# Patient Record
Sex: Female | Born: 1990
Health system: Southern US, Community
[De-identification: ages and names within clinical notes are randomized; demographics above are authoritative.]

## PROBLEM LIST (undated history)

## (undated) DIAGNOSIS — F32A Depression, unspecified: Secondary | ICD-10-CM

## (undated) DIAGNOSIS — Z3A36 36 weeks gestation of pregnancy: Secondary | ICD-10-CM

## (undated) DIAGNOSIS — F419 Anxiety disorder, unspecified: Secondary | ICD-10-CM

## (undated) DIAGNOSIS — E282 Polycystic ovarian syndrome: Secondary | ICD-10-CM

## (undated) DIAGNOSIS — M419 Scoliosis, unspecified: Secondary | ICD-10-CM

## (undated) DIAGNOSIS — N7093 Salpingitis and oophoritis, unspecified: Secondary | ICD-10-CM

## (undated) HISTORY — DX: Anxiety disorder, unspecified: F41.9

## (undated) HISTORY — DX: Depression, unspecified: F32.A

## (undated) HISTORY — DX: Salpingitis and oophoritis, unspecified: N70.93

## (undated) DEATH — deceased

---
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
## 2008-03-03 ENCOUNTER — Ambulatory Visit: Payer: Self-pay | Admitting: Gynecology

## 2008-03-06 ENCOUNTER — Encounter: Admission: RE | Admit: 2008-03-06 | Discharge: 2008-03-06 | Payer: Self-pay | Admitting: Gynecology

## 2008-03-07 ENCOUNTER — Emergency Department (HOSPITAL_COMMUNITY): Admission: EM | Admit: 2008-03-07 | Discharge: 2008-03-07 | Payer: Self-pay | Admitting: Emergency Medicine

## 2008-03-17 ENCOUNTER — Ambulatory Visit: Payer: Self-pay | Admitting: Obstetrics & Gynecology

## 2008-05-19 ENCOUNTER — Emergency Department (HOSPITAL_COMMUNITY): Admission: EM | Admit: 2008-05-19 | Discharge: 2008-05-19 | Payer: Self-pay | Admitting: Emergency Medicine

## 2008-06-14 ENCOUNTER — Emergency Department (HOSPITAL_COMMUNITY): Admission: EM | Admit: 2008-06-14 | Discharge: 2008-06-14 | Payer: Self-pay | Admitting: Emergency Medicine

## 2008-11-15 ENCOUNTER — Encounter (INDEPENDENT_AMBULATORY_CARE_PROVIDER_SITE_OTHER): Payer: Self-pay | Admitting: Gynecology

## 2008-11-15 ENCOUNTER — Ambulatory Visit: Payer: Self-pay | Admitting: Obstetrics & Gynecology

## 2008-11-15 LAB — CONVERTED CEMR LAB
Trich, Wet Prep: NONE SEEN
Yeast Wet Prep HPF POC: NONE SEEN

## 2008-12-20 ENCOUNTER — Ambulatory Visit: Payer: Self-pay | Admitting: Obstetrics & Gynecology

## 2009-01-13 ENCOUNTER — Emergency Department (HOSPITAL_COMMUNITY): Admission: EM | Admit: 2009-01-13 | Discharge: 2009-01-13 | Payer: Self-pay | Admitting: Emergency Medicine

## 2010-12-17 LAB — URINE MICROSCOPIC-ADD ON

## 2010-12-17 LAB — URINALYSIS, ROUTINE W REFLEX MICROSCOPIC
Glucose, UA: NEGATIVE mg/dL
Nitrite: NEGATIVE
Specific Gravity, Urine: 1.031 — ABNORMAL HIGH (ref 1.005–1.030)
pH: 5.5 (ref 5.0–8.0)

## 2010-12-17 LAB — GC/CHLAMYDIA PROBE AMP, GENITAL: GC Probe Amp, Genital: NEGATIVE

## 2010-12-17 LAB — WET PREP, GENITAL: Clue Cells Wet Prep HPF POC: NONE SEEN

## 2010-12-17 LAB — POCT PREGNANCY, URINE: Preg Test, Ur: NEGATIVE

## 2010-12-19 LAB — POCT PREGNANCY, URINE: Preg Test, Ur: NEGATIVE

## 2011-01-21 NOTE — Group Therapy Note (Signed)
NAMEEULENE, PEKAR NO.:  [id number]   MEDICAL RECORD NO.:  [id number]          PATIENT TYPE:  WOC   LOCATION:  [facility]                   FACILITY:  [facility]   PHYSICIAN:  Ginger Carne, MD DATE OF BIRTH:  07/28/1991   DATE OF SERVICE:  03/03/2008                                  CLINIC NOTE   Ms. Myers is a 20 year old nulligravida female who has had her  primary gynecological care at [facility], who presents today  because of irregular menses.  Also, she complains of left breast  thickening at the 3 o'clock position.  Since the age of 89, when her  menarche began, she has had her regular menses.  Her last menses was in  December 2008.  She denies galactorrhea, headaches, or other previous  diagnoses made about her amenorrheic cycles.  Although, she denies  symptomatology of excessive hair growth, she does state that she has  frequent boils in the axilla and groin area.  The patient has not been  formally treated for irregular menses.  She was offered oral  contraceptive agents at [facility] in April of this year.  However,  she declined its use because she did not wish to take pills on a daily  basis.   OB/GYN HISTORY:  The patient is nulligravida.   ALLERGIES:  Penicillin.   CONTRACEPTIVES MEDICATIONS:  None.   CURRENT MEDICATIONS:  None.   PAST SURGICAL HISTORY:  None.   PAST MEDICAL HISTORY:  None.   SOCIAL HISTORY:  The patient smokes about 1 pack of cigarettes daily and  denies alcohol or illicit drug abuse.   FAMILY HISTORY:  No first degree relatives with breast, colon, ovarian,  or uterine carcinoma.  Her mother has type 2 diabetes mellitus.   REVIEW OF SYSTEMS:  A 14-point comprehensive review of systems is within  normal limits.   PHYSICAL EXAMINATION:  VITAL SIGNS:  Blood pressure 123/80, weight 240  pounds, height 5 feet 3-1/2-inches, pulse 82 and regular.  Temperature  98.0.  HEENT:  Grossly normal.  BREAST:   Reveals no obvious thickenings of either breast.  The patient,  at this time, states that she cannot palpate a left lateral breast  thickening.  She states that she has felt this thickening yesterday.  Otherwise, there is no evidence for masses, discharge, thickenings, or  tenderness bilaterally.  CHEST:  Clear to percussion and auscultation.  CARDIAC:  Without murmurs or enlargements.  Regular rate and rhythm.  EXTREMITIES, LYMPHATICS, SKIN, NEUROLOGICAL, AND MUSCULOSKELETAL  SYSTEMS:  Normal.  There is no evidence for gross hirsutism.  However,  there is presence of groin boils.  These are not pustular, however, they  are indurated.  PELVIC EXAM:  The patient had a Pap smear at [facility].  Wet prep  obtained, due to patient stating she has had a discharge recently.  External genitalia, vulva, and vagina are normal.  Cervix is smooth  without erosions or lesions.  Uterus is difficult to palpate, but  appears normal as to both adnexa.   IMPRESSION:  1. Questionable left breast thickening.  A pelvic sonogram was ordered  to identify any possibility of masses with thickenings at the 3      o'clock position of the left breast.  2. Irregular menstrual cycles.  The patient appears to have polycystic      ovarian syndrome, but has not been appropriately worked up today.      I have ordered the 17-hydroxyprogesterone level, total testosterone      level, TSH, prolactin, and a serum HCG.  I have held off ordering a      insulin-resistance study at this time.   PLAN:  I discussed with the patient the importance of having regular  menses and risks of uterine cancer to down the road.  At this point, she  is not ready to start oral contraceptives, but I have asked her to think  about it and return in 2 weeks after her testings is back to discuss  further options.  I am uncomfortable giving her every other month  progesterone for withdrawal bleeds due to not having a good method of   contraception.  She might be a candidate for Mirena IUD, if she does not  wish to engage in a contraceptive management.           ______________________________  Ginger Carne, MD     SHB/MEDQ  D:  03/03/2008  T:  03/04/2008  Job:  [id number]

## 2011-01-21 NOTE — Group Therapy Note (Signed)
NAMECAMRYN, Brittany Lindsey            ACCOUNT NO.:  [id number]   MEDICAL RECORD NO.:  [id number]          PATIENT TYPE:  WOC   LOCATION:  [facility]                   FACILITY:  [facility]   PHYSICIAN:  Allie Bossier, MD        DATE OF BIRTH:  1991-05-02   DATE OF SERVICE:  11/15/2008                                  CLINIC NOTE   Marthella is a 20 year old single Hispanic gravida zero who comes in  complaining of a watery vaginal discharge.  She says it is almost like  sweat, she describes it as having a bad odor.  Her second complaint is  that of decreased libido for the last year.  Workup for her  oligomenorrhea included a testosterone level that was elevated at 58  (upper limit of normal is 40).  The remainder of her hormone workup for  that issue was within normal limits.  On exam she certainly does have a  watery discharge, I do not appreciate a foul odor.  There is no  leukorrhea.  I did send a Wet prep and GC and Chlamydia cultures.  Also  when I did a review of systems it is apparent that she only had 4  periods last year.  She has not been sexually active since November of  2009 but she is in a relationship with a fellow/boyfriend.   ASSESSMENT AND PLAN:  1. Watery vaginal discharge.  I have been cultures and a Wet prep,      results will be followed up as soon as they are available.  2. Oligomenorrhea.  I have discussed with her that she needs to have      at least 6 periods per year to prevent uterine hyperplasia and      possible development of uterine cancer in the future.  I therefore      placed her on Yaz, given her a prescription for Yaz.  I have also      counseled her about not using ibuprofen while on this medicine.      The Dianah Field will serve 3 effects in my mind, it will hopefully bring      her testosterone back into the normal range, it will provide birth      control should she begin having sexual intercourse and number three      is will cause her to have withdrawal  bleeding and thereby decrease      her risk of uterine hyperplasia.  She will come back next month for      a blood pressure check.      Allie Bossier, MD     MCD/MEDQ  D:  11/15/2008  T:  11/16/2008  Job:  [id number]

## 2011-01-21 NOTE — Group Therapy Note (Signed)
NAMETANEIA, MEALOR            ACCOUNT NO.:  [id number]   MEDICAL RECORD NO.:  [id number]          PATIENT TYPE:  WOC   LOCATION:  [facility]                   [facility]AC[facility]   PHYSICIAN:  Elsie Lincoln, MD      DATE OF BIRTH:  1991/01/15   DATE OF SERVICE:                                  CLINIC NOTE   REASON FOR VISIT:  A 2-week followup for some laboratory values from  amenorrhea.   SUBJECTIVE:  Brittany Lindsey is a 20 year old female who has not had a regular  period since approximately December 2008.  She presents today for  followup of her laboratory assessment and ultrasound to discuss options  to get her periods regular.  She currently denies trying to get  pregnant.  She does endorse some vaginal dryness and poor libido at this  time.  She also addresses feeling somewhat down.  Otherwise, she has no  complaints today.  She is interested in talking about her options to get  her periods more regular and she is interested in both oral  contraceptives and Mirena IUD.   OBJECTIVE:  Temperature is 97.0, pulse 77, blood pressure 124/79, weight  is 240.5 pounds, height 5 feet 3-1/2 inches, and respiratory rate of 20.  She is a pleasant, overweight female in no apparent distress.   ASSESSMENT:  Ms. Robers is a 20 year old female with amenorrhea with  need for withdrawal bleeding.   PLAN:  We have discussed the options for the patient and she is  interested in starting birth control pill at this time.  We would like  her to start Yasmin, as just a monophasic pill.  We will have the  patient follow up in 2 to 3 months to make sure this is going well for  her.  She asks what she should do should she not have a withdrawal bleed  on this and it was recommended that she call the clinic to let them  know, so perhaps she could be seen sooner.  She was apprised that she  needs to take this pill at the same time everyday.           ______________________________  Elsie Lincoln,  MD     KL/MEDQ  D:  03/17/2008  T:  03/18/2008  Job:  [id number]

## 2011-01-21 NOTE — Group Therapy Note (Signed)
Brittany Lindsey, Brittany Lindsey            ACCOUNT NO.:  [id number]   MEDICAL RECORD NO.:  [id number]          PATIENT TYPE:  WOC   [facility]ATION[facility]                   [facility]ACILITY:  [facility]   PHYSICIAN:  Caren Griffins, CNM       DATE OF BIRTH:  24-Apr-1991   DATE OF SERVICE:  12/20/2008                                  CLINIC NOTE   REASON FOR VISIT:  Follow-up blood pressure and  follow-up  oligomenorrhea.   HISTORY:  This is a 20 year old nulliparous female who was seen by Dr.  Marice Potter about a month ago for a vaginal discharge that she had described as  watery and malodorous.  Also, she had been having oligomenorrhea worked  up by [place] starting in July 2009.  At that time, she was initially started  on Yasmin.  She states she is now taking Yaz.  She is also concerned  about decreased libido which has been going on for at least a year. She  is very busy going to school and has been in a mutually monogamous  relationship for the past few months although she has had quite a few  lifetime partners.  She denies any vaginal discharge or malodor.  She  did have a normal period on December 16, 2008.  She last had intercourse  during her menses and states that she began having pain with deep  penetration and also when she was using a tampon.  There was a local  area that was uncomfortable deep in the vagina and over that same  weekend she had dyspareunia and describes that it felt like he hit  something again deep in the vagina.  She states that it was the usual  position for her which was supine.  Her last Pap was normal December 15, 2008 at [facility].  She states all her Paps have been normal and she  has had several, but this history is uncertain and she is only 20 years  old.  She has a family history of diabetes in her mother and grandfather  and would like to be checked for diabetes herself.   OBJECTIVE:  BP 124/80, weight is 235 pounds, height 5 foot and 3-1/2  inches.  GENERAL:  Obese, NAD.  No  facial hirsutism.  ABDOMEN:  Obese, nontender.  PELVIC:  Shaved mons.  NEFG.  BUS negative.  Vagina:  No lesions or  masses.  The speculum is rotated.  She does have mild discomfort from  the speculum blade anteriorly.  Cervix is clean.  Physiologic discharge.  Uterus:  No tenderness or masses. Adnexa:  No tenderness or masses.   ASSESSMENT:  1. Obesity and oligomenorrhea responding to Yaz.  2. Normal pelvic exam with subjective dyspareunia and decrease in      libido.  3. Obesity.   PLAN:  Discussed relationship issues and ways to decrease stress.  Recommended more exercise and weight reduction diet.  As requested, we  scheduled her for a 2-hour GTT.  She is advised to either not shave her  mons and  vulva or to shave in a downward motion.  She is reassured that  her  pelvic exam is essentially normal and she is advised to try a vaginal  lubricant and continue on Yaz.  Follow up here in about 6 months to see  how she is doing.           ______________________________  Caren Griffins, CNM     DP/MEDQ  D:  12/20/2008  T:  12/20/2008  Job:  [id number]

## 2011-06-13 LAB — CBC
Hemoglobin: 12.8
MCHC: 34.8
RBC: 4.48
RDW: 13.1

## 2011-06-13 LAB — URINALYSIS, ROUTINE W REFLEX MICROSCOPIC
Glucose, UA: NEGATIVE
Hgb urine dipstick: NEGATIVE
Specific Gravity, Urine: 1.034 — ABNORMAL HIGH
pH: 7

## 2011-06-13 LAB — WET PREP, GENITAL
Clue Cells Wet Prep HPF POC: NONE SEEN
Trich, Wet Prep: NONE SEEN

## 2011-06-13 LAB — PREGNANCY, URINE: Preg Test, Ur: NEGATIVE

## 2011-06-13 LAB — DIFFERENTIAL
Eosinophils Absolute: 0.1 — ABNORMAL LOW
Lymphocytes Relative: 24
Lymphs Abs: 2.4
Monocytes Absolute: 0.9
Neutrophils Relative %: 66

## 2011-06-13 LAB — URINE MICROSCOPIC-ADD ON

## 2011-06-13 LAB — URINE CULTURE: Colony Count: NO GROWTH

## 2011-06-13 LAB — RPR: RPR Ser Ql: NONREACTIVE

## 2012-07-04 ENCOUNTER — Encounter (HOSPITAL_COMMUNITY): Payer: Self-pay

## 2012-07-04 ENCOUNTER — Emergency Department (HOSPITAL_COMMUNITY)
Admission: EM | Admit: 2012-07-04 | Discharge: 2012-07-04 | Disposition: A | Discharge status: Home / Self Care | Payer: Self-pay | Attending: Emergency Medicine | Admitting: Emergency Medicine

## 2012-07-04 DIAGNOSIS — Y939 Activity, unspecified: Secondary | ICD-10-CM | POA: Insufficient documentation

## 2012-07-04 DIAGNOSIS — L02519 Cutaneous abscess of unspecified hand: Secondary | ICD-10-CM | POA: Insufficient documentation

## 2012-07-04 DIAGNOSIS — L03019 Cellulitis of unspecified finger: Secondary | ICD-10-CM | POA: Insufficient documentation

## 2012-07-04 DIAGNOSIS — W57XXXA Bitten or stung by nonvenomous insect and other nonvenomous arthropods, initial encounter: Secondary | ICD-10-CM

## 2012-07-04 DIAGNOSIS — L0390 Cellulitis, unspecified: Secondary | ICD-10-CM

## 2012-07-04 DIAGNOSIS — Y929 Unspecified place or not applicable: Secondary | ICD-10-CM | POA: Insufficient documentation

## 2012-07-04 DIAGNOSIS — L089 Local infection of the skin and subcutaneous tissue, unspecified: Secondary | ICD-10-CM | POA: Insufficient documentation

## 2012-07-04 MED ORDER — HYDROCODONE-ACETAMINOPHEN 5-325 MG PO TABS
1.0000 | ORAL_TABLET | Freq: Once | ORAL | Status: AC
  Administered 2012-07-04: 1 via ORAL
  Filled 2012-07-04: qty 1

## 2012-07-04 MED ORDER — ONDANSETRON 4 MG PO TBDP
ORAL_TABLET | ORAL | Status: AC
  Administered 2012-07-04: 4 mg
  Filled 2012-07-04: qty 1

## 2012-07-04 MED ORDER — CLINDAMYCIN HCL 150 MG PO CAPS: 450.0000 mg | ORAL_CAPSULE | Freq: Three times a day (TID) | ORAL | Status: DC

## 2012-07-04 MED ORDER — SULFAMETHOXAZOLE-TRIMETHOPRIM 800-160 MG PO TABS: 1.0000 | ORAL_TABLET | Freq: Two times a day (BID) | ORAL | Status: DC

## 2012-07-04 MED ORDER — IBUPROFEN 800 MG PO TABS: 800.0000 mg | ORAL_TABLET | Freq: Three times a day (TID) | ORAL | Status: DC

## 2012-07-04 NOTE — ED Provider Notes (Signed)
Medical screening examination/treatment/procedure(s) were performed by non-physician practitioner and as supervising physician I was immediately available for consultation/collaboration.   Maribella Kuna, MD 07/04/12 1549 

## 2012-07-04 NOTE — ED Notes (Signed)
Patient reports that she had right hand pain 3 days ago and Saturday patient noted that her right hand itched, slight redness and swelling noted. Patient states she now has pain that radiates to her shoulder and the pain is worse with movement. Patient has a radial pulse and is able to wiggle fingers, but is unable to make a fist.

## 2012-07-04 NOTE — ED Provider Notes (Signed)
History     CSN: [id number]  Arrival date & time 07/04/12  1058   First MD Initiated Contact with Patient 07/04/12 1159      Chief Complaint  Patient presents with  . Arm Pain  . Hand Pain    (Consider location/radiation/quality/duration/timing/severity/associated sxs/prior treatment) HPI Comments: Patient presents with right hand pain s/p insect bite. Patient states that she noticed "two small holes" inferior to her right 5th digit. Associated symptoms include redness and swelling of the 5th digit, itching,and pain. Patient rates the pain as 6/10 without radiation or transmission. Denies fever or chills. Denies numbness or tingling.   The history is provided by the patient. No language interpreter was used.    History reviewed. No pertinent past medical history.  History reviewed. No pertinent past surgical history.  Family History  Problem Relation Age of Onset  . Diabetes Mother   . Seizures Mother   . Hyperlipidemia Mother     History  Substance Use Topics  . Smoking status: Never Smoker   . Smokeless tobacco: Never Used  . Alcohol Use: No    OB History    Grav Para Term Preterm Abortions TAB SAB Ect Mult Living                  Review of Systems  Constitutional: Negative for fever and chills.  Musculoskeletal: Positive for arthralgias.  Skin: Positive for color change and wound.  Neurological: Negative for numbness.    Allergies  Penicillins  Home Medications   Current Outpatient Rx  Name Route Sig Dispense Refill  . CLINDAMYCIN HCL 150 MG PO CAPS Oral Take 3 capsules (450 mg total) by mouth 3 (three) times daily. May dispense as 150mg  capsules 30 capsule 0  . IBUPROFEN 800 MG PO TABS Oral Take 1 tablet (800 mg total) by mouth 3 (three) times daily. 21 tablet 0  . SULFAMETHOXAZOLE-TRIMETHOPRIM 800-160 MG PO TABS Oral Take 1 tablet by mouth every 12 (twelve) hours. 20 tablet 0    BP 125/76  Pulse 80  Temp 98.4 F (36.9 C) (Oral)  Resp 14  SpO2  99%  LMP 07/04/2012  Physical Exam  Nursing note and vitals reviewed. Constitutional: She appears well-developed and well-nourished. No distress.  HENT:  Head: Normocephalic and atraumatic.  Mouth/Throat: Oropharynx is clear and moist.  Eyes: Pupils are equal, round, and reactive to light. No scleral icterus.  Neck: Neck supple.  Cardiovascular: Normal rate, regular rhythm and normal heart sounds.   Pulmonary/Chest: Effort normal and breath sounds normal.  Abdominal: Soft. Bowel sounds are normal. There is no tenderness.  Musculoskeletal: Normal range of motion. She exhibits edema and tenderness.       Hands:      Patient has erythema and swelling of the 5th digit. There are two small puncture wounds inferior to the 5th digit. Strong radial pulse with good cap refill.  Neurological: She is alert.  Skin: Skin is warm.    ED Course  Procedures (including critical care time)  Labs Reviewed - No data to display No results found.   1. Cellulitis   2. Insect bite       MDM  Patient presented s/p insect bite to right hand. Erythema and swelling noted at the 5th digit superior to bite. Neurovascularly intact. Pain improved with pain meds in ED. Area concerning for cellulitis. Patient discharged on bactrim and keflex with return precautions.        Pixie Casino, PA-C 07/04/12 1429

## 2012-09-27 ENCOUNTER — Emergency Department (HOSPITAL_COMMUNITY)
Admission: EM | Admit: 2012-09-27 | Discharge: 2012-09-27 | Disposition: A | Discharge status: Home / Self Care | Payer: Self-pay | Attending: Emergency Medicine | Admitting: Emergency Medicine

## 2012-09-27 ENCOUNTER — Emergency Department (HOSPITAL_COMMUNITY): Payer: Self-pay

## 2012-09-27 ENCOUNTER — Encounter (HOSPITAL_COMMUNITY): Payer: Self-pay | Admitting: *Deleted

## 2012-09-27 DIAGNOSIS — R110 Nausea: Secondary | ICD-10-CM | POA: Insufficient documentation

## 2012-09-27 DIAGNOSIS — Z79899 Other long term (current) drug therapy: Secondary | ICD-10-CM | POA: Insufficient documentation

## 2012-09-27 DIAGNOSIS — R0602 Shortness of breath: Secondary | ICD-10-CM | POA: Insufficient documentation

## 2012-09-27 DIAGNOSIS — R079 Chest pain, unspecified: Secondary | ICD-10-CM | POA: Insufficient documentation

## 2012-09-27 DIAGNOSIS — Z3202 Encounter for pregnancy test, result negative: Secondary | ICD-10-CM | POA: Insufficient documentation

## 2012-09-27 DIAGNOSIS — N926 Irregular menstruation, unspecified: Secondary | ICD-10-CM | POA: Insufficient documentation

## 2012-09-27 LAB — COMPREHENSIVE METABOLIC PANEL
ALT: 22 U/L (ref 0–35)
Alkaline Phosphatase: 50 U/L (ref 39–117)
BUN: 9 mg/dL (ref 6–23)
CO2: 27 mEq/L (ref 19–32)
Chloride: 104 mEq/L (ref 96–112)
GFR calc Af Amer: >90 mL/min (ref >90)
Glucose, Bld: 105 mg/dL — ABNORMAL HIGH (ref 70–99)
Potassium: 4.1 mEq/L (ref 3.5–5.1)
Sodium: 140 mEq/L (ref 135–145)
Total Bilirubin: 0.4 mg/dL (ref 0.3–1.2)
Total Protein: 7.6 g/dL (ref 6.0–8.3)

## 2012-09-27 LAB — URINALYSIS, ROUTINE W REFLEX MICROSCOPIC
Bilirubin Urine: NEGATIVE
Glucose, UA: NEGATIVE mg/dL
Hgb urine dipstick: NEGATIVE
Ketones, ur: NEGATIVE mg/dL
Protein, ur: NEGATIVE mg/dL
Urobilinogen, UA: 0.2 mg/dL (ref 0.0–1.0)

## 2012-09-27 LAB — CBC WITH DIFFERENTIAL/PLATELET
Basophils Absolute: 0.1 10*3/uL (ref 0.0–0.1)
Lymphs Abs: 2.3 10*3/uL (ref 0.7–4.0)
MCH: 28 pg (ref 26.0–34.0)
MCHC: 33.3 g/dL (ref 30.0–36.0)
MCV: 84 fL (ref 78.0–100.0)
Monocytes Absolute: 0.6 10*3/uL (ref 0.1–1.0)
Neutro Abs: 4.5 10*3/uL (ref 1.7–7.7)
Neutrophils Relative %: 59 % (ref 43–77)
Platelets: 287 10*3/uL (ref 150–400)
RDW: 12.3 % (ref 11.5–15.5)

## 2012-09-27 NOTE — ED Notes (Signed)
Pt reports no period x 3 months, has taken pregnancy tests with negative results. ALso reports bloating in abdomen. Abdominal cramping off and on for past few months.

## 2012-09-27 NOTE — ED Provider Notes (Signed)
History  This chart was scribed for Glynn Octave, MD by Bennett Scrape, ED Scribe. This patient was seen in room WA17/WA17 and the patient's care was started at 4:03 PM.  CSN: [id number]  Arrival date & time 09/27/12  1338   First MD Initiated Contact with Patient 09/27/12 1603      Chief Complaint  Patient presents with  . Abdominal Cramping    The history is provided by the patient. No language interpreter was used.    Brittany Lindsey is a 22 y.o. female who presents to the Emergency Department complaining of 3 months of gradual onset, gradually worsening, constant umbilical abdominal pain described as fullness that occasional radiates to the LLQ with associated occasional nausea, SOB and intermittent CP. She reports that the fullness is worse with standing. She denies having CP and SOB currently. She states that her last period was in October 3013 but reports that she has taken 4 at home pregnancy tests, the last one she took this morning, that were negative. She denies having prior episodes of similar symptoms.She states that she has been moving her bowels normally and has been eating and drinking normally. She denies nausea, emesis, diarrhea, constipation, vaginal bleeding or discharge, urinary symptoms, and abdominal pain as associated symptoms. She denies being on birth control or taking daily medications. She states that she had a hormone imbalance with high testosterone 5 years ago and states that she was on birth control but states that she stopped following up with her PCP and went off the medication. She denies any prior abdominal surgeries. She reports that she is G0P0. She does not have a h/o chronic medical conditions and denies smoking and alcohol use.   No current PCP.  History reviewed. No pertinent past medical history.  History reviewed. No pertinent past surgical history.  Family History  Problem Relation Age of Onset  . Diabetes Mother   . Seizures Mother   .  Hyperlipidemia Mother     History  Substance Use Topics  . Smoking status: Never Smoker   . Smokeless tobacco: Never Used  . Alcohol Use: No    No OB history provided.  Review of Systems  A complete 10 system review of systems was obtained and all systems are negative except as noted in the HPI and PMH.   Allergies  Mushroom extract complex and Penicillins  Home Medications  No current outpatient prescriptions on file.  Triage Vitals: BP 133/52  Pulse 85  Temp 98.4 F (36.9 C) (Oral)  Resp 16  SpO2 100%  Physical Exam  Nursing note and vitals reviewed. Constitutional: She is oriented to person, place, and time. She appears well-developed and well-nourished. No distress.  HENT:  Head: Normocephalic and atraumatic.  Mouth/Throat: Oropharynx is clear and moist.  Eyes: Conjunctivae normal and EOM are normal. Pupils are equal, round, and reactive to light.  Neck: Neck supple. No tracheal deviation present.  Cardiovascular: Normal rate, regular rhythm and intact distal pulses.   Pulmonary/Chest: Effort normal and breath sounds normal. No respiratory distress.  Abdominal: Soft. Bowel sounds are normal. There is no tenderness.       No appreciable hernias, no CVA tenderness  Musculoskeletal: Normal range of motion. She exhibits no edema.  Neurological: She is alert and oriented to person, place, and time.  Skin: Skin is warm and dry.  Psychiatric: She has a normal mood and affect. Her behavior is normal.    ED Course  Procedures (including critical care time)  DIAGNOSTIC  STUDIES: Oxygen Saturation is 100% on room air, normal by my interpretation.    COORDINATION OF CARE: 4:13 PM- Informed pt of negative pregnancy test in the ED today. Discussed treatment plan which includes CXR, CBC panel, and UA with pt at bedside and pt agreed to plan.   5:17 PM- Advised pt of radiology and lab work results. Discussed discharge plan with pt and pt agreed to plan. Also advised pt to  follow up with Gynecology and pt agreed.  Labs Reviewed  COMPREHENSIVE METABOLIC PANEL - Abnormal; Notable for the following:    Glucose, Bld 105 (*)     All other components within normal limits  URINALYSIS, ROUTINE W REFLEX MICROSCOPIC  CBC WITH DIFFERENTIAL  POCT PREGNANCY, URINE  HCG, QUANTITATIVE, PREGNANCY  LIPASE, BLOOD   Dg Abd Acute W/chest  09/27/2012  *RADIOLOGY REPORT*  Clinical Data: Nausea and abdominal pain.  ACUTE ABDOMEN SERIES (ABDOMEN 2 VIEW & CHEST 1 VIEW)  Comparison: CT of the abdomen on 12/10/2004  Findings: The lungs are clear and the heart size is normal.  There is a leftward convex scoliosis of the thoracic spine.  The abdominal films show a normal bowel gas pattern without evidence of obstruction.  No free air or abnormal calcifications are identified.  IMPRESSION: Normal abdominal series.   Original Report Authenticated By: Irish Lack, M.D.      No diagnosis found.    MDM  Intermittent upper abdominal "fullness" for the past several months with no period since October. Printed test negative at home. Denies abdominal pain, nausea, vomiting, fever chills. No vaginal bleeding or discharge. No urinary symptoms.  Patient appears well. Her abdominal exam is benign. Her pregnancy test is negative. There is no UTI. She'll need to establish care with gynecology to address her irregular periods.      I personally performed the services described in this documentation, which was scribed in my presence. The recorded information has been reviewed and is accurate.    Glynn Octave, MD 09/27/12 1949

## 2012-10-23 ENCOUNTER — Other Ambulatory Visit: Payer: Self-pay

## 2013-07-14 ENCOUNTER — Other Ambulatory Visit: Payer: Self-pay

## 2013-12-30 ENCOUNTER — Ambulatory Visit (INDEPENDENT_AMBULATORY_CARE_PROVIDER_SITE_OTHER): Payer: BC Managed Care – PPO | Admitting: Licensed Clinical Social Worker

## 2013-12-30 DIAGNOSIS — F411 Generalized anxiety disorder: Secondary | ICD-10-CM

## 2013-12-30 DIAGNOSIS — F331 Major depressive disorder, recurrent, moderate: Secondary | ICD-10-CM

## 2014-01-20 ENCOUNTER — Ambulatory Visit: Payer: BC Managed Care – PPO | Admitting: Licensed Clinical Social Worker

## 2014-02-09 ENCOUNTER — Telehealth: Payer: Self-pay

## 2014-02-09 NOTE — Telephone Encounter (Signed)
Appointment confirmed.  Pt was unable to complete pre visit call due to being at work and unable to talk on the phone.

## 2014-02-10 ENCOUNTER — Encounter: Payer: Self-pay | Admitting: Family Medicine

## 2014-02-10 ENCOUNTER — Ambulatory Visit (INDEPENDENT_AMBULATORY_CARE_PROVIDER_SITE_OTHER): Payer: BC Managed Care – PPO | Admitting: Family Medicine

## 2014-02-10 VITALS — BP 110/80 | HR 88 | Temp 98.2°F | Resp 16 | Ht 65.0 in | Wt 259.5 lb

## 2014-02-10 DIAGNOSIS — L0293 Carbuncle, unspecified: Secondary | ICD-10-CM

## 2014-02-10 DIAGNOSIS — R7309 Other abnormal glucose: Secondary | ICD-10-CM

## 2014-02-10 DIAGNOSIS — R7303 Prediabetes: Secondary | ICD-10-CM | POA: Insufficient documentation

## 2014-02-10 DIAGNOSIS — L0292 Furuncle, unspecified: Secondary | ICD-10-CM

## 2014-02-10 DIAGNOSIS — E282 Polycystic ovarian syndrome: Secondary | ICD-10-CM | POA: Insufficient documentation

## 2014-02-10 LAB — LIPID PANEL
CHOL/HDL RATIO: 3.6 ratio
Cholesterol: 130 mg/dL (ref 0–200)
HDL: 36 mg/dL — AB (ref >39)
LDL Cholesterol: 53 mg/dL (ref 0–99)
TRIGLYCERIDES: 205 mg/dL — AB (ref <150)
VLDL: 41 mg/dL — ABNORMAL HIGH (ref 0–40)

## 2014-02-10 LAB — HEPATIC FUNCTION PANEL
ALK PHOS: 55 U/L (ref 39–117)
ALT: 28 U/L (ref 0–35)
AST: 22 U/L (ref 0–37)
Albumin: 4 g/dL (ref 3.5–5.2)
BILIRUBIN DIRECT: 0.1 mg/dL (ref 0.0–0.3)
Indirect Bilirubin: 0.4 mg/dL (ref 0.2–1.2)
Total Bilirubin: 0.5 mg/dL (ref 0.2–1.2)
Total Protein: 6.8 g/dL (ref 6.0–8.3)

## 2014-02-10 LAB — CBC WITH DIFFERENTIAL/PLATELET
BASOS ABS: 0.1 10*3/uL (ref 0.0–0.1)
BASOS PCT: 1 % (ref 0–1)
EOS ABS: 0.1 10*3/uL (ref 0.0–0.7)
EOS PCT: 1 % (ref 0–5)
HCT: 37.3 % (ref 36.0–46.0)
Hemoglobin: 13.1 g/dL (ref 12.0–15.0)
Lymphocytes Relative: 29 % (ref 12–46)
Lymphs Abs: 2.6 10*3/uL (ref 0.7–4.0)
MCH: 28.4 pg (ref 26.0–34.0)
MCHC: 35.1 g/dL (ref 30.0–36.0)
MCV: 80.7 fL (ref 78.0–100.0)
MONO ABS: 0.6 10*3/uL (ref 0.1–1.0)
Monocytes Relative: 7 % (ref 3–12)
Neutro Abs: 5.5 10*3/uL (ref 1.7–7.7)
Neutrophils Relative %: 62 % (ref 43–77)
PLATELETS: 279 10*3/uL (ref 150–400)
RBC: 4.62 MIL/uL (ref 3.87–5.11)
RDW: 13.5 % (ref 11.5–15.5)
WBC: 8.8 10*3/uL (ref 4.0–10.5)

## 2014-02-10 LAB — BASIC METABOLIC PANEL
BUN: 9 mg/dL (ref 6–23)
CALCIUM: 8.8 mg/dL (ref 8.4–10.5)
CHLORIDE: 106 meq/L (ref 96–112)
CO2: 26 meq/L (ref 19–32)
Creat: 0.79 mg/dL (ref 0.50–1.10)
Glucose, Bld: 100 mg/dL — ABNORMAL HIGH (ref 70–99)
Potassium: 3.9 mEq/L (ref 3.5–5.3)
SODIUM: 139 meq/L (ref 135–145)

## 2014-02-10 LAB — TSH: TSH: 1.198 u[IU]/mL (ref 0.350–4.500)

## 2014-02-10 MED ORDER — MUPIROCIN CALCIUM 2 % EX CREA: 1.0000 "application " | TOPICAL_CREAM | Freq: Two times a day (BID) | CUTANEOUS | Status: DC

## 2014-02-10 NOTE — Patient Instructions (Signed)
Follow up in 3-4 months to recheck weight loss progress We'll call you with your GYN and nutrition appts Use the Mupirocin twice daily if you develop any boils Try and make healthy food choices and attempt to get regular exercise We'll notify you of your lab results and make any changes if needed Call with any questions or concerns Have a great weekend!!

## 2014-02-10 NOTE — Progress Notes (Signed)
   Subjective:    Patient ID: Brittany Lindsey, female    DOB: Dec 14, 1990, 23 y.o.   MRN: [id number]  HPI New to establish.  Previous MD- no PCP.  GYN- [facility], uncertain as to date of last pap  Prediabetes- pt reports A1C was 5.6-5.7.  Was started on metformin 500mg  BID.  Strong family hx of DM.  Pt has not taken meds since October.  PCOS- diagnosed during fertility work up.  Will have intermittent abd pain due to ovarian cysts.  Periods are irregular.  LMP 5/20, prior to that was Sept 2014.  Obesity- not following any particular diet.  Not exercising.  'i can't afford to eat right'.  Recurrent boils- 1st started ~age 77.  Will pop them on her own.  Doesn't seek medical treatment.  Tend to occur in armpits, groin, under breasts, and in gluteal cleft.   Review of Systems For ROS see HPI     Objective:   Physical Exam  Vitals reviewed. Constitutional: She is oriented to person, place, and time. She appears well-developed and well-nourished. No distress.  obese  HENT:  Head: Normocephalic and atraumatic.  hirsute  Eyes: Conjunctivae and EOM are normal. Pupils are equal, round, and reactive to light.  Neck: Normal range of motion. Neck supple. No thyromegaly present.  Cardiovascular: Normal rate, regular rhythm, normal heart sounds and intact distal pulses.   No murmur heard. Pulmonary/Chest: Effort normal and breath sounds normal. No respiratory distress.  Abdominal: Soft. She exhibits no distension. There is no tenderness.  Musculoskeletal: She exhibits no edema.  Lymphadenopathy:    She has no cervical adenopathy.  Neurological: She is alert and oriented to person, place, and time.  Skin: Skin is warm and dry.  Psychiatric: She has a normal mood and affect. Her behavior is normal.          Assessment & Plan:

## 2014-02-10 NOTE — Progress Notes (Signed)
Pre visit review using our clinic review tool, if applicable. No additional management support is needed unless otherwise documented below in the visit note. 

## 2014-02-11 LAB — HEMOGLOBIN A1C
HEMOGLOBIN A1C: 5.5 % (ref <5.7)
Mean Plasma Glucose: 111 mg/dL (ref <117)

## 2014-02-12 NOTE — Assessment & Plan Note (Signed)
New to provider, ongoing for pt.  Was previously on metformin- not currently.  Not following ADA diet or exercising- stressed the need for both.  Will refer to nutrition.  Check A1C.  Will follow.

## 2014-02-12 NOTE — Assessment & Plan Note (Signed)
New to provider.  Encouraged regular, aerobic activity for 30 minutes at least 4x/week and monitoring caloric intake w/ the help of MyFitnessPal app.

## 2014-02-12 NOTE — Assessment & Plan Note (Signed)
New to provider, ongoing for pt.  Not currently on meds- previously on metformin.  Refer to GYN for ongoing management.

## 2014-02-12 NOTE — Assessment & Plan Note (Signed)
New to provider.  No active lesions present.  Pt given script for mupirocin to use prn along w/ instructions on when to seek medical tx.  Pt expressed understanding and is in agreement w/ plan.

## 2014-02-13 ENCOUNTER — Telehealth: Payer: Self-pay | Admitting: Family Medicine

## 2014-02-13 MED ORDER — METFORMIN HCL 500 MG PO TABS: 500.0000 mg | ORAL_TABLET | Freq: Two times a day (BID) | ORAL | Status: DC

## 2014-02-13 NOTE — Telephone Encounter (Signed)
Med filled will notify pt.  

## 2014-02-13 NOTE — Telephone Encounter (Signed)
Ok for Metformin 500mg  BID #60, 6 refills

## 2014-02-13 NOTE — Telephone Encounter (Signed)
Pt states that when to took Metformin in the past it helped with her PCOS, prediabetes and weight management (she was able to lose a significant amount of weight--20lbs).  If Dr. Beverely Low agrees, she would like to placed on this medication again.  Please advise.

## 2014-02-13 NOTE — Telephone Encounter (Signed)
Patient would like to know if Dr. Beverely Low will write her metformin 500mg .

## 2014-03-29 ENCOUNTER — Ambulatory Visit: Payer: Self-pay | Admitting: Dietician

## 2014-05-12 ENCOUNTER — Ambulatory Visit: Payer: BC Managed Care – PPO | Admitting: Family Medicine

## 2014-09-25 ENCOUNTER — Encounter (HOSPITAL_COMMUNITY): Payer: Self-pay | Admitting: Emergency Medicine

## 2014-09-25 ENCOUNTER — Emergency Department (HOSPITAL_COMMUNITY)
Admission: EM | Admit: 2014-09-25 | Discharge: 2014-09-26 | Disposition: A | Discharge status: Home / Self Care | Payer: Self-pay | Attending: Emergency Medicine | Admitting: Emergency Medicine

## 2014-09-25 DIAGNOSIS — E119 Type 2 diabetes mellitus without complications: Secondary | ICD-10-CM | POA: Insufficient documentation

## 2014-09-25 DIAGNOSIS — H9213 Otorrhea, bilateral: Secondary | ICD-10-CM | POA: Insufficient documentation

## 2014-09-25 DIAGNOSIS — Z880 Allergy status to penicillin: Secondary | ICD-10-CM | POA: Insufficient documentation

## 2014-09-25 DIAGNOSIS — H6593 Unspecified nonsuppurative otitis media, bilateral: Secondary | ICD-10-CM

## 2014-09-25 DIAGNOSIS — Z79899 Other long term (current) drug therapy: Secondary | ICD-10-CM | POA: Insufficient documentation

## 2014-09-25 DIAGNOSIS — J019 Acute sinusitis, unspecified: Secondary | ICD-10-CM | POA: Insufficient documentation

## 2014-09-25 MED ORDER — OXYMETAZOLINE HCL 0.05 % NA SOLN
1.0000 | Freq: Once | NASAL | Status: AC
  Administered 2014-09-26: 1 via NASAL
  Filled 2014-09-25: qty 15

## 2014-09-25 NOTE — ED Provider Notes (Signed)
CSN: [id number]     Arrival date & time 09/25/14  2243 History  This chart was scribed for non-physician practitioner, Antony MaduraKelly Makailyn Mccormick, PA-C working with Ward GivensIva L Knapp, MD by Freida Busmaniana Omoyeni, ED Scribe. This patient was seen in room WTR7/WTR7 and the patient's care was started at 11:46 PM.   Chief Complaint  Patient presents with  . Otalgia    The history is provided by the patient. No language interpreter was used.    HPI Comments:  Brittany Lindsey is a 24 y.o. female who presents to the Emergency Department complaining of otalgia and decreased hearing for 1 week. She notes symptoms started in her right ear but has been an issue in her bilateral ears for 3 days. She reports sharp pain in her right ear and itching to the left. She also reports associated nasal congestion and cough. She denies drainage from her ears, fever and sore throat. She has taken Robitussin and other OTC meds without relief. She states she has a h/o sinus issues and  notes a h/o similar symptoms which she attributed to her sinus problems.     Past Medical History  Diagnosis Date  . Diabetes mellitus without complication     pre diabetic   History reviewed. No pertinent past surgical history. Family History  Problem Relation Age of Onset  . Diabetes Mother   . Seizures Mother   . Hyperlipidemia Mother    History  Substance Use Topics  . Smoking status: Never Smoker   . Smokeless tobacco: Never Used  . Alcohol Use: No   OB History    No data available      Review of Systems  Constitutional: Negative for fever and chills.  HENT: Positive for congestion and ear pain. Negative for ear discharge and sore throat.   Respiratory: Positive for cough.   All other systems reviewed and are negative.   Allergies  Mushroom extract complex and Penicillins  Home Medications   Prior to Admission medications   Medication Sig Start Date End Date Taking? Authorizing Provider  Chlorphen-Pseudoephed-APAP (TYLENOL ALLERGY  SINUS PO) Take 1 tablet by mouth 2 (two) times daily.   Yes Historical Provider, MD  doxycycline (VIBRAMYCIN) 100 MG capsule Take 1 capsule (100 mg total) by mouth 2 (two) times daily. 09/26/14   Antony MaduraKelly Velia Pamer, PA-C  metFORMIN (GLUCOPHAGE) 500 MG tablet Take 1 tablet (500 mg total) by mouth 2 (two) times daily with a meal. 02/13/14   Sheliah HatchKatherine E Tabori, MD  mupirocin cream (BACTROBAN) 2 % Apply 1 application topically 2 (two) times daily. Patient not taking: Reported on 09/25/2014 02/10/14   Sheliah HatchKatherine E Tabori, MD  sodium chloride (OCEAN) 0.65 % SOLN nasal spray Place 1 spray into both nostrils as needed for congestion. 09/26/14   Antony MaduraKelly Teresita Fanton, PA-C   BP 147/81 mmHg  Pulse 85  Temp(Src) 98.2 F (36.8 C) (Oral)  Resp 17  SpO2 100%  LMP  (LMP Unknown)   Physical Exam  Constitutional: She is oriented to person, place, and time. She appears well-developed and well-nourished. No distress.  Nontoxic/nonseptic appearing  HENT:  Head: Normocephalic and atraumatic.  Right Ear: External ear normal. No drainage, swelling or tenderness. Tympanic membrane is erythematous (moderate). Tympanic membrane is not perforated, not retracted and not bulging. A middle ear effusion is present.  Left Ear: External ear normal. No drainage, swelling or tenderness. Tympanic membrane is not perforated, not erythematous, not retracted and not bulging. A middle ear effusion is present.  Nose: No rhinorrhea,  septal deviation or nasal septal hematoma.  Mouth/Throat: Oropharynx is clear and moist. No oropharyngeal exudate.  Hearing grossly intact bilaterally. Middle ear effusion noted b/l. Cone of light obscured in R>L TM. R TM is more erythematous as compared to the left. No blood in the middle ear. Finger rubbing heard to 2 feet b/l. Uvula midline. Oropharynx clear. Patient tolerating secretions without difficulty.  Eyes: Conjunctivae and EOM are normal. No scleral icterus.  Neck: Normal range of motion.  No nuchal rigidity or  meningismus  Pulmonary/Chest: Effort normal. No respiratory distress.  Respirations even and unlabored  Musculoskeletal: Normal range of motion.  Lymphadenopathy:    She has no cervical adenopathy.  Neurological: She is alert and oriented to person, place, and time. She exhibits normal muscle tone. Coordination normal.  GCS 15. No focal neurologic deficits appreciated. Patient ambulatory with steady gait.  Skin: Skin is warm and dry. No rash noted. She is not diaphoretic. No erythema. No pallor.  Psychiatric: She has a normal mood and affect. Her behavior is normal.  Nursing note and vitals reviewed.   ED Course  Procedures   DIAGNOSTIC STUDIES:  Oxygen Saturation is 100% on RA, normal by my interpretation.    COORDINATION OF CARE:  11:51 PM Will give pt nasal spray and afrin. Discussed treatment plan with pt at bedside and pt agreed to plan.  Labs Review Labs Reviewed - No data to display  Imaging Review No results found.   EKG Interpretation None      MDM   Final diagnoses:  Acute sinusitis, recurrence not specified, unspecified location  Middle ear effusion, bilateral    Patient complaining of symptoms of sinusitis including nasal congestion, b/l otalgia of a pressure sensation, and cough. She reports a hx of sinus infections. No fever, nuchal rigidity, or meningismus to suggest meningitis. No evidence of mastoiditis. Oropharynx clear and patient tolerating secretions without difficulty. Suspect that hearing changes are direct result of patient's sinus congestion. We'll manage symptoms of sinusitis with Doxycycline course, Afrin, Ocean nasal spray, and OTC remedies. Patient given referral to ENT should symptoms persist. Return precautions provided and patient agreeable to plan with no unaddressed concerns.  I personally performed the services described in this documentation, which was scribed in my presence. The recorded information has been reviewed and is  accurate.   Filed Vitals:   09/25/14 2252  BP: 147/81  Pulse: 85  Temp: 98.2 F (36.8 C)  TempSrc: Oral  Resp: 17  SpO2: 100%      Antony Madura, PA-C 09/26/14 0435  Ward Givens, MD 09/26/14 [phone]

## 2014-09-25 NOTE — ED Notes (Signed)
Pt c/o bilateral ear pain (more so in right ear-described as a sharp pain and left ear described as "discomfort" and "itching") and says, "I can't hear out of either ear, it's like I'm walking around in a tunnel." Denies drainage, dizziness but has had some loss of balance. Has not tried any OTC medications. Also feels sinus congestion and burning in her nose. Pt states that she cannot hear and she works in a call center. No other issues/concerns.

## 2014-09-26 MED ORDER — DOXYCYCLINE HYCLATE 100 MG PO CAPS: 100.0000 mg | ORAL_CAPSULE | Freq: Two times a day (BID) | ORAL | Status: DC

## 2014-09-26 MED ORDER — SALINE SPRAY 0.65 % NA SOLN: 1.0000 | NASAL | Status: DC | PRN

## 2014-09-26 NOTE — ED Notes (Signed)
Pt reports bila otalgia x 1 week.  Denies fever.  Pt reports cough last week with phlegm.

## 2014-09-26 NOTE — Discharge Instructions (Signed)
Recommend doxycycline as prescribed. Also recommend Afrin. You may use 1 spray in each nostril daily for an additional 2 days. Do not use this medication for one or than 72 hours. Use Ocean saline nasal spray as needed for congestion. You may also try the use of seasonal allergy medications. Follow-up with an ENT doctor if symptoms persist. Return as needed if symptoms worsen.  Sinusitis Sinusitis is redness, soreness, and inflammation of the paranasal sinuses. Paranasal sinuses are air pockets within the bones of your face (beneath the eyes, the middle of the forehead, or above the eyes). In healthy paranasal sinuses, mucus is able to drain out, and air is able to circulate through them by way of your nose. However, when your paranasal sinuses are inflamed, mucus and air can become trapped. This can allow bacteria and other germs to grow and cause infection. Sinusitis can develop quickly and last only a short time (acute) or continue over a long period (chronic). Sinusitis that lasts for more than 12 weeks is considered chronic.  CAUSES  Causes of sinusitis include:  Allergies.  Structural abnormalities, such as displacement of the cartilage that separates your nostrils (deviated septum), which can decrease the air flow through your nose and sinuses and affect sinus drainage.  Functional abnormalities, such as when the small hairs (cilia) that line your sinuses and help remove mucus do not work properly or are not present. SIGNS AND SYMPTOMS  Symptoms of acute and chronic sinusitis are the same. The primary symptoms are pain and pressure around the affected sinuses. Other symptoms include:  Upper toothache.  Earache.  Headache.  Bad breath.  Decreased sense of smell and taste.  A cough, which worsens when you are lying flat.  Fatigue.  Fever.  Thick drainage from your nose, which often is green and may contain pus (purulent).  Swelling and warmth over the affected  sinuses. DIAGNOSIS  Your health care provider will perform a physical exam. During the exam, your health care provider may:  Look in your nose for signs of abnormal growths in your nostrils (nasal polyps).  Tap over the affected sinus to check for signs of infection.  View the inside of your sinuses (endoscopy) using an imaging device that has a light attached (endoscope). If your health care provider suspects that you have chronic sinusitis, one or more of the following tests may be recommended:  Allergy tests.  Nasal culture. A sample of mucus is taken from your nose, sent to a lab, and screened for bacteria.  Nasal cytology. A sample of mucus is taken from your nose and examined by your health care provider to determine if your sinusitis is related to an allergy. TREATMENT  Most cases of acute sinusitis are related to a viral infection and will resolve on their own within 10 days. Sometimes medicines are prescribed to help relieve symptoms (pain medicine, decongestants, nasal steroid sprays, or saline sprays).  However, for sinusitis related to a bacterial infection, your health care provider will prescribe antibiotic medicines. These are medicines that will help kill the bacteria causing the infection.  Rarely, sinusitis is caused by a fungal infection. In theses cases, your health care provider will prescribe antifungal medicine. For some cases of chronic sinusitis, surgery is needed. Generally, these are cases in which sinusitis recurs more than 3 times per year, despite other treatments. HOME CARE INSTRUCTIONS   Drink plenty of water. Water helps thin the mucus so your sinuses can drain more easily.  Use a humidifier.  Inhale steam 3 to 4 times a day (for example, sit in the bathroom with the shower running).  Apply a warm, moist washcloth to your face 3 to 4 times a day, or as directed by your health care provider.  Use saline nasal sprays to help moisten and clean your  sinuses.  Take medicines only as directed by your health care provider.  If you were prescribed either an antibiotic or antifungal medicine, finish it all even if you start to feel better. SEEK IMMEDIATE MEDICAL CARE IF:  You have increasing pain or severe headaches.  You have nausea, vomiting, or drowsiness.  You have swelling around your face.  You have vision problems.  You have a stiff neck.  You have difficulty breathing. MAKE SURE YOU:   Understand these instructions.  Will watch your condition.  Will get help right away if you are not doing well or get worse. Document Released: 08/25/2005 Document Revised: 01/09/2014 Document Reviewed: 09/09/2011 [facility] Patient Information 2015 [employer] [place]. This information is not intended to replace advice given to you by your health care provider. Make sure you discuss any questions you have with your health care provider.

## 2014-10-09 DIAGNOSIS — N7093 Salpingitis and oophoritis, unspecified: Secondary | ICD-10-CM

## 2014-10-09 HISTORY — DX: Salpingitis and oophoritis, unspecified: N70.93

## 2014-10-12 ENCOUNTER — Emergency Department (HOSPITAL_COMMUNITY): Payer: Self-pay

## 2014-10-12 ENCOUNTER — Encounter (HOSPITAL_COMMUNITY): Payer: Self-pay | Admitting: Emergency Medicine

## 2014-10-12 ENCOUNTER — Inpatient Hospital Stay (HOSPITAL_COMMUNITY)
Admission: EM | Admit: 2014-10-12 | Discharge: 2014-10-19 | DRG: 759 | Disposition: A | Discharge status: Home / Self Care | Payer: Self-pay | Attending: Family Medicine | Admitting: Family Medicine

## 2014-10-12 DIAGNOSIS — N7093 Salpingitis and oophoritis, unspecified: Principal | ICD-10-CM

## 2014-10-12 DIAGNOSIS — D72829 Elevated white blood cell count, unspecified: Secondary | ICD-10-CM | POA: Diagnosis present

## 2014-10-12 DIAGNOSIS — R509 Fever, unspecified: Secondary | ICD-10-CM

## 2014-10-12 DIAGNOSIS — R1031 Right lower quadrant pain: Secondary | ICD-10-CM

## 2014-10-12 DIAGNOSIS — N739 Female pelvic inflammatory disease, unspecified: Secondary | ICD-10-CM | POA: Diagnosis present

## 2014-10-12 DIAGNOSIS — E119 Type 2 diabetes mellitus without complications: Secondary | ICD-10-CM | POA: Diagnosis present

## 2014-10-12 DIAGNOSIS — E282 Polycystic ovarian syndrome: Secondary | ICD-10-CM | POA: Diagnosis present

## 2014-10-12 DIAGNOSIS — Z880 Allergy status to penicillin: Secondary | ICD-10-CM

## 2014-10-12 DIAGNOSIS — K5900 Constipation, unspecified: Secondary | ICD-10-CM | POA: Diagnosis present

## 2014-10-12 DIAGNOSIS — Z91018 Allergy to other foods: Secondary | ICD-10-CM

## 2014-10-12 DIAGNOSIS — R10813 Right lower quadrant abdominal tenderness: Secondary | ICD-10-CM

## 2014-10-12 HISTORY — DX: Polycystic ovarian syndrome: E28.2

## 2014-10-12 LAB — CBC WITH DIFFERENTIAL/PLATELET
Basophils Absolute: 0 10*3/uL (ref 0.0–0.1)
Basophils Relative: 0 % (ref 0–1)
Eosinophils Absolute: 0 10*3/uL (ref 0.0–0.7)
Eosinophils Relative: 0 % (ref 0–5)
HEMATOCRIT: 41.2 % (ref 36.0–46.0)
Hemoglobin: 13.8 g/dL (ref 12.0–15.0)
LYMPHS ABS: 2 10*3/uL (ref 0.7–4.0)
Lymphocytes Relative: 12 % (ref 12–46)
MCH: 28.3 pg (ref 26.0–34.0)
MCHC: 33.5 g/dL (ref 30.0–36.0)
MCV: 84.4 fL (ref 78.0–100.0)
MONOS PCT: 9 % (ref 3–12)
Monocytes Absolute: 1.4 10*3/uL — ABNORMAL HIGH (ref 0.1–1.0)
NEUTROS ABS: 12.6 10*3/uL — AB (ref 1.7–7.7)
NEUTROS PCT: 79 % — AB (ref 43–77)
PLATELETS: 270 10*3/uL (ref 150–400)
RBC: 4.88 MIL/uL (ref 3.87–5.11)
RDW: 12.6 % (ref 11.5–15.5)
WBC: 16.1 10*3/uL — AB (ref 4.0–10.5)

## 2014-10-12 LAB — URINALYSIS, ROUTINE W REFLEX MICROSCOPIC
BILIRUBIN URINE: NEGATIVE
Glucose, UA: NEGATIVE mg/dL
Ketones, ur: NEGATIVE mg/dL
Leukocytes, UA: NEGATIVE
Nitrite: NEGATIVE
PH: 5.5 (ref 5.0–8.0)
Protein, ur: NEGATIVE mg/dL
SPECIFIC GRAVITY, URINE: 1.024 (ref 1.005–1.030)
Urobilinogen, UA: 1 mg/dL (ref 0.0–1.0)

## 2014-10-12 LAB — COMPREHENSIVE METABOLIC PANEL
ALBUMIN: 4.2 g/dL (ref 3.5–5.2)
ALT: 22 U/L (ref 0–35)
ANION GAP: 8 (ref 5–15)
AST: 18 U/L (ref 0–37)
Alkaline Phosphatase: 70 U/L (ref 39–117)
BUN: 9 mg/dL (ref 6–23)
CHLORIDE: 103 mmol/L (ref 96–112)
CO2: 26 mmol/L (ref 19–32)
CREATININE: 0.76 mg/dL (ref 0.50–1.10)
Calcium: 9.1 mg/dL (ref 8.4–10.5)
GFR calc non Af Amer: >90 mL/min (ref >90)
GLUCOSE: 106 mg/dL — AB (ref 70–99)
POTASSIUM: 3.5 mmol/L (ref 3.5–5.1)
SODIUM: 137 mmol/L (ref 135–145)
TOTAL PROTEIN: 8.4 g/dL — AB (ref 6.0–8.3)
Total Bilirubin: 1.2 mg/dL (ref 0.3–1.2)

## 2014-10-12 LAB — URINE MICROSCOPIC-ADD ON

## 2014-10-12 LAB — WET PREP, GENITAL
Clue Cells Wet Prep HPF POC: NONE SEEN
Trich, Wet Prep: NONE SEEN
Yeast Wet Prep HPF POC: NONE SEEN

## 2014-10-12 LAB — LIPASE, BLOOD: Lipase: 21 U/L (ref 11–59)

## 2014-10-12 LAB — POC URINE PREG, ED: PREG TEST UR: NEGATIVE

## 2014-10-12 MED ORDER — DEXTROSE 5 % IV SOLN
2.0000 g | Freq: Two times a day (BID) | INTRAVENOUS | Status: DC
  Administered 2014-10-12 – 2014-10-15 (×7): 2 g via INTRAVENOUS
  Filled 2014-10-12 (×8): qty 2

## 2014-10-12 MED ORDER — CLINDAMYCIN PHOSPHATE 900 MG/50ML IV SOLN: 900.0000 mg | Freq: Once | INTRAVENOUS | Status: DC

## 2014-10-12 MED ORDER — INFLUENZA VAC SPLIT QUAD 0.5 ML IM SUSY: 0.5000 mL | PREFILLED_SYRINGE | INTRAMUSCULAR | Status: DC

## 2014-10-12 MED ORDER — DOXYCYCLINE HYCLATE 100 MG PO TABS
100.0000 mg | ORAL_TABLET | Freq: Two times a day (BID) | ORAL | Status: DC
  Administered 2014-10-13 – 2014-10-15 (×6): 100 mg via ORAL
  Filled 2014-10-12 (×7): qty 1

## 2014-10-12 MED ORDER — ONDANSETRON HCL 4 MG PO TABS
4.0000 mg | ORAL_TABLET | Freq: Four times a day (QID) | ORAL | Status: DC | PRN
  Administered 2014-10-15: 4 mg via ORAL
  Filled 2014-10-12: qty 1

## 2014-10-12 MED ORDER — PNEUMOCOCCAL VAC POLYVALENT 25 MCG/0.5ML IJ INJ
0.5000 mL | INJECTION | INTRAMUSCULAR | Status: DC
  Filled 2014-10-12: qty 0.5

## 2014-10-12 MED ORDER — GENTAMICIN SULFATE 40 MG/ML IJ SOLN
530.0000 mg | INTRAMUSCULAR | Status: DC
  Administered 2014-10-12: 530 mg via INTRAVENOUS
  Filled 2014-10-12: qty 13.25

## 2014-10-12 MED ORDER — ONDANSETRON HCL 4 MG/2ML IJ SOLN
4.0000 mg | Freq: Four times a day (QID) | INTRAMUSCULAR | Status: DC | PRN
  Administered 2014-10-13: 4 mg via INTRAVENOUS
  Filled 2014-10-12: qty 2

## 2014-10-12 MED ORDER — MORPHINE SULFATE 4 MG/ML IJ SOLN
4.0000 mg | Freq: Once | INTRAMUSCULAR | Status: AC
  Administered 2014-10-12: 4 mg via INTRAVENOUS
  Filled 2014-10-12: qty 1

## 2014-10-12 MED ORDER — DEXTROSE 5 % IV SOLN
2.0000 g | Freq: Two times a day (BID) | INTRAVENOUS | Status: DC
  Filled 2014-10-12: qty 2

## 2014-10-12 MED ORDER — SODIUM CHLORIDE 0.9 % IJ SOLN
3.0000 mL | Freq: Two times a day (BID) | INTRAMUSCULAR | Status: DC
  Administered 2014-10-13 – 2014-10-19 (×10): 3 mL via INTRAVENOUS

## 2014-10-12 MED ORDER — IOHEXOL 300 MG/ML  SOLN
50.0000 mL | Freq: Once | INTRAMUSCULAR | Status: AC | PRN
  Administered 2014-10-12: 50 mL via ORAL

## 2014-10-12 MED ORDER — METFORMIN HCL 500 MG PO TABS
500.0000 mg | ORAL_TABLET | Freq: Two times a day (BID) | ORAL | Status: DC
Start: 2014-10-13 — End: 2014-10-19
  Administered 2014-10-13 – 2014-10-19 (×12): 500 mg via ORAL
  Filled 2014-10-12 (×15): qty 1

## 2014-10-12 MED ORDER — SODIUM CHLORIDE 0.9 % IJ SOLN
3.0000 mL | INTRAMUSCULAR | Status: DC | PRN
  Administered 2014-10-17: 3 mL via INTRAVENOUS
  Filled 2014-10-12: qty 3

## 2014-10-12 MED ORDER — SODIUM CHLORIDE 0.9 % IV SOLN: 250.0000 mL | INTRAVENOUS | Status: DC | PRN

## 2014-10-12 MED ORDER — ACETAMINOPHEN 325 MG PO TABS
650.0000 mg | ORAL_TABLET | Freq: Once | ORAL | Status: AC
  Administered 2014-10-12: 650 mg via ORAL
  Filled 2014-10-12: qty 2

## 2014-10-12 MED ORDER — PRENATAL MULTIVITAMIN CH
1.0000 | ORAL_TABLET | Freq: Every day | ORAL | Status: DC
  Administered 2014-10-13 – 2014-10-19 (×6): 1 via ORAL
  Filled 2014-10-12 (×6): qty 1

## 2014-10-12 MED ORDER — HYDROCODONE-ACETAMINOPHEN 5-325 MG PO TABS
1.0000 | ORAL_TABLET | ORAL | Status: DC | PRN
  Administered 2014-10-13 – 2014-10-14 (×8): 1 via ORAL
  Administered 2014-10-15: 2 via ORAL
  Filled 2014-10-12 (×2): qty 1
  Filled 2014-10-12: qty 2
  Filled 2014-10-12 (×6): qty 1

## 2014-10-12 MED ORDER — SODIUM CHLORIDE 0.9 % IV BOLUS (SEPSIS)
1000.0000 mL | Freq: Once | INTRAVENOUS | Status: AC
  Administered 2014-10-12: 1000 mL via INTRAVENOUS

## 2014-10-12 MED ORDER — IOHEXOL 300 MG/ML  SOLN
100.0000 mL | Freq: Once | INTRAMUSCULAR | Status: AC | PRN
  Administered 2014-10-12: 100 mL via INTRAVENOUS

## 2014-10-12 MED ORDER — DOXYCYCLINE HYCLATE 100 MG PO TABS
100.0000 mg | ORAL_TABLET | Freq: Once | ORAL | Status: AC
  Administered 2014-10-12: 100 mg via ORAL
  Filled 2014-10-12: qty 1

## 2014-10-12 MED ORDER — SENNOSIDES-DOCUSATE SODIUM 8.6-50 MG PO TABS: 1.0000 | ORAL_TABLET | Freq: Every evening | ORAL | Status: DC | PRN

## 2014-10-12 MED ORDER — IBUPROFEN 600 MG PO TABS
600.0000 mg | ORAL_TABLET | Freq: Four times a day (QID) | ORAL | Status: DC | PRN
  Administered 2014-10-13 – 2014-10-18 (×9): 600 mg via ORAL
  Filled 2014-10-12 (×9): qty 1

## 2014-10-12 NOTE — ED Provider Notes (Signed)
CSN: [id number]     Arrival date & time 10/12/14  1033 History   First MD Initiated Contact with Patient 10/12/14 1218     Chief Complaint  Patient presents with  . Abdominal Pain  . Nausea  . Diarrhea     (Consider location/radiation/quality/duration/timing/severity/associated sxs/prior Treatment) HPI  Brittany Lindsey is a 24 y.o. female with PMH of DM presenting with bilateral lower abdominal tenderness worse on the right that started this morning. Pain is sharp/crampy and intermittent. Patient has had associated nausea without vomiting. She has also had 3 episodes of nonbloody diarrhea since yesterday. She also describes pressure with urination and when sitting on the toilet. She denies any burning or pain and cloudy urine or foul smell. Patient has never had pain like this before. She is been taking OTC meds without much relief. Patient denies any prior abdominal surgeries. Denies any pelvic complaints. No increase in vaginal discharge. No spotting. Patient has been diagnosed with PCO S has not had regular periods.    Past Medical History  Diagnosis Date  . Diabetes mellitus without complication    No past surgical history on file. Family History  Problem Relation Age of Onset  . Diabetes Mother   . Seizures Mother   . Hyperlipidemia Mother    History  Substance Use Topics  . Smoking status: Never Smoker   . Smokeless tobacco: Never Used  . Alcohol Use: No   OB History    No data available     Review of Systems 10 Systems reviewed and are negative for acute change except as noted in the HPI.    Allergies  Mushroom extract complex and Penicillins  Home Medications   Prior to Admission medications   Medication Sig Start Date End Date Taking? Authorizing Provider  doxycycline (VIBRAMYCIN) 100 MG capsule Take 1 capsule (100 mg total) by mouth 2 (two) times daily. Patient not taking: Reported on 10/12/2014 09/26/14   Antony Madura, PA-C  metFORMIN (GLUCOPHAGE) 500 MG  tablet Take 1 tablet (500 mg total) by mouth 2 (two) times daily with a meal. Patient not taking: Reported on 10/12/2014 02/13/14   Sheliah Hatch, MD  mupirocin cream (BACTROBAN) 2 % Apply 1 application topically 2 (two) times daily. Patient not taking: Reported on 09/25/2014 02/10/14   Sheliah Hatch, MD  sodium chloride (OCEAN) 0.65 % SOLN nasal spray Place 1 spray into both nostrils as needed for congestion. Patient not taking: Reported on 10/12/2014 09/26/14   Antony Madura, PA-C   BP 120/59 mmHg  Pulse 93  Temp(Src) 100.6 F (38.1 C) (Oral)  Resp 20  SpO2 100%  LMP  (LMP Unknown) Physical Exam  Constitutional: She appears well-developed and well-nourished. No distress.  HENT:  Head: Normocephalic and atraumatic.  Mouth/Throat: Oropharynx is clear and moist.  Eyes: Conjunctivae and EOM are normal. Right eye exhibits no discharge. Left eye exhibits no discharge.  Cardiovascular: Normal rate, regular rhythm and normal heart sounds.   Pulmonary/Chest: Effort normal and breath sounds normal. No respiratory distress. She has no wheezes.  Abdominal: Soft. Bowel sounds are normal. She exhibits no distension.  Patient with lower abdominal pain worse on the right and at McBurney's point. No rigidity, guarding or rebound. No CVA tenderness.  Genitourinary:  Cervix pink without lesions. Os closed. No CMT mild right adnexal tenderness. No left adnexal tenderness. Moderate white thin opaque discharge without foul odor. Nursing tech in room for exam.  Neurological: She is alert. She exhibits normal muscle tone. Coordination  normal.  Skin: Skin is warm and dry. She is not diaphoretic.  Nursing note and vitals reviewed.   ED Course  Procedures (including critical care time) Labs Review Labs Reviewed  CBC WITH DIFFERENTIAL/PLATELET - Abnormal; Notable for the following:    WBC 16.1 (*)    Neutrophils Relative % 79 (*)    Neutro Abs 12.6 (*)    Monocytes Absolute 1.4 (*)    All other  components within normal limits  COMPREHENSIVE METABOLIC PANEL - Abnormal; Notable for the following:    Glucose, Bld 106 (*)    Total Protein 8.4 (*)    All other components within normal limits  URINALYSIS, ROUTINE W REFLEX MICROSCOPIC - Abnormal; Notable for the following:    Color, Urine AMBER (*)    APPearance CLOUDY (*)    Hgb urine dipstick TRACE (*)    All other components within normal limits  WET PREP, GENITAL  LIPASE, BLOOD  URINE MICROSCOPIC-ADD ON  RPR  HIV ANTIBODY (ROUTINE TESTING)  POC URINE PREG, ED  GC/CHLAMYDIA PROBE AMP ([place])    Imaging Review No results found.   EKG Interpretation None      MDM   Final diagnoses:  Right lower quadrant abdominal pain   Pt with right lower quadrant pain, fever and decreased appetite. Pt abdominal exam elicits significantly more pain than her pelvic. No CMT with right adnexal tenderness. Leukocytosis 16. Pt given IV fluids, Morphine. Plan for CT to r/o appendicitis. CT without evidence of appendicitis with evidence of possible Hydrosalpinx or pyosalpinx. Patient without significant tenderness on recheck exam but she does have moderate blood cells on wet prep as well as fever. Patient also with history of polycystic ovarian syndrome. Patient states she routinely is tested for STDs. She states her last test was September 2015 and negative. Unlikely to be due to routine sexual transmitted disease. Patient hemodynamically stable. Patient with significant improvement on Exam. Pt hungry. Will further evaluate the area with pelvic ultrasound.   [facility] inform a result of ultrasound due to difficulty with results crossing over. Patient stated there is a large right tubular mass to right fallopian tube that is filled with water and debris that could indicate infection. Ovaries don't appear to be involved. No discrete abscess. Due to patient's history of fever, leukocytosis as well as her moderate white cells  on pelvic these results are consistent with pyosalpinx. Patient started on IV clindamycin as well as gentamicin per pharmacy recommendation. Patient is currently hemodynamically stable. Consult to hospitalist for admission.   Pt signed out to HoneywellLeslie Sofia PA-C at shift change.  Discussed all results and patient verbalizes understanding and agrees with plan.  Case has been discussed with Dr. Deretha EmoryZackowski who agrees with the above plan and to discharge.    Louann SjogrenVictoria L Jereme Loren, PA-C 10/13/14 2110  Vanetta MuldersScott Zackowski, MD 10/16/14 [phone]

## 2014-10-12 NOTE — ED Notes (Signed)
Pt c/o bilateral low abd pain (worse on rt) with diarrhea and nausea.  Also c/o dysuria.  States that her symptoms started 2 days ago.

## 2014-10-12 NOTE — H&P (Signed)
[facility]ulty [facility] GYN History and Physical  Brittany Lindsey ZOX:[id number] DOB: 26-Feb-1991 DOA: 10/12/2014   Referring Provider: Cheron Schaumann, PA-C, ED provider Chief Complaint: Right lower abdominal pain  HPI: Brittany Lindsey is a 24 y.o. female with a history of PCO S and prediabetes presented to [facility] emergency department with right lower quadrant pain that started this morning. Pain is sharp and crampy and is approximate 7 out of 10. Pain is worse with movement and lying flat. The pain is better with rest and sitting upright. She does have associated nausea without vomiting. She admits to subjective fever. CT scan and ultrasound done at [facility] emergency department shows a right tubo-ovarian abscess.  She denies history of pelvic inflammatory disease or STDs.   Review of Systems:   Pt complains of loose stools, nausea, pelvic pressure with urination.  Pt denies any chest pain, shortness of breath, vomiting, constipation, bloody stools, vaginal discharge, headache, palpitations him a rash.  Review of systems are otherwise negative  Past Medical History: Past Medical History  Diagnosis Date  . Diabetes mellitus without complication     diet controled  . PCOS (polycystic ovarian syndrome)     Past Surgical History: Past Surgical History  Procedure Laterality Date  . No past surgeries      Obstetrical History: OB History    No data available      Gynecological History: OB History    No data available      Social History: History   Social History  . Marital Status: Single    Spouse Name: N/A    Number of Children: N/A  . Years of Education: N/A   Social History Main Topics  . Smoking status: Never Smoker   . Smokeless tobacco: Never Used  . Alcohol Use: No  . Drug Use: No  . Sexual Activity: Yes    Birth Control/ Protection: None   Other Topics Concern  . None   Social History Narrative    Family History: Family History  Problem Relation  Age of Onset  . Diabetes Mother   . Seizures Mother   . Hyperlipidemia Mother     Allergies: Allergies  Allergen Reactions  . Mushroom Extract Complex Hives  . Penicillins Hives    Prescriptions prior to admission  Medication Sig Dispense Refill Last Dose  . doxycycline (VIBRAMYCIN) 100 MG capsule Take 1 capsule (100 mg total) by mouth 2 (two) times daily. (Patient not taking: Reported on 10/12/2014) 20 capsule 0   . metFORMIN (GLUCOPHAGE) 500 MG tablet Take 1 tablet (500 mg total) by mouth 2 (two) times daily with a meal. (Patient not taking: Reported on 10/12/2014) 60 tablet 6   . mupirocin cream (BACTROBAN) 2 % Apply 1 application topically 2 (two) times daily. (Patient not taking: Reported on 09/25/2014) 30 g 3   . sodium chloride (OCEAN) 0.65 % SOLN nasal spray Place 1 spray into both nostrils as needed for congestion. (Patient not taking: Reported on 10/12/2014) 1 Bottle 0     Physical Exam: BP 123/67 mmHg  Pulse 108  Temp(Src) 99.2 F (37.3 C) (Oral)  Resp 18  Ht  (1.626 m)  Wt 240 lb (108.863 kg)  BMI 41.18 kg/m2  SpO2 100%  LMP 06/11/2014  BP 123/67 mmHg  Pulse 108  Temp(Src) 99.2 F (37.3 C) (Oral)  Resp 18  Ht  (1.626 m)  Wt 240 lb (108.863 kg)  BMI 41.18 kg/m2  SpO2 100%  LMP 06/11/2014 General appearance: alert,  cooperative and no distress Head: Normocephalic, without obvious abnormality, atraumatic Lungs: clear to auscultation bilaterally Heart: regular rate and rhythm, S1, S2 normal, no murmur, click, rub or gallop Abdomen: Soft, tender in the right lower quadrant without guarding or rebound tenderness. No hepatosplenomegaly Extremities: extremities normal, atraumatic, no cyanosis or edema, Homans sign is negative, no sign of DVT and no edema, redness or tenderness in the calves or thighs Pulses: 2+ and symmetric Skin: Skin color, texture, turgor normal. No rashes or lesions            Labs on Admission:  Basic Metabolic Panel:  Recent  Labs Lab 10/12/14 1105  NA 137  K 3.5  CL 103  CO2 26  GLUCOSE 106*  BUN 9  CREATININE 0.76  CALCIUM 9.1   Liver Function Tests:  Recent Labs Lab 10/12/14 1105  AST 18  ALT 22  ALKPHOS 70  BILITOT 1.2  PROT 8.4*  ALBUMIN 4.2    Recent Labs Lab 10/12/14 1105  LIPASE 21   No results for input(s): AMMONIA in the last 168 hours. CBC:  Recent Labs Lab 10/12/14 1105  WBC 16.1*  NEUTROABS 12.6*  HGB 13.8  HCT 41.2  MCV 84.4  PLT 270    CBG: No results for input(s): GLUCAP in the last 168 hours.  Radiological Exams on Admission: US Transvaginal Non-ob  10/12/2014   CLINICAL DATA:  Lower abdominal pain, diarrhea, nausea and dysuria for 2 days, elevated white blood cell count, abnormal finding on CT scan suggesting hydrosalpinx or pyosalpinx.  EXAM: TRANSABDOMINAL AND TRANSVAGINAL ULTRASOUND OF PELVIS  TECHNIQUE: Both transabdominal and transvaginal ultrasound examinations of the pelvis were performed. Transabdominal technique was performed for global imaging of the pelvis including uterus, ovaries, adnexal regions, and pelvic cul-de-sac. It was necessary to proceed with endovaginal exam following the transabdominal exam to visualize the endometrium and adnexal regions.  COMPARISON:  CT scan of the abdomen and pelvis of today's date  FINDINGS: Uterus  Measurements: 7.6 x 3.2 x 4.1 cm. No fibroids or other mass visualized.  Endometrium  Thickness: 5 mm.  No focal abnormality visualized.  Right ovary  Measurements: 3.7 x 2.2 x 2.3 cm. There is a complex structure in the right adnexal region which does has have tubular configuration measuring 6.7 x 4.1 x 7.9 cm.  Left ovary  Measurements: 3.7 x 3.0 x 2.7 cm. Normal appearance/no adnexal mass.  Other findings  No free fluid.  IMPRESSION: 1. There is a complex tubular appearing right adnexal process consistent with clinically suspected hydrosalpinx or pyosalpinx. The right ovary itself is normal. The left ovary is normal. 2. The  uterus is normal in echotexture in the endometrial stripe is normal. 3. There is no free pelvic fluid.   Electronically Signed   By: David  [place]   On: 10/12/2014 18:27   US Pelvis Complete  10/12/2014   CLINICAL DATA:  Lower abdominal pain, diarrhea, nausea and dysuria for 2 days, elevated white blood cell count, abnormal finding on CT scan suggesting hydrosalpinx or pyosalpinx.  EXAM: TRANSABDOMINAL AND TRANSVAGINAL ULTRASOUND OF PELVIS  TECHNIQUE: Both transabdominal and transvaginal ultrasound examinations of the pelvis were performed. Transabdominal technique was performed for global imaging of the pelvis including uterus, ovaries, adnexal regions, and pelvic cul-de-sac. It was necessary to proceed with endovaginal exam following the transabdominal exam to visualize the endometrium and adnexal regions.  COMPARISON:  CT scan of the abdomen and pelvis of today's date  FINDINGS: Uterus  Measurements: 7.6 x 3.2  x 4.1 cm. No fibroids or other mass visualized.  Endometrium  Thickness: 5 mm.  No focal abnormality visualized.  Right ovary  Measurements: 3.7 x 2.2 x 2.3 cm. There is a complex structure in the right adnexal region which does has have tubular configuration measuring 6.7 x 4.1 x 7.9 cm.  Left ovary  Measurements: 3.7 x 3.0 x 2.7 cm. Normal appearance/no adnexal mass.  Other findings  No free fluid.  IMPRESSION: 1. There is a complex tubular appearing right adnexal process consistent with clinically suspected hydrosalpinx or pyosalpinx. The right ovary itself is normal. The left ovary is normal. 2. The uterus is normal in echotexture in the endometrial stripe is normal. 3. There is no free pelvic fluid.   Electronically Signed   By: David  [place]azil[place]   On: 10/12/2014 18:27   Ct Abdomen Pelvis W Contrast  10/12/2014   CLINICAL DATA:  Bilateral lower abdominal pain, diarrhea, nausea and dysuria for 2 days. Elevated white blood cell count.  EXAM: CT ABDOMEN AND PELVIS WITH CONTRAST  TECHNIQUE: Multidetector  CT imaging of the abdomen and pelvis was performed using the standard protocol following bolus administration of intravenous contrast.  CONTRAST:  50 mL OMNIPAQUE IOHEXOL 300 MG/ML SOLN, 100 mL OMNIPAQUE IOHEXOL 300 MG/ML SOLN  COMPARISON:  None.  FINDINGS: The lung bases are clear.  No pleural or pericardial effusion.  The liver is somewhat low attenuating consistent with fatty infiltration. Foci of high attenuation in the gallbladder are compatible with stones or sludge. No evidence of cholecystitis is present. The spleen, adrenal glands, pancreas and kidneys appear normal.  A dilated tubular structure measuring up to 2.6 cm in diameter is seen in the right adnexa. It is difficult in differentiate this structure from the patient's ovary. The uterus and left ovary are unremarkable. The stomach, small and large bowel and appendix appear normal. No lymphadenopathy is identified. No focal bony abnormality is identified.  IMPRESSION: Tubular structure the right adnexa is consistent with hydrosalpinx or pyosalpinx. Recommend clinical correlation for cervical motion tenderness. Pelvic ultrasound could also be used for further evaluation.  Negative for appendicitis.  Gallstones or gallbladder sludge without CT evidence of cholecystitis.  Fatty infiltration of the liver.   Electronically Signed   By: Drusilla Kannerhomas  Dalessio M.D.   On: 10/12/2014 14:39     Assessment/Plan Present on Admission:  . TOA (tubo-ovarian abscess)  #1 tubo-ovarian abscess #2 PCO S  Admit to women's unit. Patient received gentamicin in the ER. Will change the patient to cefotetan and doxycycline. While the patient is allergic to penicillin, there is a low rate of cross-reactivity with cefotetan and penicillin. Ibuprofen and Percocet for pain Zofran for nausea  Code Status: Full  Family Communication: None   Disposition Plan: Home following treatment  Time spent: 50 minutes  Candelaria CelesteJacob Ralpheal Zappone, DO Faculty Practice Attending  [facility]ician [facility] Attending Phone #: [phone]  **Disclaimer: This note may have been dictated with voice recognition software. Similar sounding words can inadvertently be transcribed and this note may contain transcription errors which may not have been corrected upon publication of note.**

## 2014-10-12 NOTE — ED Notes (Signed)
Report given to [facility]. 

## 2014-10-12 NOTE — ED Notes (Signed)
[facility] called for transport to [facility] 

## 2014-10-12 NOTE — ED Notes (Signed)
Patient transported to Ultrasound 

## 2014-10-12 NOTE — ED Provider Notes (Signed)
I spoke with Dr. Adrian BlackwaterStinson  [facility]   He will admit for treatment of TOA.  On exam pt is feeling better, abdomen is soft with minimally right lower quadrant pain.   Pt is drinking and eating normally.  Lonia SkinnerLeslie K RobbinsSofia, PA-C 10/12/14 1857  Tilden FossaElizabeth Rees, MD 10/12/14 2005

## 2014-10-12 NOTE — Progress Notes (Signed)
ANTIBIOTIC CONSULT NOTE - INITIAL  Pharmacy Consult for Gentamyicin Indication: pyosalpinx  Allergies  Allergen Reactions  . Mushroom Extract Complex Hives  . Penicillins Hives    Patient Measurements:   Adjusted Body Weight: 76.6kg  Vital Signs: Temp: 98.7 F (37.1 C) (02/04 1536) Temp Source: Oral (02/04 1536) BP: 102/73 mmHg (02/04 1536) Pulse Rate: 95 (02/04 1536) Intake/Output from previous day:   Intake/Output from this shift: Total I/O In: 1000 [IV Piggyback:1000] Out: -   Labs:  Recent Labs  10/12/14 1105  WBC 16.1*  HGB 13.8  PLT 270  CREATININE 0.76   CrCl cannot be calculated (Unknown ideal weight.). No results for input(s): VANCOTROUGH, VANCOPEAK, VANCORANDOM, GENTTROUGH, GENTPEAK, GENTRANDOM, TOBRATROUGH, TOBRAPEAK, TOBRARND, AMIKACINPEAK, AMIKACINTROU, AMIKACIN in the last 72 hours.   Microbiology: Recent Results (from the past 720 hour(s))  Wet prep, genital     Status: Abnormal   Collection Time: 10/12/14  1:08 PM  Result Value Ref Range Status   Yeast Wet Prep HPF POC NONE SEEN NONE SEEN Final   Trich, Wet Prep NONE SEEN NONE SEEN Final   Clue Cells Wet Prep HPF POC NONE SEEN NONE SEEN Final   WBC, Wet Prep HPF POC MODERATE (A) NONE SEEN Final    Medical History: Past Medical History  Diagnosis Date  . Diabetes mellitus without complication     diet controled    Assessment: 24 y.o. female with PMH of DM presenting with bilateral lower abdominal tenderness worse on the right that started this morning. Pain is sharp/crampy and intermittent. Patient has had associated nausea without vomiting. She has also had 3 episodes of nonbloody diarrhea since yesterday. She also describes pressure with urination and when sitting on the toilet. She denies any burning or pain and cloudy urine or foul smell. Patient has never had pain like this before. She is been taking OTC meds without much relief. Patient denies any prior abdominal surgeries. Denies any  pelvic complaints. No increase in vaginal discharge. No spotting. Patient has been diagnosed with PCO S has not had regular periods.  Pharmacy consulted to dose Gentamicin.  Goal of Therapy:  Gentamicin per renal function  Plan:   Gentamicin  530mg  (7mg /kg/ABW) IV q24h   10 hour random gent level   Follow renal function/clinical course/ cultures  Arley PhenixEllen Kerilyn Cortner RPh 10/12/2014, 5:24 PM Pager [phone]

## 2014-10-13 LAB — CBC WITH DIFFERENTIAL/PLATELET
BASOS ABS: 0 10*3/uL (ref 0.0–0.1)
BASOS PCT: 0 % (ref 0–1)
Eosinophils Absolute: 0 10*3/uL (ref 0.0–0.7)
Eosinophils Relative: 0 % (ref 0–5)
HCT: 38.9 % (ref 36.0–46.0)
Hemoglobin: 13.1 g/dL (ref 12.0–15.0)
Lymphocytes Relative: 10 % — ABNORMAL LOW (ref 12–46)
Lymphs Abs: 1.8 10*3/uL (ref 0.7–4.0)
MCH: 28.4 pg (ref 26.0–34.0)
MCHC: 33.7 g/dL (ref 30.0–36.0)
MCV: 84.4 fL (ref 78.0–100.0)
MONOS PCT: 7 % (ref 3–12)
Monocytes Absolute: 1.2 10*3/uL — ABNORMAL HIGH (ref 0.1–1.0)
NEUTROS ABS: 14.3 10*3/uL — AB (ref 1.7–7.7)
Neutrophils Relative %: 83 % — ABNORMAL HIGH (ref 43–77)
Platelets: 219 10*3/uL (ref 150–400)
RBC: 4.61 MIL/uL (ref 3.87–5.11)
RDW: 12.8 % (ref 11.5–15.5)
WBC: 17.4 10*3/uL — ABNORMAL HIGH (ref 4.0–10.5)

## 2014-10-13 LAB — GC/CHLAMYDIA PROBE AMP (~~LOC~~) NOT AT ARMC
Chlamydia: NEGATIVE
Chlamydia: NEGATIVE
Neisseria Gonorrhea: NEGATIVE
Neisseria Gonorrhea: NEGATIVE

## 2014-10-13 LAB — RPR: RPR Ser Ql: NONREACTIVE

## 2014-10-13 LAB — HIV ANTIBODY (ROUTINE TESTING W REFLEX): HIV SCREEN 4TH GENERATION: NONREACTIVE

## 2014-10-13 MED ORDER — ACETAMINOPHEN 325 MG PO TABS
650.0000 mg | ORAL_TABLET | ORAL | Status: DC | PRN
  Administered 2014-10-13 – 2014-10-17 (×4): 650 mg via ORAL
  Filled 2014-10-13 (×4): qty 2

## 2014-10-13 NOTE — Progress Notes (Signed)
Subjective: Patient reports of increased RLQ pain this AM.  Was comfortable until she woke up.  Took some medication, which is helping.  Had fever last night at 1am of 101.4.  Improved with tylenol.  Tolerating PO.  Objective: I have reviewed patient's vital signs, intake and output, medications and labs.  General: alert, cooperative and no distress Resp: clear to auscultation bilaterally Cardio: regular rate and rhythm, S1, S2 normal, no murmur, click, rub or gallop GI: RLQ tenderness.  Soft. Nondistended.  Extremities: extremities normal, atraumatic, no cyanosis or edema  Lab Results  Component Value Date   WBC 17.4* 10/13/2014   HGB 13.1 10/13/2014   HCT 38.9 10/13/2014   MCV 84.4 10/13/2014   PLT 219 10/13/2014     Assessment/Plan: 1.  Right TOA  Continue cefotetan and doxycycline. Continue tylenol, pain medication    LOS: 1 day    Braxley Balandran JEHIEL 10/13/2014, 7:26 AM

## 2014-10-14 NOTE — Progress Notes (Signed)
Subjective: Patient reports nausea and vomiting.  This is improved, appetite still decreased  Objective: I have reviewed patient's vital signs, medications and labs.  General: alert, cooperative and no distress GI: mild lower abdominal tendernesw Extremities: extremities normal, atraumatic, no cyanosis or edema   CLINICAL DATA: Lower abdominal pain, diarrhea, nausea and dysuria for 2 days, elevated white blood cell count, abnormal finding on CT scan suggesting hydrosalpinx or pyosalpinx.  EXAM: TRANSABDOMINAL AND TRANSVAGINAL ULTRASOUND OF PELVIS  TECHNIQUE: Both transabdominal and transvaginal ultrasound examinations of the pelvis were performed. Transabdominal technique was performed for global imaging of the pelvis including uterus, ovaries, adnexal regions, and pelvic cul-de-sac. It was necessary to proceed with endovaginal exam following the transabdominal exam to visualize the endometrium and adnexal regions.  COMPARISON: CT scan of the abdomen and pelvis of today's date  FINDINGS: Uterus  Measurements: 7.6 x 3.2 x 4.1 cm. No fibroids or other mass visualized.  Endometrium  Thickness: 5 mm. No focal abnormality visualized.  Right ovary  Measurements: 3.7 x 2.2 x 2.3 cm. There is a complex structure in the right adnexal region which does has have tubular configuration measuring 6.7 x 4.1 x 7.9 cm.  Left ovary  Measurements: 3.7 x 3.0 x 2.7 cm. Normal appearance/no adnexal mass.  Other findings  No free fluid.  IMPRESSION: 1. There is a complex tubular appearing right adnexal process consistent with clinically suspected hydrosalpinx or pyosalpinx. The right ovary itself is normal. The left ovary is normal. 2. The uterus is normal in echotexture in the endometrial stripe is normal. 3. There is no free pelvic fluid.   Electronically Signed  By: David SwazilandJordan  On: 10/12/2014 18:27    Assessment/Plan: PID HD 2 continue antibiotic    LOS: 2 days    Brittany Lindsey 10/14/2014, 7:45 AM

## 2014-10-14 NOTE — Progress Notes (Signed)
Dr. Macon LargeAnyanwu notified of patients temp. Of 101.2.  Orders received.  Will continue to monitor.

## 2014-10-15 DIAGNOSIS — N7093 Salpingitis and oophoritis, unspecified: Principal | ICD-10-CM

## 2014-10-15 NOTE — Progress Notes (Signed)
Subjective: Patient reports some abdominal pain, improved from admission. Had fever of 101.2 around 1830 yesterday  Objective: I have reviewed patient's vital signs, medications and labs. Temp:  [99.4 F (37.4 C)-101.2 F (38.4 C)] 99.6 F (37.6 C) (02/07 0616) Pulse Rate:  [103-107] 107 (02/07 0616) Resp:  [18-20] 18 (02/07 0616) BP: (108-121)/(54-76) 121/76 mmHg (02/07 0616) SpO2:  [98 %-99 %] 99 % (02/07 0616)  General: alert, cooperative and no distress GI: mild lower abdominal tenderness Extremities: extremities normal, atraumatic, no cyanosis or edema   CLINICAL DATA: Lower abdominal pain, diarrhea, nausea and dysuria for 2 days, elevated white blood cell count, abnormal finding on CT scan suggesting hydrosalpinx or pyosalpinx.  EXAM: TRANSABDOMINAL AND TRANSVAGINAL ULTRASOUND OF PELVIS  TECHNIQUE: Both transabdominal and transvaginal ultrasound examinations of the pelvis were performed. Transabdominal technique was performed for global imaging of the pelvis including uterus, ovaries, adnexal regions, and pelvic cul-de-sac. It was necessary to proceed with endovaginal exam following the transabdominal exam to visualize the endometrium and adnexal regions.  COMPARISON: CT scan of the abdomen and pelvis of today's date  FINDINGS: Uterus  Measurements: 7.6 x 3.2 x 4.1 cm. No fibroids or other mass visualized.  Endometrium  Thickness: 5 mm. No focal abnormality visualized.  Right ovary  Measurements: 3.7 x 2.2 x 2.3 cm. There is a complex structure in the right adnexal region which does has have tubular configuration measuring 6.7 x 4.1 x 7.9 cm.  Left ovary  Measurements: 3.7 x 3.0 x 2.7 cm. Normal appearance/no adnexal mass.  Other findings  No free fluid.  IMPRESSION: 1. There is a complex tubular appearing right adnexal process consistent with clinically suspected hydrosalpinx or pyosalpinx. The right ovary itself is normal. The  left ovary is normal. 2. The uterus is normal in echotexture in the endometrial stripe is normal. 3. There is no free pelvic fluid.   Electronically Signed  By: David SwazilandJordan  On: 10/12/2014 18:27    Assessment/Plan: PID HD 3  Continue Cefotetan and Doxycycline Follow up cultures that were sent after fever on 10/14/14 Routine floor care    LOS: 3 days   Cornesha Radziewicz A, MD 10/15/2014, 7:43 AM

## 2014-10-15 NOTE — Progress Notes (Signed)
Dr. Shawnie PonsPratt notified of patients temperature of 102.3.  No new orders received.  Will continue to monitor.

## 2014-10-16 LAB — URINE CULTURE
CULTURE: NO GROWTH
Colony Count: NO GROWTH
Special Requests: NORMAL

## 2014-10-16 MED ORDER — SODIUM CHLORIDE 0.9 % IV SOLN
500.0000 mg | Freq: Three times a day (TID) | INTRAVENOUS | Status: DC
  Administered 2014-10-16 – 2014-10-19 (×10): 500 mg via INTRAVENOUS
  Filled 2014-10-16 (×12): qty 500

## 2014-10-16 NOTE — Progress Notes (Signed)
Subjective: Patient reports improving pain, but gas pain and feeling constipated since beding hospitalized.  Continue to spike fevers despite adequate Abx 102.3 at 1800 yesterday.  Objective: I have reviewed patient's vital signs, intake and output, medications, labs and radiology results.  General: alert, cooperative and appears stated age Resp: normal effort GI: soft, minimal tenderness LLQ, no rebound, no guarding. Extremities: extremities normal, atraumatic, no cyanosis or edema   Assessment/Plan: Active Problems:   TOA (tubo-ovarian abscess)  Continued fevers despite abx-->will change to Primaxin.   LOS: 4 days    Lindsey,Brittany S 10/16/2014, 7:48 AM

## 2014-10-17 ENCOUNTER — Inpatient Hospital Stay (HOSPITAL_COMMUNITY): Payer: Self-pay

## 2014-10-17 LAB — CBC
HCT: 37 % (ref 36.0–46.0)
Hemoglobin: 12.4 g/dL (ref 12.0–15.0)
MCH: 27.8 pg (ref 26.0–34.0)
MCHC: 33.5 g/dL (ref 30.0–36.0)
MCV: 83 fL (ref 78.0–100.0)
PLATELETS: 370 10*3/uL (ref 150–400)
RBC: 4.46 MIL/uL (ref 3.87–5.11)
RDW: 12.7 % (ref 11.5–15.5)
WBC: 15 10*3/uL — ABNORMAL HIGH (ref 4.0–10.5)

## 2014-10-17 NOTE — Progress Notes (Addendum)
Subjective: Patient reports sweats overnight and pain at IV site.  Pt frustrated to still be in the hosptial.  Objective: I have reviewed patient's vital signs, medications, labs and radiology results.  General: alert, cooperative and no distress GI: soft, tender in RLQ, no rebound, no guarding Extremities: Homans sign is negative, no sign of DVT Vaginal Bleeding: none   Assessment/Plan: T max 100.9 this am  Temperature curve decreasing.  Will repeat US to see if mass is growing.  May consider IR drainage.  NPO until decision. CBC Latest Ref Rng 10/13/2014 10/12/2014 02/10/2014  WBC 4.0 - 10.5 K/uL 17.4(H) 16.1(H) 8.8  Hemoglobin 12.0 - 15.0 g/dL 16.113.1 09.613.8 04.513.1  Hematocrit 36.0 - 46.0 % 38.9 41.2 37.3  Platelets 150 - 400 K/uL 219 270 279   Rpt CBC this am    LOS: 5 days    LEGGETT,KELLY H. 10/17/2014, 7:14 AM

## 2014-10-17 NOTE — Progress Notes (Signed)
Patient ID: Brittany Lindsey, female   DOB: May 17, 1991, 24 y.o.   MRN: [id number]  Discussed pt with Dr Fredia SorrowYamagata, who thought that the patient was not a good candidate for IR drainage at this time.  Will wait a couple of days to see if improves on imipenem.  If not improving, then will repeat CT with just IV contrast.  May be able to needle the abscess to obtain sample.  Relayed this to the patient.  [name], DO 10/17/2014 1:53 PM

## 2014-10-18 LAB — CBC WITH DIFFERENTIAL/PLATELET
BASOS ABS: 0.1 10*3/uL (ref 0.0–0.1)
BASOS PCT: 1 % (ref 0–1)
Eosinophils Absolute: 0.2 10*3/uL (ref 0.0–0.7)
Eosinophils Relative: 2 % (ref 0–5)
HEMATOCRIT: 36.2 % (ref 36.0–46.0)
HEMOGLOBIN: 12.1 g/dL (ref 12.0–15.0)
LYMPHS PCT: 19 % (ref 12–46)
Lymphs Abs: 1.8 10*3/uL (ref 0.7–4.0)
MCH: 27.9 pg (ref 26.0–34.0)
MCHC: 33.4 g/dL (ref 30.0–36.0)
MCV: 83.4 fL (ref 78.0–100.0)
MONO ABS: 0.8 10*3/uL (ref 0.1–1.0)
MONOS PCT: 8 % (ref 3–12)
Neutro Abs: 6.6 10*3/uL (ref 1.7–7.7)
Neutrophils Relative %: 70 % (ref 43–77)
Platelets: 332 10*3/uL (ref 150–400)
RBC: 4.34 MIL/uL (ref 3.87–5.11)
RDW: 12.7 % (ref 11.5–15.5)
WBC: 9.4 10*3/uL (ref 4.0–10.5)

## 2014-10-18 MED ORDER — INFLUENZA VAC SPLIT QUAD 0.5 ML IM SUSY: 0.5000 mL | PREFILLED_SYRINGE | INTRAMUSCULAR | Status: DC

## 2014-10-18 NOTE — Progress Notes (Signed)
Subjective: Patient reports mild central pelvic pain.  Good appetite.  No fevers last night.    Objective: I have reviewed patient's vital signs, intake and output, medications, labs and microbiology.  General: alert, cooperative and no distress Resp: clear to auscultation bilaterally Cardio: regular rate and rhythm, S1, S2 normal, no murmur, click, rub or gallop GI: soft, non-tender; bowel sounds normal; no masses,  no organomegaly Extremities: extremities normal, atraumatic, no cyanosis or edema, Homans sign is negative, no sign of DVT and no edema, redness or tenderness in the calves or thighs  CBC Latest Ref Rng 10/18/2014 10/17/2014 10/13/2014  WBC 4.0 - 10.5 K/uL 9.4 15.0(H) 17.4(H)  Hemoglobin 12.0 - 15.0 g/dL 16.112.1 09.612.4 04.513.1  Hematocrit 36.0 - 46.0 % 36.2 37.0 38.9  Platelets 150 - 400 K/uL 332 370 219    Assessment/Plan: 1.  TOA  Improvement of WBC.  Improved fevers and WBC after changing to imipenem.  Afebrile for 24 hours.  Last fever 2/9 0530.      LOS: 6 days    STINSON, JACOB JEHIEL 10/18/2014, 7:47 AM

## 2014-10-19 LAB — CBC WITH DIFFERENTIAL/PLATELET
BASOS ABS: 0.1 10*3/uL (ref 0.0–0.1)
BASOS PCT: 1 % (ref 0–1)
Eosinophils Absolute: 0.2 10*3/uL (ref 0.0–0.7)
Eosinophils Relative: 2 % (ref 0–5)
HEMATOCRIT: 36.4 % (ref 36.0–46.0)
HEMOGLOBIN: 12.3 g/dL (ref 12.0–15.0)
LYMPHS PCT: 23 % (ref 12–46)
Lymphs Abs: 2 10*3/uL (ref 0.7–4.0)
MCH: 28.1 pg (ref 26.0–34.0)
MCHC: 33.8 g/dL (ref 30.0–36.0)
MCV: 83.3 fL (ref 78.0–100.0)
MONOS PCT: 9 % (ref 3–12)
Monocytes Absolute: 0.8 10*3/uL (ref 0.1–1.0)
NEUTROS ABS: 5.8 10*3/uL (ref 1.7–7.7)
Neutrophils Relative %: 65 % (ref 43–77)
Platelets: 363 10*3/uL (ref 150–400)
RBC: 4.37 MIL/uL (ref 3.87–5.11)
RDW: 12.7 % (ref 11.5–15.5)
WBC: 8.9 10*3/uL (ref 4.0–10.5)

## 2014-10-19 MED ORDER — DOXYCYCLINE HYCLATE 100 MG PO CAPS: 100.0000 mg | ORAL_CAPSULE | Freq: Two times a day (BID) | ORAL | Status: DC

## 2014-10-19 MED ORDER — IBUPROFEN 600 MG PO TABS: 600.0000 mg | ORAL_TABLET | Freq: Four times a day (QID) | ORAL | Status: DC | PRN

## 2014-10-19 MED ORDER — METRONIDAZOLE 500 MG PO TABS: 500.0000 mg | ORAL_TABLET | Freq: Two times a day (BID) | ORAL | Status: AC

## 2014-10-19 NOTE — Progress Notes (Signed)
Patient ID: Jobe Markermanda Aikens, female   DOB: Jun 17, 1991, [age] y.o.   MRN: [id number] Subjective: Patient reports no pain at all  Objective: I have reviewed patient's vital signs, intake and output, medications, labs and microbiology.  General: alert, cooperative and no distress Resp: clear to auscultation bilaterally Cardio: regular rate and rhythm, S1, S2 normal, no murmur, click, rub or gallop GI: soft, non-tender; bowel sounds normal; no masses,  no organomegaly Extremities: extremities normal, atraumatic, no cyanosis or edema, Homans sign is negative, no sign of DVT and no edema, redness or tenderness in the calves or thighs   Filed Vitals:   10/18/14 1719 10/18/14 2108 10/19/14 0109 10/19/14 0623  BP: 119/66 122/78 115/60 112/85  Pulse: 86 86 79 73  Temp: 98.9 F (37.2 C) 98.2 F (36.8 C) 98.4 F (36.9 C) 98.6 F (37 C)  TempSrc: Oral Oral Oral Oral  Resp: 18 18 18 18   Height:      Weight:      SpO2: 100% 100% 99% 100%     CBC Latest Ref Rng 10/19/2014 10/18/2014 10/17/2014  WBC 4.0 - 10.5 K/uL 8.9 9.4 15.0(H)  Hemoglobin 12.0 - 15.0 g/dL 78.212.3 95.612.1 21.312.4  Hematocrit 36.0 - 46.0 % 36.4 36.2 37.0  Platelets 150 - 400 K/uL 363 332 370    Assessment/Plan: 1.  TOA  Pt really wants to go home, will be close to 48 hours afebrile this evening, to be evaluated for discharge  recommend 14 day course of cipro/flagyl.      LOS: 7 days    EURE,LUTHER H 10/19/2014, 8:39 AM

## 2014-10-19 NOTE — Discharge Summary (Signed)
Physician Discharge Summary  Patient ID: Brittany Lindsey MRN: [id number] DOB/AGE: [birth year] 23 y.o.  Admit date: 10/12/2014 Discharge date: 10/19/2014  Admission and Discharge Diagnoses:  Principal Problem:   TOA (tubo-ovarian abscess)  Discharged Condition: stable  Hospital Course: Patient was admitted for treatment of TOA.  Initially started on Cefotetan and Doxycycline but she continued to have fevers, so this was changed to Imipenem.  Her pain was treated with oral analgesia as needed.  While on Imipenem she initially had fevers but was finally able to remain fever free for close to 48 hours. She also had significant reduction in pain.  On 10/19/14, she was deemed stable for discharge to home; she was discharged on 14 day course of Doxycycline and Flagyl with plans to follow up in the outpatient clinic.  Consults: None  Significant Diagnostic Studies:  [place] Transvaginal Non-ob  10/17/2014   CLINICAL DATA:  24 year old female with right tubo-ovarian abscess and persistent fever  EXAM: ULTRASOUND PELVIS TRANSVAGINAL  TECHNIQUE: Transvaginal ultrasound examination of the pelvis was performed including evaluation of the uterus, ovaries, adnexal regions, and pelvic cul-de-sac.  Both transabdominal and transvaginal ultrasound examinations of the pelvis were performed. Transabdominal technique was performed for global imaging of the pelvis including uterus, ovaries, adnexal regions, and pelvic cul-de-sac. It was necessary to proceed with endovaginal exam following the transabdominal exam to visualize the right adnexa.  COMPARISON:  Recent pelvic ultrasound 10/12/2014 ; CT scan abdomen/pelvis also 10/12/2014  FINDINGS: Uterus  Measurements: 7.7 x 3.8 x 5.7 cm. No fibroids or other mass visualized.  Endometrium  Thickness: 8 mm.  No focal abnormality visualized.  Right ovary  Measurements: 5.2 x 5.3 x 4.9 cm. The ovary itself is unremarkable.  Left ovary  Measurements: Approximately 3.7 x 3.0 x 2.7 cm. The  ovary itself is unremarkable.  Other findings: Complex tubular structure containing heterogeneous internal fluid is partially imaged from both the right, and left adnexum. Correlation with the recent prior imaging confirms the epicenter of the structure is the right fallopian tube. The complex tubular collection is likely positioned more in the midline today than on previous studies. Unfortunately, the entirety of the structure is not well seen secondary to the midline position and direct comparison with previous imaging is very limited. There is no significant free fluid.  IMPRESSION: Complex tubular structure containing heterogeneous internal fluid is partially imaged from both the right, and left adnexum. Correlation with the recent prior imaging confirms the epicenter of the structure is the right fallopian tube. The hydro/pyosalpinx is likely positioned more in the midline today than on previous studies. Unfortunately, the entirety of the structure is not well seen secondary to the midline position and direct comparison with previous imaging is very limited.  There is no significant free fluid.   Electronically Signed   By: Malachy MoanHeath  McCullough M.D.   On: 10/17/2014 08:04   [place] Transvaginal Non-ob  10/12/2014   CLINICAL DATA:  Lower abdominal pain, diarrhea, nausea and dysuria for 2 days, elevated white blood cell count, abnormal finding on CT scan suggesting hydrosalpinx or pyosalpinx.  EXAM: TRANSABDOMINAL AND TRANSVAGINAL ULTRASOUND OF PELVIS  TECHNIQUE: Both transabdominal and transvaginal ultrasound examinations of the pelvis were performed. Transabdominal technique was performed for global imaging of the pelvis including uterus, ovaries, adnexal regions, and pelvic cul-de-sac. It was necessary to proceed with endovaginal exam following the transabdominal exam to visualize the endometrium and adnexal regions.  COMPARISON:  CT scan of the abdomen and pelvis of today's date  FINDINGS:  Uterus  Measurements: 7.6  x 3.2 x 4.1 cm. No fibroids or other mass visualized.  Endometrium  Thickness: 5 mm.  No focal abnormality visualized.  Right ovary  Measurements: 3.7 x 2.2 x 2.3 cm. There is a complex structure in the right adnexal region which does has have tubular configuration measuring 6.7 x 4.1 x 7.9 cm.  Left ovary  Measurements: 3.7 x 3.0 x 2.7 cm. Normal appearance/no adnexal mass.  Other findings  No free fluid.  IMPRESSION: 1. There is a complex tubular appearing right adnexal process consistent with clinically suspected hydrosalpinx or pyosalpinx. The right ovary itself is normal. The left ovary is normal. 2. The uterus is normal in echotexture in the endometrial stripe is normal. 3. There is no free pelvic fluid.   Electronically Signed   By: David  [place]   On: 10/12/2014 18:27   US Pelvis Complete  10/12/2014   CLINICAL DATA:  Lower abdominal pain, diarrhea, nausea and dysuria for 2 days, elevated white blood cell count, abnormal finding on CT scan suggesting hydrosalpinx or pyosalpinx.  EXAM: TRANSABDOMINAL AND TRANSVAGINAL ULTRASOUND OF PELVIS  TECHNIQUE: Both transabdominal and transvaginal ultrasound examinations of the pelvis were performed. Transabdominal technique was performed for global imaging of the pelvis including uterus, ovaries, adnexal regions, and pelvic cul-de-sac. It was necessary to proceed with endovaginal exam following the transabdominal exam to visualize the endometrium and adnexal regions.  COMPARISON:  CT scan of the abdomen and pelvis of today's date  FINDINGS: Uterus  Measurements: 7.6 x 3.2 x 4.1 cm. No fibroids or other mass visualized.  Endometrium  Thickness: 5 mm.  No focal abnormality visualized.  Right ovary  Measurements: 3.7 x 2.2 x 2.3 cm. There is a complex structure in the right adnexal region which does has have tubular configuration measuring 6.7 x 4.1 x 7.9 cm.  Left ovary  Measurements: 3.7 x 3.0 x 2.7 cm. Normal appearance/no adnexal mass.  Other findings  No free fluid.   IMPRESSION: 1. There is a complex tubular appearing right adnexal process consistent with clinically suspected hydrosalpinx or pyosalpinx. The right ovary itself is normal. The left ovary is normal. 2. The uterus is normal in echotexture in the endometrial stripe is normal. 3. There is no free pelvic fluid.   Electronically Signed   By: David  [place]   On: 10/12/2014 18:27   Ct Abdomen Pelvis W Contrast  10/12/2014   CLINICAL DATA:  Bilateral lower abdominal pain, diarrhea, nausea and dysuria for 2 days. Elevated white blood cell count.  EXAM: CT ABDOMEN AND PELVIS WITH CONTRAST  TECHNIQUE: Multidetector CT imaging of the abdomen and pelvis was performed using the standard protocol following bolus administration of intravenous contrast.  CONTRAST:  50 mL OMNIPAQUE IOHEXOL 300 MG/ML SOLN, 100 mL OMNIPAQUE IOHEXOL 300 MG/ML SOLN  COMPARISON:  None.  FINDINGS: The lung bases are clear.  No pleural or pericardial effusion.  The liver is somewhat low attenuating consistent with fatty infiltration. Foci of high attenuation in the gallbladder are compatible with stones or sludge. No evidence of cholecystitis is present. The spleen, adrenal glands, pancreas and kidneys appear normal.  A dilated tubular structure measuring up to 2.6 cm in diameter is seen in the right adnexa. It is difficult in differentiate this structure from the patient's ovary. The uterus and left ovary are unremarkable. The stomach, small and large bowel and appendix appear normal. No lymphadenopathy is identified. No focal bony abnormality is identified.  IMPRESSION: Tubular structure the right  adnexa is consistent with hydrosalpinx or pyosalpinx. Recommend clinical correlation for cervical motion tenderness. Pelvic ultrasound could also be used for further evaluation.  Negative for appendicitis.  Gallstones or gallbladder sludge without CT evidence of cholecystitis.  Fatty infiltration of the liver.   Electronically Signed   By: Drusilla Kanner  M.D.   On: 10/12/2014 14:39    Disposition: 01-Home or Self Care     Medication List    TAKE these medications        doxycycline 100 MG capsule  Commonly known as:  VIBRAMYCIN  Take 1 capsule (100 mg total) by mouth 2 (two) times daily.     ibuprofen 600 MG tablet  Commonly known as:  ADVIL,MOTRIN  Take 1 tablet (600 mg total) by mouth every 6 (six) hours as needed (mild pain).     metFORMIN 500 MG tablet  Commonly known as:  GLUCOPHAGE  Take 1 tablet (500 mg total) by mouth 2 (two) times daily with a meal.     metroNIDAZOLE 500 MG tablet  Commonly known as:  FLAGYL  Take 1 tablet (500 mg total) by mouth 2 (two) times daily.     mupirocin cream 2 %  Commonly known as:  BACTROBAN  Apply 1 application topically 2 (two) times daily.     sodium chloride 0.65 % Soln nasal spray  Commonly known as:  OCEAN  Place 1 spray into both nostrils as needed for congestion.           Follow-up Information    Follow up with [facility]. Schedule an appointment as soon as possible for a visit in 3 weeks.   Why:  For followup. Call clinic/come to [facility] for any concerning issues.   Contact information:   [address] [place] [address] [phone]      SignedJaynie Collins A,MD 10/19/2014, 1:29 PM

## 2014-10-19 NOTE — Progress Notes (Signed)
Discharge teaching complete. Pt understood all instructions and did not have any questions. Pt ambulated out of the hospital and discharged home to family.  

## 2014-10-22 LAB — CULTURE, BLOOD (ROUTINE X 2)
CULTURE: NO GROWTH
Culture: NO GROWTH

## 2014-11-23 ENCOUNTER — Telehealth: Payer: Self-pay | Admitting: Family Medicine

## 2014-11-23 NOTE — Telephone Encounter (Signed)
Caller name: Nick Relation to pt: self Call back number: [phone] Pharmacy:  Reason for call:   Patient states that Dr. Beverely Lowabori had referred her to an endocrinologist but patient never went because she lost her insurance. She has new insurance, eff 12/08/14 and would like to be referred again.

## 2014-11-23 NOTE — Telephone Encounter (Signed)
Spoke with pt who advised she was trying to get an appt with Tabori not with an endocrinologist. Pt advised that she had been in the hospital on 10/12/14 for a week for an infection in her fallopian tubes. Pt was made an appt on 12/08/14 (due to her insurance becoming effective that day) declined an earlier appt. States she is still having pressure at place of infection. Pt was advised to follow up here or with ER if pain worsens.

## 2014-11-23 NOTE — Telephone Encounter (Signed)
Agree w/ above- pt to be scheduled for hospital f/u.

## 2014-12-08 ENCOUNTER — Other Ambulatory Visit (HOSPITAL_COMMUNITY)
Admission: RE | Admit: 2014-12-08 | Discharge: 2014-12-08 | Disposition: A | Discharge status: Home / Self Care | Payer: Self-pay | Source: Ambulatory Visit | Attending: Family Medicine | Admitting: Family Medicine

## 2014-12-08 ENCOUNTER — Ambulatory Visit (INDEPENDENT_AMBULATORY_CARE_PROVIDER_SITE_OTHER): Payer: 59 | Admitting: Family Medicine

## 2014-12-08 ENCOUNTER — Encounter: Payer: Self-pay | Admitting: Family Medicine

## 2014-12-08 VITALS — BP 122/82 | HR 84 | Temp 98.2°F | Resp 16 | Wt 258.5 lb

## 2014-12-08 DIAGNOSIS — E282 Polycystic ovarian syndrome: Secondary | ICD-10-CM | POA: Diagnosis not present

## 2014-12-08 DIAGNOSIS — N7093 Salpingitis and oophoritis, unspecified: Secondary | ICD-10-CM | POA: Diagnosis not present

## 2014-12-08 DIAGNOSIS — N898 Other specified noninflammatory disorders of vagina: Secondary | ICD-10-CM

## 2014-12-08 DIAGNOSIS — N760 Acute vaginitis: Secondary | ICD-10-CM | POA: Insufficient documentation

## 2014-12-08 NOTE — Assessment & Plan Note (Signed)
New to provider.  Pt was recently hospitalized for 7 days due to this.  Just recently began having some intermittent RLQ discomfort again.  Asymptomatic today.  Completed course of abx but did not attend GYN f/u as recommended.  Will refer back to GYN today.  Pt expressed understanding and is in agreement w/ plan.

## 2014-12-08 NOTE — Progress Notes (Signed)
   Subjective:    Patient ID: Brittany Lindsey, female    DOB: 1991-05-27, 24 y.o.   MRN: [id number]  HPI Tubo-ovarian abscess- pt was admitted 2/4-2/11 w/ abscess after presenting w/ abd pain/fever/inability to urinate.  Was d/c'd on 14 day course of Doxy/Flagyl.  Was to have f/u w/ GYN in 3 weeks but did not go.  Pt is now having regular bowel and bladder habits.  Pt reports this week she has again started having discomfort in RLQ again- 'nothing like it was'.  No nausea.  Vomited x1 'the other day'.  Got menses 3/6.  Hx of PCOS.  Pt complaining of vaginal d/c- thin, watery, cloudy.   Review of Systems For ROS see HPI     Objective:   Physical Exam  Constitutional: She is oriented to person, place, and time. She appears well-developed and well-nourished. No distress.  obese  HENT:  Head: Normocephalic and atraumatic.  Abdominal: Soft. Bowel sounds are normal. She exhibits no distension. There is no tenderness. There is no rebound and no guarding.  Genitourinary: No vaginal discharge found.  Neurological: She is alert and oriented to person, place, and time.  Skin: Skin is warm and dry.  Psychiatric: She has a normal mood and affect. Her behavior is normal. Thought content normal.  Vitals reviewed.         Assessment & Plan:

## 2014-12-08 NOTE — Patient Instructions (Signed)
Schedule your complete physical at your convenience We'll notify you of your lab results and make any changes if needed We'll call you with your GYN appt Call with any questions or concerns Hang in there!  We'll figure this out!

## 2014-12-08 NOTE — Progress Notes (Signed)
Pre visit review using our clinic review tool, if applicable. No additional management support is needed unless otherwise documented below in the visit note. 

## 2014-12-08 NOTE — Assessment & Plan Note (Signed)
New.  Check wet prep and treat accordingly.

## 2014-12-08 NOTE — Assessment & Plan Note (Signed)
Chronic problem for pt.  Not currently on meds.  Has not followed up w/ GYN as recommended.  Will refer for complete evaluation and tx.  Pt expressed understanding and is in agreement w/ plan.

## 2014-12-11 ENCOUNTER — Encounter: Payer: Self-pay | Admitting: Family Medicine

## 2014-12-12 LAB — CERVICOVAGINAL ANCILLARY ONLY: WET PREP (BD AFFIRM): NEGATIVE

## 2014-12-14 ENCOUNTER — Emergency Department (HOSPITAL_BASED_OUTPATIENT_CLINIC_OR_DEPARTMENT_OTHER)
Admission: EM | Admit: 2014-12-14 | Discharge: 2014-12-14 | Disposition: A | Discharge status: Home / Self Care | Payer: 59 | Attending: Emergency Medicine | Admitting: Emergency Medicine

## 2014-12-14 ENCOUNTER — Encounter (HOSPITAL_BASED_OUTPATIENT_CLINIC_OR_DEPARTMENT_OTHER): Payer: Self-pay | Admitting: Emergency Medicine

## 2014-12-14 DIAGNOSIS — Y998 Other external cause status: Secondary | ICD-10-CM | POA: Diagnosis not present

## 2014-12-14 DIAGNOSIS — T148XXA Other injury of unspecified body region, initial encounter: Secondary | ICD-10-CM

## 2014-12-14 DIAGNOSIS — Y9389 Activity, other specified: Secondary | ICD-10-CM | POA: Insufficient documentation

## 2014-12-14 DIAGNOSIS — Z8742 Personal history of other diseases of the female genital tract: Secondary | ICD-10-CM | POA: Diagnosis not present

## 2014-12-14 DIAGNOSIS — S4991XA Unspecified injury of right shoulder and upper arm, initial encounter: Secondary | ICD-10-CM | POA: Diagnosis present

## 2014-12-14 DIAGNOSIS — E282 Polycystic ovarian syndrome: Secondary | ICD-10-CM | POA: Diagnosis not present

## 2014-12-14 DIAGNOSIS — M419 Scoliosis, unspecified: Secondary | ICD-10-CM | POA: Diagnosis not present

## 2014-12-14 DIAGNOSIS — Y92410 Unspecified street and highway as the place of occurrence of the external cause: Secondary | ICD-10-CM | POA: Insufficient documentation

## 2014-12-14 DIAGNOSIS — Z87891 Personal history of nicotine dependence: Secondary | ICD-10-CM | POA: Insufficient documentation

## 2014-12-14 DIAGNOSIS — S46911A Strain of unspecified muscle, fascia and tendon at shoulder and upper arm level, right arm, initial encounter: Secondary | ICD-10-CM | POA: Insufficient documentation

## 2014-12-14 DIAGNOSIS — E119 Type 2 diabetes mellitus without complications: Secondary | ICD-10-CM | POA: Diagnosis not present

## 2014-12-14 DIAGNOSIS — Z880 Allergy status to penicillin: Secondary | ICD-10-CM | POA: Insufficient documentation

## 2014-12-14 DIAGNOSIS — Z79899 Other long term (current) drug therapy: Secondary | ICD-10-CM | POA: Diagnosis not present

## 2014-12-14 HISTORY — DX: Scoliosis, unspecified: M41.9

## 2014-12-14 MED ORDER — CYCLOBENZAPRINE HCL 10 MG PO TABS: 10.0000 mg | ORAL_TABLET | Freq: Two times a day (BID) | ORAL | Status: DC | PRN

## 2014-12-14 MED ORDER — NAPROXEN 500 MG PO TABS: 500.0000 mg | ORAL_TABLET | Freq: Two times a day (BID) | ORAL | Status: DC
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
## 2014-12-14 NOTE — Discharge Instructions (Signed)

## 2014-12-18 ENCOUNTER — Telehealth: Payer: Self-pay | Admitting: Family Medicine
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
## 2014-12-21 ENCOUNTER — Ambulatory Visit: Payer: Self-pay | Admitting: Family Medicine

## 2014-12-22 ENCOUNTER — Ambulatory Visit (HOSPITAL_BASED_OUTPATIENT_CLINIC_OR_DEPARTMENT_OTHER)
Admission: RE | Admit: 2014-12-22 | Discharge: 2014-12-22 | Disposition: A | Discharge status: Home / Self Care | Payer: 59 | Source: Ambulatory Visit | Attending: Family Medicine | Admitting: Family Medicine

## 2014-12-22 ENCOUNTER — Ambulatory Visit (INDEPENDENT_AMBULATORY_CARE_PROVIDER_SITE_OTHER): Payer: 59 | Admitting: Family Medicine

## 2014-12-22 ENCOUNTER — Encounter: Payer: Self-pay | Admitting: Family Medicine

## 2014-12-22 DIAGNOSIS — R269 Unspecified abnormalities of gait and mobility: Secondary | ICD-10-CM

## 2014-12-22 DIAGNOSIS — N644 Mastodynia: Secondary | ICD-10-CM

## 2014-12-22 DIAGNOSIS — S0990XA Unspecified injury of head, initial encounter: Secondary | ICD-10-CM

## 2014-12-22 NOTE — Progress Notes (Signed)
Pre visit review using our clinic review tool, if applicable. No additional management support is needed unless otherwise documented below in the visit note. 
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
## 2014-12-22 NOTE — Assessment & Plan Note (Signed)
New per pt report.  She states family has commented on her listing towards 1 side and veering off when walking.  Not noted on PE today in office.  Based on observations, will refer to neuro for complete evaluation.

## 2014-12-22 NOTE — Assessment & Plan Note (Signed)
New to provider, ongoing for pt.  May be hormonal and related to PCOS but pt w/ L breast mass at 11 o'clock on PE today.  Based on this, will refer for diagnostic mammo.  Pt expressed understanding and is in agreement w/ plan.

## 2014-12-22 NOTE — Patient Instructions (Signed)
Follow up as needed We'll notify you of your CT results as soon as we know anything We'll call you with your Neuro appt for the changes your family has noticed We'll call you with your mammogram appt Call with any questions or concerns Hang in there!!!

## 2014-12-28 ENCOUNTER — Encounter: Payer: Self-pay | Admitting: Family Medicine

## 2014-12-28 ENCOUNTER — Other Ambulatory Visit: Payer: Self-pay | Admitting: General Practice

## 2015-01-04 ENCOUNTER — Other Ambulatory Visit: Payer: Self-pay | Admitting: Family Medicine

## 2015-01-04 DIAGNOSIS — N644 Mastodynia: Secondary | ICD-10-CM

## 2015-01-08 ENCOUNTER — Encounter: Payer: Self-pay | Admitting: Family Medicine

## 2015-01-08 MED ORDER — METFORMIN HCL 500 MG PO TABS: 500.0000 mg | ORAL_TABLET | Freq: Two times a day (BID) | ORAL | Status: DC

## 2015-01-08 NOTE — Telephone Encounter (Signed)
Med filled.  

## 2015-01-22 ENCOUNTER — Encounter: Payer: Self-pay | Admitting: Obstetrics & Gynecology

## 2015-01-31 ENCOUNTER — Ambulatory Visit: Payer: Self-pay | Admitting: Neurology

## 2015-01-31 ENCOUNTER — Telehealth: Payer: Self-pay | Admitting: Neurology

## 2015-01-31 NOTE — Telephone Encounter (Signed)
Pt no showed NP appt today with Dr. Shon MilletAdam Jaffe. No show letter mailed to pt. Referring office notified via EPIC referral / Sherri S>

## 2015-03-30 ENCOUNTER — Ambulatory Visit (INDEPENDENT_AMBULATORY_CARE_PROVIDER_SITE_OTHER): Payer: 59 | Admitting: Family Medicine

## 2015-03-30 ENCOUNTER — Encounter: Payer: Self-pay | Admitting: Family Medicine

## 2015-03-30 VITALS — BP 132/80 | HR 72 | Temp 98.2°F | Resp 18 | Ht 64.0 in | Wt 260.4 lb

## 2015-03-30 DIAGNOSIS — Z23 Encounter for immunization: Secondary | ICD-10-CM

## 2015-03-30 DIAGNOSIS — Z111 Encounter for screening for respiratory tuberculosis: Secondary | ICD-10-CM

## 2015-03-30 DIAGNOSIS — Z0184 Encounter for antibody response examination: Secondary | ICD-10-CM | POA: Diagnosis not present

## 2015-03-30 DIAGNOSIS — Z0000 Encounter for general adult medical examination without abnormal findings: Secondary | ICD-10-CM

## 2015-03-30 DIAGNOSIS — H539 Unspecified visual disturbance: Secondary | ICD-10-CM | POA: Diagnosis not present

## 2015-03-30 LAB — HEPATIC FUNCTION PANEL
ALT: 29 U/L (ref 0–35)
AST: 22 U/L (ref 0–37)
Albumin: 4.2 g/dL (ref 3.5–5.2)
Alkaline Phosphatase: 55 U/L (ref 39–117)
BILIRUBIN INDIRECT: 0.6 mg/dL (ref 0.2–1.2)
BILIRUBIN TOTAL: 0.7 mg/dL (ref 0.2–1.2)
Bilirubin, Direct: 0.1 mg/dL (ref 0.0–0.3)
TOTAL PROTEIN: 7.3 g/dL (ref 6.0–8.3)

## 2015-03-30 LAB — CBC WITH DIFFERENTIAL/PLATELET
BASOS PCT: 0 % (ref 0–1)
Basophils Absolute: 0 10*3/uL (ref 0.0–0.1)
EOS PCT: 2 % (ref 0–5)
Eosinophils Absolute: 0.2 10*3/uL (ref 0.0–0.7)
HCT: 39.5 % (ref 36.0–46.0)
HEMOGLOBIN: 13.3 g/dL (ref 12.0–15.0)
Lymphocytes Relative: 31 % (ref 12–46)
Lymphs Abs: 2.3 10*3/uL (ref 0.7–4.0)
MCH: 27.5 pg (ref 26.0–34.0)
MCHC: 33.7 g/dL (ref 30.0–36.0)
MCV: 81.8 fL (ref 78.0–100.0)
MONO ABS: 0.5 10*3/uL (ref 0.1–1.0)
MPV: 10.4 fL (ref 8.6–12.4)
Monocytes Relative: 6 % (ref 3–12)
Neutro Abs: 4.6 10*3/uL (ref 1.7–7.7)
Neutrophils Relative %: 61 % (ref 43–77)
Platelets: 300 10*3/uL (ref 150–400)
RBC: 4.83 MIL/uL (ref 3.87–5.11)
RDW: 13.5 % (ref 11.5–15.5)
WBC: 7.5 10*3/uL (ref 4.0–10.5)

## 2015-03-30 LAB — LIPID PANEL
CHOL/HDL RATIO: 4.4 ratio
Cholesterol: 158 mg/dL (ref 0–200)
HDL: 36 mg/dL — AB (ref >=46)
LDL Cholesterol: 97 mg/dL (ref 0–99)
Triglycerides: 126 mg/dL (ref <150)
VLDL: 25 mg/dL (ref 0–40)

## 2015-03-30 LAB — BASIC METABOLIC PANEL
BUN: 8 mg/dL (ref 6–23)
CHLORIDE: 103 meq/L (ref 96–112)
CO2: 27 mEq/L (ref 19–32)
Calcium: 9 mg/dL (ref 8.4–10.5)
Creat: 0.7 mg/dL (ref 0.50–1.10)
GLUCOSE: 82 mg/dL (ref 70–99)
Potassium: 4 mEq/L (ref 3.5–5.3)
SODIUM: 139 meq/L (ref 135–145)

## 2015-03-30 NOTE — Progress Notes (Signed)
   Subjective:    Patient ID: Brittany Lindsey, female    DOB: 20-Feb-1991, 24 y.o.   MRN: [id number]  HPI CPE- pt needs form completed for school.   Review of Systems Patient reports no vision/ hearing changes, adenopathy,fever, weight change,  persistant/recurrent hoarseness , swallowing issues, chest pain, palpitations, edema, persistant/recurrent cough, hemoptysis, dyspnea (rest/exertional/paroxysmal nocturnal), gastrointestinal bleeding (melena, rectal bleeding), abdominal pain, significant heartburn, bowel changes, GU symptoms (dysuria, hematuria, incontinence), Gyn symptoms (abnormal  bleeding, pain),  syncope, focal weakness, memory loss, numbness & tingling, skin/hair/nail changes, abnormal bruising or bleeding, anxiety, or depression.     Objective:   Physical Exam General Appearance:    Alert, cooperative, no distress, appears stated age  Head:    Normocephalic, without obvious abnormality, atraumatic  Eyes:    PERRL, conjunctiva/corneas clear, EOM's intact, fundi    benign, both eyes  Ears:    Normal TM's and external ear canals, both ears  Nose:   Nares normal, septum midline, mucosa normal, no drainage    or sinus tenderness  Throat:   Lips, mucosa, and tongue normal; teeth and gums normal  Neck:   Supple, symmetrical, trachea midline, no adenopathy;    Thyroid: no enlargement/tenderness/nodules  Back:     Symmetric, no curvature, ROM normal, no CVA tenderness  Lungs:     Clear to auscultation bilaterally, respirations unlabored  Chest Wall:    No tenderness or deformity   Heart:    Regular rate and rhythm, S1 and S2 normal, no murmur, rub   or gallop  Breast Exam:    Deferred to GYN  Abdomen:     Soft, non-tender, bowel sounds active all four quadrants,    no masses, no organomegaly  Genitalia:    Deferred to GYN  Rectal:    Extremities:   Extremities normal, atraumatic, no cyanosis or edema  Pulses:   2+ and symmetric all extremities  Skin:   Skin color, texture,  turgor normal, no rashes or lesions  Lymph nodes:   Cervical, supraclavicular, and axillary nodes normal  Neurologic:   CNII-XII intact, normal strength, sensation and reflexes    throughout          Assessment & Plan:

## 2015-03-30 NOTE — Progress Notes (Signed)
Pre visit review using our clinic review tool, if applicable. No additional management support is needed unless otherwise documented below in the visit note. 

## 2015-03-30 NOTE — Patient Instructions (Signed)
Come back on Monday to have your PPD read Schedule a 2nd PPD test in 2 weeks (this is the 2 step test that is required) We'll notify you of your lab results and make any changes if needed We'll call you with your eye exam appt Continue to work on healthy diet and regular exercise Call with any questions or concerns GOOD LUCK w/ externship!!

## 2015-03-31 LAB — VITAMIN D 25 HYDROXY (VIT D DEFICIENCY, FRACTURES): VIT D 25 HYDROXY: 19 ng/mL — AB (ref 30–100)

## 2015-03-31 LAB — TSH: TSH: 1.889 u[IU]/mL (ref 0.350–4.500)

## 2015-04-01 NOTE — Assessment & Plan Note (Signed)
Pt's PE WNL w/ exception of obesity.  Check labs.  Update Tdap for school.  PPD placed.  Needs 2 step- will need to repeat in 2 weeks after she has test read on Monday.  Anticipatory guidance provided.

## 2015-04-02 ENCOUNTER — Encounter: Payer: Self-pay | Admitting: Behavioral Health

## 2015-04-02 LAB — TB SKIN TEST
Induration: 0 mm
TB Skin Test: NEGATIVE

## 2015-04-02 LAB — VARICELLA ZOSTER ANTIBODY, IGG: VARICELLA IGG: 2866 {index} — AB (ref <135.00)

## 2015-04-02 MED ORDER — VITAMIN D (ERGOCALCIFEROL) 1.25 MG (50000 UNIT) PO CAPS: 50000.0000 [IU] | ORAL_CAPSULE | ORAL | Status: DC

## 2015-04-02 NOTE — Addendum Note (Signed)
Addended by: Dorette Grate on: 04/02/2015 08:15 AM   Modules accepted: Orders

## 2015-04-02 NOTE — Progress Notes (Signed)
Pre visit review using our clinic review tool, if applicable. No additional management support is needed unless otherwise documented below in the visit note.  Patient presented in the office for PPD reading. Result was negative. A second PPD has already been scheduled prior to this visit for 04/13/15.

## 2015-04-13 ENCOUNTER — Ambulatory Visit (INDEPENDENT_AMBULATORY_CARE_PROVIDER_SITE_OTHER): Payer: 59 | Admitting: *Deleted

## 2015-04-13 DIAGNOSIS — Z111 Encounter for screening for respiratory tuberculosis: Secondary | ICD-10-CM

## 2015-04-13 NOTE — Progress Notes (Signed)
Pre visit review using our clinic review tool, if applicable. No additional management support is needed unless otherwise documented below in the visit note.  Patient tolerated injection well.  Notified to return to office Monday 04/16/15 before 4pm.

## 2015-04-16 ENCOUNTER — Encounter: Payer: Self-pay | Admitting: *Deleted

## 2015-04-16 ENCOUNTER — Encounter: Payer: Self-pay | Admitting: Family Medicine

## 2015-04-16 LAB — TB SKIN TEST
Induration: 0 mm
TB SKIN TEST: NEGATIVE

## 2015-04-18 ENCOUNTER — Emergency Department (HOSPITAL_COMMUNITY)
Admission: EM | Admit: 2015-04-18 | Discharge: 2015-04-18 | Disposition: A | Discharge status: Home / Self Care | Payer: 59 | Attending: Emergency Medicine | Admitting: Emergency Medicine

## 2015-04-18 ENCOUNTER — Emergency Department (HOSPITAL_COMMUNITY): Payer: 59

## 2015-04-18 ENCOUNTER — Encounter (HOSPITAL_COMMUNITY): Payer: Self-pay | Admitting: Emergency Medicine

## 2015-04-18 DIAGNOSIS — Z79899 Other long term (current) drug therapy: Secondary | ICD-10-CM | POA: Insufficient documentation

## 2015-04-18 DIAGNOSIS — R197 Diarrhea, unspecified: Secondary | ICD-10-CM | POA: Insufficient documentation

## 2015-04-18 DIAGNOSIS — M419 Scoliosis, unspecified: Secondary | ICD-10-CM | POA: Insufficient documentation

## 2015-04-18 DIAGNOSIS — N7093 Salpingitis and oophoritis, unspecified: Secondary | ICD-10-CM

## 2015-04-18 DIAGNOSIS — N83519 Torsion of ovary and ovarian pedicle, unspecified side: Secondary | ICD-10-CM

## 2015-04-18 DIAGNOSIS — Z8639 Personal history of other endocrine, nutritional and metabolic disease: Secondary | ICD-10-CM | POA: Insufficient documentation

## 2015-04-18 DIAGNOSIS — Z87891 Personal history of nicotine dependence: Secondary | ICD-10-CM | POA: Insufficient documentation

## 2015-04-18 DIAGNOSIS — Z3202 Encounter for pregnancy test, result negative: Secondary | ICD-10-CM | POA: Insufficient documentation

## 2015-04-18 LAB — URINALYSIS, ROUTINE W REFLEX MICROSCOPIC
Glucose, UA: NEGATIVE mg/dL
KETONES UR: NEGATIVE mg/dL
LEUKOCYTES UA: NEGATIVE
NITRITE: NEGATIVE
PH: 5.5 (ref 5.0–8.0)
Protein, ur: NEGATIVE mg/dL
SPECIFIC GRAVITY, URINE: 1.036 — AB (ref 1.005–1.030)
UROBILINOGEN UA: 0.2 mg/dL (ref 0.0–1.0)

## 2015-04-18 LAB — COMPREHENSIVE METABOLIC PANEL
ALK PHOS: 62 U/L (ref 38–126)
ALT: 30 U/L (ref 14–54)
AST: 29 U/L (ref 15–41)
Albumin: 4 g/dL (ref 3.5–5.0)
Anion gap: 8 (ref 5–15)
BUN: 16 mg/dL (ref 6–20)
CO2: 25 mmol/L (ref 22–32)
Calcium: 9.4 mg/dL (ref 8.9–10.3)
Chloride: 107 mmol/L (ref 101–111)
Creatinine, Ser: 0.95 mg/dL (ref 0.44–1.00)
GFR calc non Af Amer: >60 mL/min (ref >60)
GLUCOSE: 115 mg/dL — AB (ref 65–99)
POTASSIUM: 3.4 mmol/L — AB (ref 3.5–5.1)
SODIUM: 140 mmol/L (ref 135–145)
TOTAL PROTEIN: 8.1 g/dL (ref 6.5–8.1)
Total Bilirubin: 0.6 mg/dL (ref 0.3–1.2)

## 2015-04-18 LAB — CBC
HEMATOCRIT: 40.5 % (ref 36.0–46.0)
HEMOGLOBIN: 13.5 g/dL (ref 12.0–15.0)
MCH: 27.7 pg (ref 26.0–34.0)
MCHC: 33.3 g/dL (ref 30.0–36.0)
MCV: 83.2 fL (ref 78.0–100.0)
Platelets: 278 10*3/uL (ref 150–400)
RBC: 4.87 MIL/uL (ref 3.87–5.11)
RDW: 12.5 % (ref 11.5–15.5)
WBC: 8.8 10*3/uL (ref 4.0–10.5)

## 2015-04-18 LAB — I-STAT BETA HCG BLOOD, ED (MC, WL, AP ONLY)

## 2015-04-18 LAB — URINE MICROSCOPIC-ADD ON

## 2015-04-18 LAB — WET PREP, GENITAL
CLUE CELLS WET PREP: NONE SEEN
Trich, Wet Prep: NONE SEEN
Yeast Wet Prep HPF POC: NONE SEEN

## 2015-04-18 MED ORDER — METRONIDAZOLE 500 MG PO TABS: 500.0000 mg | ORAL_TABLET | Freq: Two times a day (BID) | ORAL | Status: DC

## 2015-04-18 MED ORDER — CIPROFLOXACIN HCL 500 MG PO TABS: 500.0000 mg | ORAL_TABLET | Freq: Two times a day (BID) | ORAL | Status: DC

## 2015-04-18 MED ORDER — METRONIDAZOLE 500 MG PO TABS
500.0000 mg | ORAL_TABLET | Freq: Once | ORAL | Status: AC
  Administered 2015-04-18: 500 mg via ORAL
  Filled 2015-04-18: qty 1

## 2015-04-18 MED ORDER — CIPROFLOXACIN HCL 500 MG PO TABS
500.0000 mg | ORAL_TABLET | Freq: Once | ORAL | Status: AC
  Administered 2015-04-18: 500 mg via ORAL
  Filled 2015-04-18: qty 1

## 2015-04-18 MED ORDER — HYDROCODONE-ACETAMINOPHEN 5-325 MG PO TABS: 1.0000 | ORAL_TABLET | Freq: Four times a day (QID) | ORAL | Status: DC | PRN

## 2015-04-18 NOTE — ED Notes (Signed)
Pt states in Feb she was admitted for an ovarian problem. States over the last month that same pain has returned in right lower abdomin. Over the last few days the pain has gotten increasingly worse, also having nausea and diarrhea. Denies fever.

## 2015-04-18 NOTE — Discharge Instructions (Signed)
You must follow-up with [facility] in 2 weeks for recheck. Call in the morning to schedule a follow-up appointment. Take ciprofloxacin and Flagyl as prescribed. Do not drink alcohol while taking Flagyl as it will make you violently ill, vomiting. Return to [facility] if symptoms worsen. You may take Norco as needed for severe pain and ibuprofen 600 mg every 6 hours for mild-to-moderate pain.  Pelvic Inflammatory Disease Pelvic inflammatory disease (PID) refers to an infection in some or all of the female organs. The infection can be in the uterus, ovaries, fallopian tubes, or the surrounding tissues in the pelvis. PID can cause abdominal or pelvic pain that comes on suddenly (acute pelvic pain). PID is a serious infection because it can lead to lasting (chronic) pelvic pain or the inability to have children (infertile).  CAUSES  The infection is often caused by the normal bacteria found in the vaginal tissues. PID may also be caused by an infection that is spread during sexual contact. PID can also occur following:   The birth of a baby.   A miscarriage.   An abortion.   Major pelvic surgery.   The use of an intrauterine device (IUD).   A sexual assault.  RISK FACTORS Certain factors can put a person at higher risk for PID, such as:  Being younger than 25 years.  Being sexually active at [place] age.  Usingnonbarrier contraception.  Havingmultiple sexual partners.  Having sex with someone who has symptoms of a genital infection.  Using oral contraception. Other times, certain behaviors can increase the possibility of getting PID, such as:  Having sex during your period.  Using a vaginal douche.  Having an intrauterine device (IUD) in place. SYMPTOMS   Abdominal or pelvic pain.   Fever.   Chills.   Abnormal vaginal discharge.  Abnormal uterine bleeding.   Unusual pain shortly after finishing your period. DIAGNOSIS  Your caregiver will choose  some of the following methods to make a diagnosis, such as:   Performinga physical exam and history. A pelvic exam typically reveals a very tender uterus and surrounding pelvis.   Ordering laboratory tests including a pregnancy test, blood tests, and urine test.  Orderingcultures of the vagina and cervix to check for a sexually transmitted infection (STI).  Performing an ultrasound.   Performing a laparoscopic procedure to look inside the pelvis.  TREATMENT   Antibiotic medicines may be prescribed and taken by mouth.   Sexual partners may be treated when the infection is caused by a sexually transmitted disease (STD).   Hospitalization may be needed to give antibiotics intravenously.  Surgery may be needed, but this is rare. It may take weeks until you are completely well. If you are diagnosed with PID, you should also be checked for human immunodeficiency virus (HIV). HOME CARE INSTRUCTIONS   If given, take your antibiotics as directed. Finish the medicine even if you start to feel better.   Only take over-the-counter or prescription medicines for pain, discomfort, or fever as directed by your caregiver.   Do not have sexual intercourse until treatment is completed or as directed by your caregiver. If PID is confirmed, your recent sexual partner(s) will need treatment.   Keep your follow-up appointments. SEEK MEDICAL CARE IF:   You have increased or abnormal vaginal discharge.   You need prescription medicine for your pain.   You vomit.   You cannot take your medicines.   Your partner has an STD.  SEEK IMMEDIATE MEDICAL CARE IF:  You have a fever.   You have increased abdominal or pelvic pain.   You have chills.   You have pain when you urinate.   You are not better after 72 hours following treatment.  MAKE SURE YOU:   Understand these instructions.  Will watch your condition.  Will get help right away if you are not doing well or get  worse. Document Released: 08/25/2005 Document Revised: 12/20/2012 Document Reviewed: 08/21/2011 [facility] Patient Information 2015 [place], [place]. This information is not intended to replace advice given to you by your health care provider. Make sure you discuss any questions you have with your health care provider.

## 2015-04-18 NOTE — ED Provider Notes (Signed)
CSN: [id number]     Arrival date & time 04/18/15  1641 History   First MD Initiated Contact with Patient 04/18/15 1828     Chief Complaint  Patient presents with  . Abdominal Pain  . Diarrhea  . Nausea    (Consider location/radiation/quality/duration/timing/severity/associated sxs/prior Treatment) HPI Comments: 24 year old G0P0 female with a history of PCOS and TOA presents to the ED for further evaluation of abdominal pain. Patient states that she has been experiencing intermittent pain in her right lower quadrant over the past month. Pain has become more frequent over the past 48 hours. Patient describes the pain as sharp and nonradiating. She denies any modifying factors of her symptoms. She states that pain feels similar to when she was diagnosed with a TOA. She required hospitalization and IV antibiotics for 1 week for this. Patient reports that her symptoms have been associated with nausea as well as diarrhea. No medications taken prior to arrival. No fever, chest pain, shortness of breath, urinary symptoms, melanoma or hematochezia, or vomiting. No history of abdominal surgeries.  Patient is a 24 y.o. female presenting with abdominal pain and diarrhea. The history is provided by the patient. No language interpreter was used.  Abdominal Pain Associated symptoms: diarrhea, nausea and vaginal discharge   Associated symptoms: no chest pain, no dysuria, no fever, no hematuria, no shortness of breath, no vaginal bleeding and no vomiting   Diarrhea Associated symptoms: abdominal pain   Associated symptoms: no fever and no vomiting     Past Medical History  Diagnosis Date  . PCOS (polycystic ovarian syndrome)   . Tubo-ovarian abscess Feb 2016  . Scoliosis    Past Surgical History  Procedure Laterality Date  . No past surgeries     Family History  Problem Relation Age of Onset  . Diabetes Mother   . Seizures Mother   . Hyperlipidemia Mother    Social History  Substance Use Topics   . Smoking status: Former Games developer  . Smokeless tobacco: Never Used  . Alcohol Use: No   OB History    No data available      Review of Systems  Constitutional: Negative for fever.  Respiratory: Negative for shortness of breath.   Cardiovascular: Negative for chest pain.  Gastrointestinal: Positive for nausea, abdominal pain and diarrhea. Negative for vomiting.  Genitourinary: Positive for vaginal discharge. Negative for dysuria, hematuria and vaginal bleeding.  All other systems reviewed and are negative.   Allergies  Mushroom extract complex and Penicillins  Home Medications   Prior to Admission medications   Medication Sig Start Date End Date Taking? Authorizing Provider  metFORMIN (GLUCOPHAGE) 500 MG tablet Take 1 tablet (500 mg total) by mouth 2 (two) times daily with a meal. 01/08/15  Yes Sheliah Hatch, MD  ciprofloxacin (CIPRO) 500 MG tablet Take 1 tablet (500 mg total) by mouth 2 (two) times daily. 04/18/15   Antony Madura, PA-C  HYDROcodone-acetaminophen (NORCO/VICODIN) 5-325 MG per tablet Take 1-2 tablets by mouth every 6 (six) hours as needed for severe pain. 04/18/15   Antony Madura, PA-C  metroNIDAZOLE (FLAGYL) 500 MG tablet Take 1 tablet (500 mg total) by mouth 2 (two) times daily. 04/18/15   Antony Madura, PA-C  Vitamin D, Ergocalciferol, (DRISDOL) 50000 UNITS CAPS capsule Take 1 capsule (50,000 Units total) by mouth every 7 (seven) days. Patient not taking: Reported on 04/18/2015 04/02/15   Sheliah Hatch, MD   BP 107/51 mmHg  Pulse 75  Temp(Src) 98.4 F (36.9  C) (Oral)  Resp 18  SpO2 97%   Physical Exam  Constitutional: She is oriented to person, place, and time. She appears well-developed and well-nourished. No distress.  Nontoxic/nonseptic appearing  HENT:  Head: Normocephalic and atraumatic.  Eyes: Conjunctivae and EOM are normal. No scleral icterus.  Neck: Normal range of motion.  Cardiovascular: Normal rate, regular rhythm and intact distal pulses.    Pulmonary/Chest: Effort normal and breath sounds normal. No respiratory distress. She has no wheezes. She has no rales.  Respirations even and unlabored  Abdominal: Soft. She exhibits no distension. There is tenderness. There is no rebound and no guarding.  Mild RLQ and suprapubic TTP. No masses or peritoneal signs. Abdomen soft, obese.  Genitourinary: There is no rash, tenderness, lesion or injury on the right labia. There is no rash, tenderness, lesion or injury on the left labia. Uterus is not tender. Cervix exhibits no motion tenderness and no friability. Right adnexum displays no mass, no tenderness and no fullness. Left adnexum displays no mass, no tenderness and no fullness. No bleeding in the vagina. No foreign body around the vagina. Vaginal discharge (mild, watery, opaque) found.  Musculoskeletal: Normal range of motion.  Neurological: She is alert and oriented to person, place, and time. She exhibits normal muscle tone. Coordination normal.  Skin: Skin is warm and dry. No rash noted. She is not diaphoretic. No erythema. No pallor.  Psychiatric: She has a normal mood and affect. Her behavior is normal.  Nursing note and vitals reviewed.   ED Course  Procedures (including critical care time) Labs Review Labs Reviewed  WET PREP, GENITAL - Abnormal; Notable for the following:    WBC, Wet Prep HPF POC FEW (*)    All other components within normal limits  COMPREHENSIVE METABOLIC PANEL - Abnormal; Notable for the following:    Potassium 3.4 (*)    Glucose, Bld 115 (*)    All other components within normal limits  URINALYSIS, ROUTINE W REFLEX MICROSCOPIC (NOT AT [facility]) - Abnormal; Notable for the following:    Color, Urine AMBER (*)    APPearance CLOUDY (*)    Specific Gravity, Urine 1.036 (*)    Hgb urine dipstick TRACE (*)    Bilirubin Urine SMALL (*)    All other components within normal limits  URINE MICROSCOPIC-ADD ON - Abnormal; Notable for the following:    Squamous  Epithelial / LPF MANY (*)    All other components within normal limits  CBC  I-STAT BETA HCG BLOOD, ED ([facility], [facility], [facility] ONLY)  GC/CHLAMYDIA PROBE AMP ([place]) NOT AT [facility]    Imaging Review US Pelvis Complete  04/18/2015   CLINICAL DATA:  Right lower quadrant pain, history of tubo-ovarian abscess  EXAM: TRANSABDOMINAL AND TRANSVAGINAL ULTRASOUND OF PELVIS  DOPPLER ULTRASOUND OF OVARIES  TECHNIQUE: Both transabdominal and transvaginal ultrasound examinations of the pelvis were performed. Transabdominal technique was performed for global imaging of the pelvis including uterus, ovaries, adnexal regions, and pelvic cul-de-sac.  It was necessary to proceed with endovaginal exam following the transabdominal exam to visualize the endometrium and ovaries. Color and duplex Doppler ultrasound was utilized to evaluate blood flow to the ovaries.  COMPARISON:  10/17/2014  FINDINGS: Uterus  Measurements: 7.1 x 3.4 x 3.4 cm. No fibroids or other mass visualized.  Endometrium  Thickness: 7.9 mm.  No focal abnormality visualized.  Right ovary  Measurements: 3 x 3.8 x 3 cm. Right adnexal anechoic mass measuring 1.5 x 1.1 x 1.8 cm with increased  through transmission .  Left ovary  Measurements: 3.7 x 2.6 x 2.4 cm. Two left adnexal anechoic masses measuring 1.8 x 1.4 x 2.4 cm and 1.1 x 0.6 x 8 1 cm with increased through transmission .  Pulsed Doppler evaluation of both ovaries demonstrates normal low-resistance arterial and venous waveforms.  Other findings  No pelvic free fluid. Right adnexal tubular structure measuring 8.7 x 2.6 x 2.5 cm concerning for hydrosalpinx.  IMPRESSION: 1. No ovarian torsion. 2. Right adnexal tubular structure measuring 8.7 x 2.6 x 2.5 cm concerning for hydrosalpinx. 3. Bilateral adnexal cystic structures which may reflect exophytic cysts versus parovarian cyst versus adnexal fluid collections.   Electronically Signed   By: Elige Ko   On: 04/18/2015 20:31   [place] Art/ven Flow Abd Pelv  Doppler  04/18/2015   CLINICAL DATA:  Right lower quadrant pain, history of tubo-ovarian abscess  EXAM: TRANSABDOMINAL AND TRANSVAGINAL ULTRASOUND OF PELVIS  DOPPLER ULTRASOUND OF OVARIES  TECHNIQUE: Both transabdominal and transvaginal ultrasound examinations of the pelvis were performed. Transabdominal technique was performed for global imaging of the pelvis including uterus, ovaries, adnexal regions, and pelvic cul-de-sac.  It was necessary to proceed with endovaginal exam following the transabdominal exam to visualize the endometrium and ovaries. Color and duplex Doppler ultrasound was utilized to evaluate blood flow to the ovaries.  COMPARISON:  10/17/2014  FINDINGS: Uterus  Measurements: 7.1 x 3.4 x 3.4 cm. No fibroids or other mass visualized.  Endometrium  Thickness: 7.9 mm.  No focal abnormality visualized.  Right ovary  Measurements: 3 x 3.8 x 3 cm. Right adnexal anechoic mass measuring 1.5 x 1.1 x 1.8 cm with increased through transmission .  Left ovary  Measurements: 3.7 x 2.6 x 2.4 cm. Two left adnexal anechoic masses measuring 1.8 x 1.4 x 2.4 cm and 1.1 x 0.6 x 8 1 cm with increased through transmission .  Pulsed Doppler evaluation of both ovaries demonstrates normal low-resistance arterial and venous waveforms.  Other findings  No pelvic free fluid. Right adnexal tubular structure measuring 8.7 x 2.6 x 2.5 cm concerning for hydrosalpinx.  IMPRESSION: 1. No ovarian torsion. 2. Right adnexal tubular structure measuring 8.7 x 2.6 x 2.5 cm concerning for hydrosalpinx. 3. Bilateral adnexal cystic structures which may reflect exophytic cysts versus parovarian cyst versus adnexal fluid collections.   Electronically Signed   By: Elige Ko   On: 04/18/2015 20:31     EKG Interpretation None      MDM   Final diagnoses:  TOA (tubo-ovarian abscess)    24 year old female with a history of right tubo-ovarian abscess presents to the emergency department for worsening right lower quadrant abdominal  pain over the past month, more so over the past 2 days. Patient is afebrile and hemodynamically stable. She has no leukocytosis. GU exam is noncontributory; no cervical motion tenderness or adnexal tenderness appreciated on exam.  Ultrasound obtained for further evaluation of symptoms. This shows a persistent hydrosalpinx of 8.7 x 2.6 x 2.5 cm. I have consulted with faculty practice at [facility] who reports that this is likely the patient came abscess for which she was treated in February. Patient neglected to follow-up after this hospitalization. Faculty practice physician reports that this is now a, likely, chronic abscess. He explains that the patient will likely need removal of this adnexa in the future. He recommends outpatient treatment with 500 mg ciprofloxacin twice a day as well as 500 mg Flagyl twice a day over the course of  2 weeks.  Plan discussed with patient and I have emphasized the need for her to follow-up in 2 weeks for recheck of her symptoms. Patient verbalizes understanding. Patient advised to go to [facility] should she experience worsening of her symptoms prior to follow-up. Patient given prescription for ciprofloxacin and Flagyl as well as Norco for pain control. Return precautions discussed and provided. Patient agreeable to plan with no unaddressed concerns. Patient discharged in good condition; VSS.   Filed Vitals:   04/18/15 1719 04/18/15 1722 04/18/15 2039  BP:  130/83 107/51  Pulse: 103  75  Temp: 98.5 F (36.9 C)  98.4 F (36.9 C)  TempSrc: Oral  Oral  Resp: 18  18  SpO2: 96%  97%     Antony Madura, PA-C 04/18/15 2220  Marily Memos, MD 04/19/15 1453

## 2015-04-18 NOTE — ED Notes (Signed)
US at bedside

## 2015-04-19 LAB — GC/CHLAMYDIA PROBE AMP (~~LOC~~) NOT AT ARMC
Chlamydia: POSITIVE — AB
NEISSERIA GONORRHEA: NEGATIVE

## 2015-04-20 ENCOUNTER — Telehealth (HOSPITAL_COMMUNITY): Payer: Self-pay

## 2015-04-20 ENCOUNTER — Encounter: Payer: Self-pay | Admitting: Family Medicine

## 2015-04-20 NOTE — Telephone Encounter (Signed)
Results received from [facility].  (+) Chlamydia.  No antibiotic treatment or prescription given for STD.  Chart to MD office for review.  DHHS form attached. 

## 2015-04-21 ENCOUNTER — Encounter: Payer: Self-pay | Admitting: Family Medicine

## 2015-04-22 ENCOUNTER — Encounter (HOSPITAL_COMMUNITY): Payer: Self-pay | Admitting: Emergency Medicine

## 2015-04-22 ENCOUNTER — Telehealth (HOSPITAL_COMMUNITY): Payer: Self-pay | Admitting: Emergency Medicine

## 2015-04-22 ENCOUNTER — Emergency Department (HOSPITAL_COMMUNITY)
Admission: EM | Admit: 2015-04-22 | Discharge: 2015-04-22 | Disposition: A | Discharge status: Home / Self Care | Payer: 59 | Attending: Emergency Medicine | Admitting: Emergency Medicine

## 2015-04-22 DIAGNOSIS — A64 Unspecified sexually transmitted disease: Secondary | ICD-10-CM

## 2015-04-22 DIAGNOSIS — Z8742 Personal history of other diseases of the female genital tract: Secondary | ICD-10-CM | POA: Diagnosis not present

## 2015-04-22 DIAGNOSIS — Z79899 Other long term (current) drug therapy: Secondary | ICD-10-CM | POA: Insufficient documentation

## 2015-04-22 DIAGNOSIS — Z202 Contact with and (suspected) exposure to infections with a predominantly sexual mode of transmission: Secondary | ICD-10-CM | POA: Diagnosis present

## 2015-04-22 DIAGNOSIS — M419 Scoliosis, unspecified: Secondary | ICD-10-CM | POA: Diagnosis not present

## 2015-04-22 DIAGNOSIS — Z792 Long term (current) use of antibiotics: Secondary | ICD-10-CM | POA: Diagnosis not present

## 2015-04-22 DIAGNOSIS — Z8639 Personal history of other endocrine, nutritional and metabolic disease: Secondary | ICD-10-CM | POA: Diagnosis not present

## 2015-04-22 DIAGNOSIS — Z87891 Personal history of nicotine dependence: Secondary | ICD-10-CM | POA: Insufficient documentation

## 2015-04-22 DIAGNOSIS — Z880 Allergy status to penicillin: Secondary | ICD-10-CM | POA: Diagnosis not present

## 2015-04-22 MED ORDER — CEFTRIAXONE SODIUM 250 MG IJ SOLR
250.0000 mg | Freq: Once | INTRAMUSCULAR | Status: AC
  Administered 2015-04-22: 250 mg via INTRAMUSCULAR
  Filled 2015-04-22: qty 250

## 2015-04-22 MED ORDER — AZITHROMYCIN 250 MG PO TABS
1000.0000 mg | ORAL_TABLET | Freq: Once | ORAL | Status: AC
  Administered 2015-04-22: 1000 mg via ORAL
  Filled 2015-04-22: qty 4

## 2015-04-22 MED ORDER — LIDOCAINE HCL 1 % IJ SOLN
INTRAMUSCULAR | Status: AC
  Administered 2015-04-22: 0.9 mL
  Filled 2015-04-22: qty 20

## 2015-04-22 NOTE — Discharge Instructions (Signed)
Sexually Transmitted Disease A sexually transmitted disease (STD) is a disease or infection often passed to another person during sex. However, STDs can be passed through nonsexual ways. An STD can be passed through:  Spit (saliva).  Semen.  Blood.  Mucus from the vagina.  Pee (urine). HOW CAN I LESSEN MY CHANCES OF GETTING AN STD?  Use:  Latex condoms.  Water-soluble lubricants with condoms. Do not use petroleum jelly or oils.  Dental dams. These are small pieces of latex that are used as a barrier during oral sex.  Avoid having more than one sex partner.  Do not have sex with someone who has other sex partners.  Do not have sex with anyone you do not know or who is at high risk for an STD.  Avoid risky sex that can break your skin.  Do not have sex if you have open sores on your mouth or skin.  Avoid drinking too much alcohol or taking illegal drugs. Alcohol and drugs can affect your good judgment.  Avoid oral and anal sex acts.  Get shots (vaccines) for HPV and hepatitis.  If you are at risk of being infected with HIV, it is advised that you take a certain medicine daily to prevent HIV infection. This is called pre-exposure prophylaxis (PrEP). You may be at risk if:  You are a man who has sex with other men (MSM).  You are attracted to the opposite sex (heterosexual) and are having sex with more than one partner.  You take drugs with a needle.  You have sex with someone who has HIV.  Talk with your doctor about if you are at high risk of being infected with HIV. If you begin to take PrEP, get tested for HIV first. Get tested every 3 months for as long as you are taking PrEP. WHAT SHOULD I DO IF I THINK I HAVE AN STD?  See your doctor.  Tell your sex partner(s) that you have an STD. They should be tested and treated.  Do not have sex until your doctor says it is okay. WHEN SHOULD I GET HELP? Get help right away if:  You have bad belly (abdominal)  pain.  You are a man and have puffiness (swelling) or pain in your testicles.  You are a woman and have puffiness in your vagina. Document Released: 10/02/2004 Document Revised: 08/30/2013 Document Reviewed: 02/18/2013 ExitCare Patient Information 2015 [employer]. This information is not intended to replace advice given to you by your health care provider. Make sure you discuss any questions you have with your health care provider.  

## 2015-04-22 NOTE — ED Notes (Signed)
Pt states that she was called back from her visit last week and was told she has chlamydia. Was not given antibiotics last week and has come in for treatment. Alert and oriented.

## 2015-04-22 NOTE — ED Provider Notes (Signed)
CSN: [id number]     Arrival date & time 04/22/15  2009 History  This chart was scribed for non-physician practitioner Elpidio Anis, PA-C working with Marily Memos, MD by Lyndel Safe, ED Scribe. This patient was seen in room WTR5/WTR5 and the patient's care was started at 8:54 PM.   Chief Complaint  Patient presents with  . Medication Refill   The history is provided by the patient. No language interpreter was used.   HPI Comments: Brittany Lindsey is a 24 y.o. female who presents to the Emergency Department complaining of an exposure to chlamydia. Pt was contacted after her visit to the ED last week and was told she is positive for chlamydia and told to return for treatment. She denies having any symptoms associated with her diagnosis.   Past Medical History  Diagnosis Date  . PCOS (polycystic ovarian syndrome)   . Tubo-ovarian abscess Feb 2016  . Scoliosis    Past Surgical History  Procedure Laterality Date  . No past surgeries     Family History  Problem Relation Age of Onset  . Diabetes Mother   . Seizures Mother   . Hyperlipidemia Mother    Social History  Substance Use Topics  . Smoking status: Former Games developer  . Smokeless tobacco: Never Used  . Alcohol Use: No   OB History    No data available     Review of Systems  Genitourinary: Negative for vaginal discharge.   Allergies  Mushroom extract complex and Penicillins  Home Medications   Prior to Admission medications   Medication Sig Start Date End Date Taking? Authorizing Provider  ciprofloxacin (CIPRO) 500 MG tablet Take 1 tablet (500 mg total) by mouth 2 (two) times daily. 04/18/15  Yes Antony Madura, PA-C  metroNIDAZOLE (FLAGYL) 500 MG tablet Take 1 tablet (500 mg total) by mouth 2 (two) times daily. 04/18/15  Yes Antony Madura, PA-C  HYDROcodone-acetaminophen (NORCO/VICODIN) 5-325 MG per tablet Take 1-2 tablets by mouth every 6 (six) hours as needed for severe pain. 04/18/15   Antony Madura, PA-C  metFORMIN  (GLUCOPHAGE) 500 MG tablet Take 1 tablet (500 mg total) by mouth 2 (two) times daily with a meal. Patient not taking: Reported on 04/22/2015 01/08/15   Sheliah Hatch, MD  Vitamin D, Ergocalciferol, (DRISDOL) 50000 UNITS CAPS capsule Take 1 capsule (50,000 Units total) by mouth every 7 (seven) days. Patient not taking: Reported on 04/18/2015 04/02/15   Sheliah Hatch, MD   BP 138/81 mmHg  Pulse 68  Temp(Src) 98.5 F (36.9 C) (Oral)  Resp 18  SpO2 100% Physical Exam  Constitutional: She appears well-developed and well-nourished.  HENT:  Head: Normocephalic and atraumatic.  Eyes: Conjunctivae are normal. Right eye exhibits no discharge. Left eye exhibits no discharge.  Pulmonary/Chest: Effort normal. No respiratory distress.  Neurological: She is alert. Coordination normal.  Skin: Skin is warm and dry. No rash noted. She is not diaphoretic. No erythema.  Psychiatric: She has a normal mood and affect.  Nursing note and vitals reviewed.  ED Course  Procedures  DIAGNOSTIC STUDIES: Oxygen Saturation is 100% on RA, normal by my interpretation.    COORDINATION OF CARE: 8:56 PM Discussed treatment plan with pt. Will order Zithromax and Rocephin. Pt acknowledges and agrees to plan.   Labs Review Labs Reviewed - No data to display  Imaging Review No results found.    EKG Interpretation None      MDM   Final diagnoses:  None    1. STD  Patient called to return to the ED for treatment of positive chlamydia test performed during visit of 04/18/15. Patient given 1g Azithromycin and 250mg  IM Rocephin.  I personally performed the services described in this documentation, which was scribed in my presence. The recorded information has been reviewed and is accurate.     Elpidio Anis, PA-C 04/22/15 2132  Marily Memos, MD 04/24/15 0111

## 2015-04-22 NOTE — Telephone Encounter (Signed)
Post ED Visit - Positive Culture Follow-up: Successful Patient Follow-Up  Positive Chlamydia culture   Patient discharged without antimicrobial prescription and treatment is now indicated  Organism is resistant to prescribed ED discharge antimicrobial  Patient with positive blood cultures  Changes discussed with ED provider: Mirian Mo, MD New antibiotic prescription:  Doxycycline 100 mg PO BID x 14 days Called to [facility] outpatient pharmacy [phone]  Contacted patient, date 04/22/15, time 1700  ID verified x three, patient notified of positive Chlamydia and need for treatment, STD instructions provided, RX called to [facility] outpatient pharmacy [phone]  Norm Parcel [facility] 04/22/2015, 5:21 PM

## 2015-04-23 ENCOUNTER — Encounter: Payer: Self-pay | Admitting: Family Medicine

## 2015-05-11 ENCOUNTER — Ambulatory Visit (INDEPENDENT_AMBULATORY_CARE_PROVIDER_SITE_OTHER): Payer: 59 | Admitting: Obstetrics & Gynecology

## 2015-05-11 ENCOUNTER — Encounter: Payer: Self-pay | Admitting: Obstetrics & Gynecology

## 2015-05-11 VITALS — BP 127/74 | HR 83 | Ht 63.0 in | Wt 256.7 lb

## 2015-05-11 DIAGNOSIS — A749 Chlamydial infection, unspecified: Secondary | ICD-10-CM

## 2015-05-11 DIAGNOSIS — N7011 Chronic salpingitis: Secondary | ICD-10-CM

## 2015-05-11 MED ORDER — DOXYCYCLINE HYCLATE 100 MG PO CAPS: 100.0000 mg | ORAL_CAPSULE | Freq: Two times a day (BID) | ORAL | Status: DC

## 2015-05-11 NOTE — Progress Notes (Signed)
Patient ID: Brittany Lindsey, female   DOB: 08-Nov-1990, 24 y.o.   MRN: [id number] History:  24 y.o. No obstetric history on file. here today for eval of pelvic pain.  Pt has recently ybeen dx'd with Chlamydia and was treated along with her partner but, she reports that they have been sexually active within the 2 weeks after treatment. She reports that she has some level of discomfort but, not pain on the right with intercourse.  She denies discharge currently. Pt reports that she has had irreg cycles prev.  Wants an eval after this is cleared up.    The following portions of the patient's history were reviewed and updated as appropriate: allergies, current medications, past family history, past medical history, past social history, past surgical history and problem list.  Review of Systems:  Pertinent items are noted in HPI.  Objective:  Physical Exam Blood pressure 127/74, pulse 83, height 5\' 3"  (1.6 m), weight 256 lb 11.2 oz (116.438 kg), last menstrual period 11/09/2014. Gen: NAD Abd: Soft, nontender and nondistended; obese Pelvic: Normal appearing external genitalia; No CMT; Small uterus, no other palpable masses, no uterine or adnexal tenderness  Labs and Imaging US Pelvis Complete  04/18/2015   CLINICAL DATA:  Right lower quadrant pain, history of tubo-ovarian abscess  EXAM: TRANSABDOMINAL AND TRANSVAGINAL ULTRASOUND OF PELVIS  DOPPLER ULTRASOUND OF OVARIES  TECHNIQUE: Both transabdominal and transvaginal ultrasound examinations of the pelvis were performed. Transabdominal technique was performed for global imaging of the pelvis including uterus, ovaries, adnexal regions, and pelvic cul-de-sac.  It was necessary to proceed with endovaginal exam following the transabdominal exam to visualize the endometrium and ovaries. Color and duplex Doppler ultrasound was utilized to evaluate blood flow to the ovaries.  COMPARISON:  10/17/2014  FINDINGS: Uterus  Measurements: 7.1 x 3.4 x 3.4 cm. No fibroids  or other mass visualized.  Endometrium  Thickness: 7.9 mm.  No focal abnormality visualized.  Right ovary  Measurements: 3 x 3.8 x 3 cm. Right adnexal anechoic mass measuring 1.5 x 1.1 x 1.8 cm with increased through transmission .  Left ovary  Measurements: 3.7 x 2.6 x 2.4 cm. Two left adnexal anechoic masses measuring 1.8 x 1.4 x 2.4 cm and 1.1 x 0.6 x 8 1 cm with increased through transmission .  Pulsed Doppler evaluation of both ovaries demonstrates normal low-resistance arterial and venous waveforms.  Other findings  No pelvic free fluid. Right adnexal tubular structure measuring 8.7 x 2.6 x 2.5 cm concerning for hydrosalpinx.  IMPRESSION: 1. No ovarian torsion. 2. Right adnexal tubular structure measuring 8.7 x 2.6 x 2.5 cm concerning for hydrosalpinx. 3. Bilateral adnexal cystic structures which may reflect exophytic cysts versus parovarian cyst versus adnexal fluid collections.   Electronically Signed   By: Elige Ko   On: 04/18/2015 20:31   [place] Art/ven Flow Abd Pelv Doppler  04/18/2015   CLINICAL DATA:  Right lower quadrant pain, history of tubo-ovarian abscess  EXAM: TRANSABDOMINAL AND TRANSVAGINAL ULTRASOUND OF PELVIS  DOPPLER ULTRASOUND OF OVARIES  TECHNIQUE: Both transabdominal and transvaginal ultrasound examinations of the pelvis were performed. Transabdominal technique was performed for global imaging of the pelvis including uterus, ovaries, adnexal regions, and pelvic cul-de-sac.  It was necessary to proceed with endovaginal exam following the transabdominal exam to visualize the endometrium and ovaries. Color and duplex Doppler ultrasound was utilized to evaluate blood flow to the ovaries.  COMPARISON:  10/17/2014  FINDINGS: Uterus  Measurements: 7.1 x 3.4 x 3.4 cm. No fibroids or  other mass visualized.  Endometrium  Thickness: 7.9 mm.  No focal abnormality visualized.  Right ovary  Measurements: 3 x 3.8 x 3 cm. Right adnexal anechoic mass measuring 1.5 x 1.1 x 1.8 cm with increased through  transmission .  Left ovary  Measurements: 3.7 x 2.6 x 2.4 cm. Two left adnexal anechoic masses measuring 1.8 x 1.4 x 2.4 cm and 1.1 x 0.6 x 8 1 cm with increased through transmission .  Pulsed Doppler evaluation of both ovaries demonstrates normal low-resistance arterial and venous waveforms.  Other findings  No pelvic free fluid. Right adnexal tubular structure measuring 8.7 x 2.6 x 2.5 cm concerning for hydrosalpinx.  IMPRESSION: 1. No ovarian torsion. 2. Right adnexal tubular structure measuring 8.7 x 2.6 x 2.5 cm concerning for hydrosalpinx. 3. Bilateral adnexal cystic structures which may reflect exophytic cysts versus parovarian cyst versus adnexal fluid collections.   Electronically Signed   By: Elige Ko   On: 04/18/2015 20:31    Assessment & Plan:  PID with hydrosalpinx- I have reviewed with pt the potential long term sequela of chlamydia including, but not limited to,  increased risk of infertility and ectopic pregnancy  Doxycycline  bid x 10 days Needs TOC in 4 weeks Rec condom use at all times or abstinence  Myleen Brailsford L. Harraway-Smith, M.D., Brittany Lindsey

## 2015-05-11 NOTE — Patient Instructions (Signed)
Pelvic Inflammatory Disease °Pelvic inflammatory disease (PID) refers to an infection in some or all of the female organs. The infection can be in the uterus, ovaries, fallopian tubes, or the surrounding tissues in the pelvis. PID can cause abdominal or pelvic pain that comes on suddenly (acute pelvic pain). PID is a serious infection because it can lead to lasting (chronic) pelvic pain or the inability to have children (infertile).  °CAUSES  °The infection is often caused by the normal bacteria found in the vaginal tissues. PID may also be caused by an infection that is spread during sexual contact. PID can also occur following:  °· The birth of a baby.   °· A miscarriage.   °· An abortion.   °· Major pelvic surgery.   °· The use of an intrauterine device (IUD).   °· A sexual assault.   °RISK FACTORS °Certain factors can put a person at higher risk for PID, such as: °· Being younger than 25 years. °· Being sexually active at a young age. °· Using nonbarrier contraception. °· Having multiple sexual partners. °· Having sex with someone who has symptoms of a genital infection. °· Using oral contraception. °Other times, certain behaviors can increase the possibility of getting PID, such as: °· Having sex during your period. °· Using a vaginal douche. °· Having an intrauterine device (IUD) in place. °SYMPTOMS  °· Abdominal or pelvic pain.   °· Fever.   °· Chills.   °· Abnormal vaginal discharge. °· Abnormal uterine bleeding.   °· Unusual pain shortly after finishing your period. °DIAGNOSIS  °Your caregiver will choose some of the following methods to make a diagnosis, such as:  °· Performing a physical exam and history. A pelvic exam typically reveals a very tender uterus and surrounding pelvis.   °· Ordering laboratory tests including a pregnancy test, blood tests, and urine test.  °· Ordering cultures of the vagina and cervix to check for a sexually transmitted infection (STI). °· Performing an ultrasound.    °· Performing a laparoscopic procedure to look inside the pelvis.   °TREATMENT  °· Antibiotic medicines may be prescribed and taken by mouth.   °· Sexual partners may be treated when the infection is caused by a sexually transmitted disease (STD).   °· Hospitalization may be needed to give antibiotics intravenously. °· Surgery may be needed, but this is rare. °It may take weeks until you are completely well. If you are diagnosed with PID, you should also be checked for human immunodeficiency virus (HIV).   °HOME CARE INSTRUCTIONS  °· If given, take your antibiotics as directed. Finish the medicine even if you start to feel better.   °· Only take over-the-counter or prescription medicines for pain, discomfort, or fever as directed by your caregiver.   °· Do not have sexual intercourse until treatment is completed or as directed by your caregiver. If PID is confirmed, your recent sexual partner(s) will need treatment.   °· Keep your follow-up appointments. °SEEK MEDICAL CARE IF:  °· You have increased or abnormal vaginal discharge.   °· You need prescription medicine for your pain.   °· You vomit.   °· You cannot take your medicines.   °· Your partner has an STD.   °SEEK IMMEDIATE MEDICAL CARE IF:  °· You have a fever.   °· You have increased abdominal or pelvic pain.   °· You have chills.   °· You have pain when you urinate.   °· You are not better after 72 hours following treatment.   °MAKE SURE YOU:  °· Understand these instructions. °· Will watch your condition. °· Will get help right away if you are not doing well or get worse. °  Document Released: 08/25/2005 Document Revised: 12/20/2012 Document Reviewed: 08/21/2011 °Exit[employer]® Patient Information ©2015 [employer]. This information is not intended to replace advice given to you by your health care provider. Make sure you discuss any questions you have with your health care provider. ° °

## 2015-05-15 ENCOUNTER — Encounter: Payer: Self-pay | Admitting: Obstetrics & Gynecology

## 2015-05-25 ENCOUNTER — Ambulatory Visit: Payer: 59

## 2015-05-25 NOTE — Progress Notes (Signed)
Subjective:      Review of Systems:   Review of Systems Objective:      Physical Exam  Exam  FHT:   Uterine Size:   Presentation:      Assessment:    Pregnancy:  No obstetric history on file.    Plan:

## 2015-05-25 NOTE — Progress Notes (Deleted)
Patient no show for scheduled Flu shot today.

## 2015-06-08 ENCOUNTER — Ambulatory Visit: Payer: Self-pay | Admitting: Obstetrics & Gynecology

## 2015-09-11 LAB — HM PAP SMEAR

## 2015-09-14 MED FILL — DOXYCYCLINE HYCLATE 100 MG: 100 | 3 days supply | Qty: 6 | Fill #0

## 2015-09-14 MED FILL — PROGESTERONE 200 MG CAPSULE: 200 | 6 days supply | Qty: 12 | Fill #0

## 2015-09-25 ENCOUNTER — Other Ambulatory Visit (HOSPITAL_COMMUNITY): Payer: Self-pay | Admitting: Obstetrics and Gynecology

## 2015-09-25 DIAGNOSIS — N979 Female infertility, unspecified: Secondary | ICD-10-CM

## 2015-10-01 ENCOUNTER — Ambulatory Visit (HOSPITAL_COMMUNITY)
Admission: RE | Admit: 2015-10-01 | Discharge: 2015-10-01 | Disposition: A | Discharge status: Home / Self Care | Payer: 59 | Source: Ambulatory Visit | Attending: Obstetrics and Gynecology | Admitting: Obstetrics and Gynecology

## 2015-10-01 DIAGNOSIS — N7011 Chronic salpingitis: Secondary | ICD-10-CM | POA: Diagnosis not present

## 2015-10-01 DIAGNOSIS — N979 Female infertility, unspecified: Secondary | ICD-10-CM | POA: Diagnosis present

## 2015-10-01 MED ORDER — IOHEXOL 300 MG/ML  SOLN
30.0000 mL | Freq: Once | INTRAMUSCULAR | Status: AC | PRN
  Administered 2015-10-01: 12 mL

## 2015-10-02 ENCOUNTER — Encounter: Payer: Self-pay | Admitting: General Practice

## 2015-10-09 ENCOUNTER — Encounter (HOSPITAL_COMMUNITY): Payer: Self-pay | Admitting: Emergency Medicine

## 2015-10-09 ENCOUNTER — Emergency Department (HOSPITAL_COMMUNITY)
Admission: EM | Admit: 2015-10-09 | Discharge: 2015-10-10 | Disposition: A | Discharge status: Home / Self Care | Payer: 59 | Attending: Emergency Medicine | Admitting: Emergency Medicine

## 2015-10-09 DIAGNOSIS — L02219 Cutaneous abscess of trunk, unspecified: Secondary | ICD-10-CM

## 2015-10-09 DIAGNOSIS — Z792 Long term (current) use of antibiotics: Secondary | ICD-10-CM | POA: Insufficient documentation

## 2015-10-09 DIAGNOSIS — Z8742 Personal history of other diseases of the female genital tract: Secondary | ICD-10-CM | POA: Diagnosis not present

## 2015-10-09 DIAGNOSIS — Z87891 Personal history of nicotine dependence: Secondary | ICD-10-CM | POA: Diagnosis not present

## 2015-10-09 DIAGNOSIS — L03319 Cellulitis of trunk, unspecified: Secondary | ICD-10-CM

## 2015-10-09 DIAGNOSIS — Z880 Allergy status to penicillin: Secondary | ICD-10-CM | POA: Diagnosis not present

## 2015-10-09 DIAGNOSIS — L02211 Cutaneous abscess of abdominal wall: Secondary | ICD-10-CM | POA: Diagnosis present

## 2015-10-09 DIAGNOSIS — M419 Scoliosis, unspecified: Secondary | ICD-10-CM | POA: Diagnosis not present

## 2015-10-09 DIAGNOSIS — Z8639 Personal history of other endocrine, nutritional and metabolic disease: Secondary | ICD-10-CM | POA: Diagnosis not present

## 2015-10-09 DIAGNOSIS — L03311 Cellulitis of abdominal wall: Secondary | ICD-10-CM | POA: Insufficient documentation

## 2015-10-09 NOTE — ED Notes (Signed)
Patient reports abscess to RLQ abdomen x3 days. History of same. Denies drainage, fever/chills, N/V. Rates pain 8/10.

## 2015-10-10 MED ORDER — LIDOCAINE-EPINEPHRINE 2 %-1:100000 IJ SOLN
INTRAMUSCULAR | Status: AC
Start: 2015-10-10 — End: 2015-10-10
  Administered 2015-10-10: 1 mL
  Filled 2015-10-10: qty 1

## 2015-10-10 MED ORDER — SULFAMETHOXAZOLE-TRIMETHOPRIM 800-160 MG PO TABS
1.0000 | ORAL_TABLET | Freq: Once | ORAL | Status: AC
  Administered 2015-10-10: 1 via ORAL
  Filled 2015-10-10: qty 1

## 2015-10-10 MED ORDER — LIDOCAINE-EPINEPHRINE (PF) 2 %-1:200000 IJ SOLN: 20.0000 mL | Freq: Once | INTRAMUSCULAR | Status: DC

## 2015-10-10 MED ORDER — SULFAMETHOXAZOLE-TRIMETHOPRIM 800-160 MG PO TABS: 1.0000 | ORAL_TABLET | Freq: Two times a day (BID) | ORAL | Status: AC

## 2015-10-10 NOTE — ED Provider Notes (Signed)
CSN: [id number]     Arrival date & time 10/09/15  2306 History  By signing my name below, I, Brittany Lindsey, attest that this documentation has been prepared under the direction and in the presence of Azalia Bilis, MD. Electronically Signed: Sonum Lindsey, Neurosurgeon. 10/10/2015. 12:52 AM.    Chief Complaint  Patient presents with  . Abscess    The history is provided by the patient. No language interpreter was used.     HPI Comments: Brittany Lindsey is a 25 y.o. female who presents to the Emergency Department complaining of 2 days of a gradual onset, constant area of pain, redness, and swelling to the suprapubic area. She reports similar symptoms to the same area in the past with prior abscesses. She rates her pain as 7-8/10 currently. She denies fever.    Past Medical History  Diagnosis Date  . PCOS (polycystic ovarian syndrome)   . Tubo-ovarian abscess Feb 2016  . Scoliosis    Past Surgical History  Procedure Laterality Date  . No past surgeries     Family History  Problem Relation Age of Onset  . Diabetes Mother   . Seizures Mother   . Hyperlipidemia Mother    Social History  Substance Use Topics  . Smoking status: Former Games developer  . Smokeless tobacco: Never Used  . Alcohol Use: No   OB History    No data available     Review of Systems  Constitutional: Negative for fever.  Skin: Positive for color change and wound (abscess ).  All other systems reviewed and are negative.   Allergies  Mushroom extract complex and Penicillins  Home Medications   Prior to Admission medications   Medication Sig Start Date End Date Taking? Authorizing Provider  ciprofloxacin (CIPRO) 500 MG tablet Take 1 tablet (500 mg total) by mouth 2 (two) times daily. Patient not taking: Reported on 05/11/2015 04/18/15   Antony Madura, PA-C  doxycycline (VIBRAMYCIN) 100 MG capsule Take 1 capsule (100 mg total) by mouth 2 (two) times daily. 05/11/15   Willodean Rosenthal, MD  HYDROcodone-acetaminophen  (NORCO/VICODIN) 5-325 MG per tablet Take 1-2 tablets by mouth every 6 (six) hours as needed for severe pain. Patient not taking: Reported on 05/11/2015 04/18/15   Antony Madura, PA-C  metFORMIN (GLUCOPHAGE) 500 MG tablet Take 1 tablet (500 mg total) by mouth 2 (two) times daily with a meal. Patient not taking: Reported on 04/22/2015 01/08/15   Sheliah Hatch, MD  metroNIDAZOLE (FLAGYL) 500 MG tablet Take 1 tablet (500 mg total) by mouth 2 (two) times daily. Patient not taking: Reported on 05/11/2015 04/18/15   Antony Madura, PA-C  Vitamin D, Ergocalciferol, (DRISDOL) 50000 UNITS CAPS capsule Take 1 capsule (50,000 Units total) by mouth every 7 (seven) days. Patient not taking: Reported on 04/18/2015 04/02/15   Sheliah Hatch, MD   BP 125/76 mmHg  Pulse 88  Temp(Src) 98 F (36.7 C) (Oral)  Resp 18  Ht  (1.6 m)  Wt 260 lb (117.935 kg)  BMI 46.07 kg/m2  SpO2 100%  LMP 10/01/2015 Physical Exam  Constitutional: She is oriented to person, place, and time. She appears well-developed and well-nourished.  HENT:  Head: Normocephalic.  Eyes: EOM are normal.  Neck: Normal range of motion.  Pulmonary/Chest: Effort normal.  Abdominal: She exhibits no distension.  Musculoskeletal: Normal range of motion.  Neurological: She is alert and oriented to person, place, and time.  Skin: There is erythema.  Suprapubic area has erythema with associated induration and  small fluctuance. No drainage noted.   Psychiatric: She has a normal mood and affect.  Nursing note and vitals reviewed.   ED Course  Procedures (including critical care time)  DIAGNOSTIC STUDIES: Oxygen Saturation is 100% on RA, normal by my interpretation.    COORDINATION OF CARE: 12:40 AM Will I&D the abscess. Discussed treatment plan with pt at bedside and pt agreed to plan.  INCISION AND DRAINAGE PROCEDURE NOTE: Patient identification was confirmed and verbal consent was obtained. This procedure was performed by Azalia Bilis, MD  at 1:07 AM. Site: suprapubic Sterile procedures observed Needle size: 24 Anesthetic used (type and amt): 2% lidocaine with epi, 5 cc Blade size: 11 Drainage: small Complexity: Complex Packing used: None used  Site anesthetized, incision made over site, wound drained and explored loculations, rinsed with copious amounts of normal saline, wound packed with sterile gauze, covered with dry, sterile dressing.  Pt tolerated procedure well without complications.  Instructions for care discussed verbally and pt provided with additional written instructions for homecare and f/u.    Labs Review Labs Reviewed - No data to display  Imaging Review No results found.    EKG Interpretation None      MDM   Final diagnoses:  None    Well-appearing.  Discharged home with antibiotics.  Tolerated incision and drainage without significant issues.  Patient understands to return to the ER for new or worsening symptoms  I personally performed the services described in this documentation, which was scribed in my presence. The recorded information has been reviewed and is accurate.       Azalia Bilis, MD 10/10/15 [phone]

## 2015-10-12 MED FILL — metFORMIN HCL 500 MG TABS: 500 | 30 days supply | Qty: 60 | Fill #0

## 2015-10-12 MED FILL — PROGESTERONE 200 MG CAPSULE: 200 | 30 days supply | Qty: 12 | Fill #0

## 2015-10-12 MED FILL — SULFAMETHOXAZOLE/TMP DS TAB: 800-160 | 7 days supply | Qty: 14 | Fill #0

## 2015-10-26 ENCOUNTER — Other Ambulatory Visit: Payer: Self-pay | Admitting: Obstetrics and Gynecology

## 2015-10-26 NOTE — Patient Instructions (Signed)
Your procedure is scheduled on:  Friday, Feb. 24, 2017  Enter through the [employer] of [facility] at: 10:00 A.M.  Pick up the phone at the desk and dial [phone].  Call this number if you have problems the morning of surgery: [phone].  Remember:  Do NOT eat food or drink after: Midnight Thursday  Take these medicines the morning of surgery with a SIP OF WATER:  None  *DO NOT TAKE METFORMIN 24 HOURS PRIOR TO SURGERY  Do NOT wear jewelry (body piercing), metal hair clips/bobby pins, make-up, or nail polish. Do NOT wear lotions, powders, or perfumes.  You may wear deoderant. Do NOT shave for 48 hours prior to surgery. Do NOT bring valuables to the hospital. Contacts, dentures, or bridgework may not be worn into surgery.  Have a responsible adult drive you home and stay with you for 24 hours after your procedure

## 2015-10-29 ENCOUNTER — Other Ambulatory Visit (HOSPITAL_COMMUNITY): Payer: Self-pay | Admitting: Obstetrics and Gynecology

## 2015-10-29 ENCOUNTER — Encounter (HOSPITAL_COMMUNITY)
Admission: RE | Admit: 2015-10-29 | Discharge: 2015-10-29 | Disposition: A | Discharge status: Home / Self Care | Payer: 59 | Source: Ambulatory Visit | Attending: Obstetrics and Gynecology | Admitting: Obstetrics and Gynecology

## 2015-10-29 ENCOUNTER — Encounter (HOSPITAL_COMMUNITY): Payer: Self-pay

## 2015-10-29 DIAGNOSIS — Z01812 Encounter for preprocedural laboratory examination: Secondary | ICD-10-CM | POA: Diagnosis present

## 2015-10-29 LAB — CBC
HEMATOCRIT: 40.2 % (ref 36.0–46.0)
HEMOGLOBIN: 13.2 g/dL (ref 12.0–15.0)
MCH: 27.7 pg (ref 26.0–34.0)
MCHC: 32.8 g/dL (ref 30.0–36.0)
MCV: 84.3 fL (ref 78.0–100.0)
Platelets: 237 10*3/uL (ref 150–400)
RBC: 4.77 MIL/uL (ref 3.87–5.11)
RDW: 12.6 % (ref 11.5–15.5)
WBC: 4.9 10*3/uL (ref 4.0–10.5)

## 2015-10-29 LAB — BASIC METABOLIC PANEL
Anion gap: 6 (ref 5–15)
BUN: 12 mg/dL (ref 6–20)
CHLORIDE: 104 mmol/L (ref 101–111)
CO2: 27 mmol/L (ref 22–32)
Calcium: 8.8 mg/dL — ABNORMAL LOW (ref 8.9–10.3)
Creatinine, Ser: 0.86 mg/dL (ref 0.44–1.00)
GFR calc Af Amer: >60 mL/min (ref >60)
GFR calc non Af Amer: >60 mL/min (ref >60)
Glucose, Bld: 99 mg/dL (ref 65–99)
POTASSIUM: 3.9 mmol/L (ref 3.5–5.1)
Sodium: 137 mmol/L (ref 135–145)

## 2015-10-29 NOTE — H&P (Signed)
Brittany Lindsey is a 25 y.o. female  G0 presents for laparoscopy because of recurrent pelvic pain and a right hydrosalpinx.  In February of 2016 the patient was admitted to [facility] because of  severe pelvic pain and was found to have a right hydrosalpinx.  Gonorrhea and Chlamydia cultures were negative  but the patient's symptoms responded to antibiotics.  She remained asymptomatic following that admission until August 2016 when her symptoms returned, though less severe.  Since that time she has had weekly fleeting pelvc pain, primarily on the right that is sharp and without any triggers.  She denies any dyspareunia or changes in bowel or bladder function.  Her menstural cycle  is irregular due a history of polycystic ovarian syndrome.  Her actual menstrual flow will last for 7 days and require her to change her pad 9 times a day.  During her menses, however,  her cramping is minimal.  A Hysterosalpingogram in January 2017 showed a moderate right hydrosalpinx with distal tube dilatation and no patency and a patent left tube.    A  pelvic ultrasound in February 2017 :  uterus: 4.95 x 4.78 x 3.11 cm;  right ovary: 6.37 x 2.89 x 2.30 cm with #2 simple cysts: 2.0 x 1.7 x 2.2 cm and 2.6 x 2.2 x 2.2 cm and left ovary: 5.26 x 2.50 x 2.30 cm and a simple cyst: 2.2 x 1.5 x 1.6 cm.  Given the persistent nature of her pelvic pain and findings on imaging,  the patient has decided to proceed with surgical evaluation and management of her pelvic pain and hydrosalpinx resolution.   Past Medical History  OB History: G:0  GYN History: menarche: 25 YO    LMP: 09/26/2015    Contracepton abstinence  The patient denies history of sexually transmitted disease.  Denies history of abnormal PAP smear.   Last PAP smear: 2017-normal  Medical History: Polycystic Ovarian Syndrome  Surgical History: Negative Denies problems with anesthesia or history of blood transfusions  Family History: Hypertension,  Thyroid Disease, Seizures and Renal Failure  Social History: Single employed with [employer];  Denies tobacco and alcohol use   Medications:  Metformin 500 mg  bid Progesterone 200 mg daily x 12 days as directed  Allergies  Allergen Reactions  . Mushroom Extract Complex Hives  . Penicillins Hives    Has patient had a PCN reaction causing immediate rash, facial/tongue/throat swelling, SOB or lightheadedness with hypotension: No Has patient had a PCN reaction causing severe rash involving mucus membranes or skin necrosis: No-- pt had hives Has patient had a PCN reaction that required hospitalization: No Has patient had a PCN reaction occurring within the last 10 years: No If all of the above answers are "NO", then may proceed with Cephalosporin use.     Denies sensitivity to peanuts, shellfish, soy, latex or adhesives.  ROS: Admits to occasional palpitaions and extremity cramping but  denies headache, corrective lenses,  vision changes, nasal congestion, dysphagia, tinnitus, dizziness, hoarseness, cough,  chest pain, shortness of breath, nausea, vomiting, diarrhea,constipation,  urinary frequency, urgency  dysuria, hematuria, vaginitis symptoms, pelvic pain, swelling of joints,easy bruising,  myalgias, arthralgias, skin rashes, unexplained weight loss and except as is mentioned in the history of present illness, patient's review of systems is otherwise negative.     Physical Exam  Bp: 102/60   P: 84    R: 20  Temperature: 98.4 degrees F orally  Weight: 257 lbs.  Height: 5' 4.5 "  BMI: 43.4  Neck: supple without masses or thyromegaly Lungs: clear to auscultation Heart: regular rate and rhythm Abdomen: soft, right lower quadrant tenderness without guarding and no organomegaly Pelvic:EGBUS- wnl; vagina-normal rugae; uterus-normal size, cervix without lesions or motion tenderness; adnexae-bilateral tenderness but right > left and  no masses Extremities:  no clubbing, cyanosis or  edema   Assesment: Chronic Pelvic Pain           Right Hydrosalpinx           Bilateral Ovarian Cysts  Disposition:  A discussion was held with patient regarding the indication for her procedure(s) along with the risks, which include but are not limited to: reaction to anesthesia, damage to adjacent organs, infection and excessive bleeding. The patient verbalized understanding of these risks and has consented to proceed with Laparoscopic Cure of Hydrosalpinx, Possible Salpingectomy and Chromopertubation at [facility] on November 02, 2015.   CSN# [id number]   Wileen Duncanson J. Lowell Guitar, PA-C  for Dr. Crist Fat. Rivard

## 2015-11-01 MED ORDER — GENTAMICIN SULFATE 40 MG/ML IJ SOLN
INTRAVENOUS | Status: AC
  Administered 2015-11-02: 115 mL via INTRAVENOUS
  Filled 2015-11-01: qty 9.75

## 2015-11-02 ENCOUNTER — Ambulatory Visit (HOSPITAL_COMMUNITY)
Admission: RE | Admit: 2015-11-02 | Discharge: 2015-11-02 | Disposition: A | Discharge status: Home / Self Care | Payer: 59 | Source: Ambulatory Visit | Attending: Obstetrics and Gynecology | Admitting: Obstetrics and Gynecology

## 2015-11-02 ENCOUNTER — Ambulatory Visit (HOSPITAL_COMMUNITY): Payer: 59 | Admitting: Anesthesiology

## 2015-11-02 ENCOUNTER — Encounter (HOSPITAL_COMMUNITY)
Admission: RE | Disposition: A | Discharge status: Home / Self Care | Payer: Self-pay | Source: Ambulatory Visit | Attending: Obstetrics and Gynecology

## 2015-11-02 ENCOUNTER — Encounter (HOSPITAL_COMMUNITY): Payer: Self-pay | Admitting: Emergency Medicine

## 2015-11-02 DIAGNOSIS — N83202 Unspecified ovarian cyst, left side: Secondary | ICD-10-CM | POA: Diagnosis not present

## 2015-11-02 DIAGNOSIS — Z87891 Personal history of nicotine dependence: Secondary | ICD-10-CM | POA: Diagnosis not present

## 2015-11-02 DIAGNOSIS — G8929 Other chronic pain: Secondary | ICD-10-CM | POA: Diagnosis not present

## 2015-11-02 DIAGNOSIS — N7011 Chronic salpingitis: Secondary | ICD-10-CM | POA: Insufficient documentation

## 2015-11-02 DIAGNOSIS — Z6841 Body Mass Index (BMI) 40.0 and over, adult: Secondary | ICD-10-CM | POA: Insufficient documentation

## 2015-11-02 DIAGNOSIS — N736 Female pelvic peritoneal adhesions (postinfective): Secondary | ICD-10-CM | POA: Diagnosis not present

## 2015-11-02 DIAGNOSIS — R102 Pelvic and perineal pain: Secondary | ICD-10-CM | POA: Insufficient documentation

## 2015-11-02 DIAGNOSIS — N83201 Unspecified ovarian cyst, right side: Secondary | ICD-10-CM | POA: Insufficient documentation

## 2015-11-02 HISTORY — PX: CHROMOPERTUBATION: SHX6288

## 2015-11-02 HISTORY — PX: LYSIS OF ADHESION: SHX5961

## 2015-11-02 HISTORY — PX: LAPAROSCOPY: SHX197

## 2015-11-02 LAB — PREGNANCY, URINE: PREG TEST UR: NEGATIVE

## 2015-11-02 SURGERY — LAPAROSCOPY OPERATIVE
Anesthesia: General | Site: Vagina | Laterality: Right

## 2015-11-02 MED ORDER — FENTANYL CITRATE (PF) 100 MCG/2ML IJ SOLN
INTRAMUSCULAR | Status: AC
  Filled 2015-11-02: qty 2

## 2015-11-02 MED ORDER — SCOPOLAMINE 1 MG/3DAYS TD PT72
MEDICATED_PATCH | TRANSDERMAL | Status: AC
  Administered 2015-11-02: 1.5 mg via TRANSDERMAL
  Filled 2015-11-02: qty 1

## 2015-11-02 MED ORDER — ONDANSETRON HCL 4 MG/2ML IJ SOLN
INTRAMUSCULAR | Status: DC | PRN
  Administered 2015-11-02: 4 mg via INTRAVENOUS

## 2015-11-02 MED ORDER — FENTANYL CITRATE (PF) 100 MCG/2ML IJ SOLN: 25.0000 ug | INTRAMUSCULAR | Status: DC | PRN

## 2015-11-02 MED ORDER — OXYCODONE-ACETAMINOPHEN 5-325 MG PO TABS: 1.0000 | ORAL_TABLET | ORAL | Status: DC | PRN

## 2015-11-02 MED ORDER — KETOROLAC TROMETHAMINE 30 MG/ML IJ SOLN
INTRAMUSCULAR | Status: DC | PRN
  Administered 2015-11-02: 30 mg via INTRAVENOUS

## 2015-11-02 MED ORDER — SUCCINYLCHOLINE CHLORIDE 20 MG/ML IJ SOLN
INTRAMUSCULAR | Status: DC | PRN
  Administered 2015-11-02: 140 mg via INTRAVENOUS

## 2015-11-02 MED ORDER — MIDAZOLAM HCL 2 MG/2ML IJ SOLN
INTRAMUSCULAR | Status: AC
  Filled 2015-11-02: qty 2

## 2015-11-02 MED ORDER — ONDANSETRON HCL 4 MG/2ML IJ SOLN: 4.0000 mg | Freq: Once | INTRAMUSCULAR | Status: DC | PRN

## 2015-11-02 MED ORDER — VASOPRESSIN 20 UNIT/ML IV SOLN
INTRAVENOUS | Status: AC
  Filled 2015-11-02: qty 1

## 2015-11-02 MED ORDER — DEXAMETHASONE SODIUM PHOSPHATE 10 MG/ML IJ SOLN
INTRAMUSCULAR | Status: DC | PRN
Start: 2015-11-02 — End: 2015-11-02
  Administered 2015-11-02: 4 mg via INTRAVENOUS

## 2015-11-02 MED ORDER — LIDOCAINE HCL (CARDIAC) 20 MG/ML IV SOLN
INTRAVENOUS | Status: AC
  Filled 2015-11-02: qty 5

## 2015-11-02 MED ORDER — DEXAMETHASONE SODIUM PHOSPHATE 4 MG/ML IJ SOLN
INTRAMUSCULAR | Status: AC
  Filled 2015-11-02: qty 1

## 2015-11-02 MED ORDER — IBUPROFEN 600 MG PO TABS: 600.0000 mg | ORAL_TABLET | Freq: Four times a day (QID) | ORAL | Status: DC | PRN

## 2015-11-02 MED ORDER — METHYLENE BLUE 0.5 % INJ SOLN
INTRAVENOUS | Status: DC | PRN
  Administered 2015-11-02: 1 mL

## 2015-11-02 MED ORDER — BUPIVACAINE HCL (PF) 0.25 % IJ SOLN
INTRAMUSCULAR | Status: DC | PRN
  Administered 2015-11-02: 20 mL
  Administered 2015-11-02: 10 mL

## 2015-11-02 MED ORDER — KETOROLAC TROMETHAMINE 30 MG/ML IJ SOLN
INTRAMUSCULAR | Status: AC
Start: 2015-11-02 — End: 2015-11-02
  Filled 2015-11-02: qty 1

## 2015-11-02 MED ORDER — SODIUM CHLORIDE 0.9 % IJ SOLN
INTRAMUSCULAR | Status: AC
  Filled 2015-11-02: qty 100

## 2015-11-02 MED ORDER — LIDOCAINE HCL (CARDIAC) 20 MG/ML IV SOLN
INTRAVENOUS | Status: DC | PRN
  Administered 2015-11-02: 60 mg via INTRAVENOUS

## 2015-11-02 MED ORDER — ROCURONIUM BROMIDE 100 MG/10ML IV SOLN
INTRAVENOUS | Status: DC | PRN
  Administered 2015-11-02 (×2): 10 mg via INTRAVENOUS
  Administered 2015-11-02: 40 mg via INTRAVENOUS
  Administered 2015-11-02: 10 mg via INTRAVENOUS

## 2015-11-02 MED ORDER — LACTATED RINGERS IR SOLN
Status: DC | PRN
  Administered 2015-11-02: 3000 mL

## 2015-11-02 MED ORDER — ROCURONIUM BROMIDE 100 MG/10ML IV SOLN
INTRAVENOUS | Status: AC
  Filled 2015-11-02: qty 1

## 2015-11-02 MED ORDER — SUCCINYLCHOLINE CHLORIDE 20 MG/ML IJ SOLN
INTRAMUSCULAR | Status: AC
  Filled 2015-11-02: qty 1

## 2015-11-02 MED ORDER — NEOSTIGMINE METHYLSULFATE 10 MG/10ML IV SOLN
INTRAVENOUS | Status: DC | PRN
  Administered 2015-11-02: 5 mg via INTRAVENOUS

## 2015-11-02 MED ORDER — MIDAZOLAM HCL 2 MG/2ML IJ SOLN
INTRAMUSCULAR | Status: DC | PRN
  Administered 2015-11-02: 2 mg via INTRAVENOUS

## 2015-11-02 MED ORDER — BUPIVACAINE HCL (PF) 0.25 % IJ SOLN
INTRAMUSCULAR | Status: AC
  Filled 2015-11-02: qty 30

## 2015-11-02 MED ORDER — PROPOFOL 10 MG/ML IV BOLUS
INTRAVENOUS | Status: DC | PRN
  Administered 2015-11-02: 50 mg via INTRAVENOUS
  Administered 2015-11-02: 250 mg via INTRAVENOUS
  Administered 2015-11-02: 50 mg via INTRAVENOUS

## 2015-11-02 MED ORDER — ONDANSETRON HCL 4 MG/2ML IJ SOLN
INTRAMUSCULAR | Status: AC
  Filled 2015-11-02: qty 2

## 2015-11-02 MED ORDER — PROPOFOL 10 MG/ML IV BOLUS
INTRAVENOUS | Status: AC
  Filled 2015-11-02: qty 40

## 2015-11-02 MED ORDER — LACTATED RINGERS IV SOLN
INTRAVENOUS | Status: DC
  Administered 2015-11-02 (×2): via INTRAVENOUS

## 2015-11-02 MED ORDER — FENTANYL CITRATE (PF) 100 MCG/2ML IJ SOLN
INTRAMUSCULAR | Status: DC | PRN
  Administered 2015-11-02 (×4): 50 ug via INTRAVENOUS
  Administered 2015-11-02: 100 ug via INTRAVENOUS
  Administered 2015-11-02: 50 ug via INTRAVENOUS

## 2015-11-02 MED ORDER — GLYCOPYRROLATE 0.2 MG/ML IJ SOLN
INTRAMUSCULAR | Status: DC | PRN
  Administered 2015-11-02: .8 mg via INTRAVENOUS

## 2015-11-02 MED ORDER — METHYLENE BLUE 1 % INJ SOLN
INTRAMUSCULAR | Status: AC
Start: 2015-11-02 — End: 2015-11-02
  Filled 2015-11-02: qty 1

## 2015-11-02 MED ORDER — SCOPOLAMINE 1 MG/3DAYS TD PT72
1.0000 | MEDICATED_PATCH | Freq: Once | TRANSDERMAL | Status: DC
  Administered 2015-11-02: 1.5 mg via TRANSDERMAL

## 2015-11-02 MED ORDER — FENTANYL CITRATE (PF) 250 MCG/5ML IJ SOLN
INTRAMUSCULAR | Status: AC
  Filled 2015-11-02: qty 5

## 2015-11-02 SURGICAL SUPPLY — 34 items
APPLICATOR COTTON TIP 6IN STRL (MISCELLANEOUS) ×4 IMPLANT
BAG SPEC RTRVL LRG 6X4 10 (ENDOMECHANICALS) ×3
BARRIER ADHS 3X4 INTERCEED (GAUZE/BANDAGES/DRESSINGS) IMPLANT
BRR ADH 4X3 ABS CNTRL BYND (GAUZE/BANDAGES/DRESSINGS)
CABLE HIGH FREQUENCY MONO STRZ (ELECTRODE) ×8 IMPLANT
CHLORAPREP W/TINT 26ML (MISCELLANEOUS) IMPLANT
CLOTH BEACON ORANGE TIMEOUT ST (SAFETY) ×4 IMPLANT
DISSECTOR BLUNT TIP ENDO 5MM (MISCELLANEOUS) ×4 IMPLANT
DRSG COVADERM PLUS 2X2 (GAUZE/BANDAGES/DRESSINGS) IMPLANT
DRSG OPSITE POSTOP 3X4 (GAUZE/BANDAGES/DRESSINGS) IMPLANT
DURAPREP 26ML APPLICATOR (WOUND CARE) ×4 IMPLANT
FORCEPS CUTTING 33CM 5MM (CUTTING FORCEPS) ×4 IMPLANT
FORCEPS CUTTING 45CM 5MM (CUTTING FORCEPS) IMPLANT
GLOVE BIOGEL PI IND STRL 7.0 (GLOVE) ×6 IMPLANT
GLOVE BIOGEL PI INDICATOR 7.0 (GLOVE) ×2
GLOVE ECLIPSE 6.5 STRL STRAW (GLOVE) ×4 IMPLANT
GOWN STRL REUS W/TWL LRG LVL3 (GOWN DISPOSABLE) ×8 IMPLANT
LIQUID BAND (GAUZE/BANDAGES/DRESSINGS) ×4 IMPLANT
NEEDLE SPNL 22GX7 QUINCKE BK (NEEDLE) IMPLANT
PACK LAPAROSCOPY BASIN (CUSTOM PROCEDURE TRAY) ×4 IMPLANT
PAD TRENDELENBURG POSITION (MISCELLANEOUS) ×4 IMPLANT
PENCIL BUTTON HOLSTER BLD 10FT (ELECTRODE) ×4 IMPLANT
POUCH SPECIMEN RETRIEVAL 10MM (ENDOMECHANICALS) ×4 IMPLANT
SCISSORS LAP 5X35 DISP (ENDOMECHANICALS) ×8 IMPLANT
SET IRRIG TUBING LAPAROSCOPIC (IRRIGATION / IRRIGATOR) IMPLANT
SLEEVE XCEL OPT CAN 5 100 (ENDOMECHANICALS) ×4 IMPLANT
SOLUTION ELECTROLUBE (MISCELLANEOUS) IMPLANT
SUT MNCRL AB 3-0 PS2 27 (SUTURE) ×4 IMPLANT
SUT VICRYL 0 UR6 27IN ABS (SUTURE) ×8 IMPLANT
TOWEL OR 17X24 6PK STRL BLUE (TOWEL DISPOSABLE) ×8 IMPLANT
TRAY FOLEY CATH SILVER 14FR (SET/KITS/TRAYS/PACK) ×4 IMPLANT
TROCAR BALLN 12MMX100 BLUNT (TROCAR) ×4 IMPLANT
TROCAR XCEL NON-BLD 5MMX100MML (ENDOMECHANICALS) ×4 IMPLANT
WATER STERILE IRR 1000ML POUR (IV SOLUTION) ×4 IMPLANT

## 2015-11-02 NOTE — Discharge Instructions (Signed)
POST-OPERATIVE INSTRUCTIONS TO PATIENT  Call [facility]  [phone])  for excessive pain, bleeding or temperature greater than or equal to 100.4 degrees (orally).    No driving for 1 day No lifting (more than 20 lbs) for 2 weeks No sexual activity for 2 weeks  Pain management:  Use Ibuprofen 600 mg every 6 hours for 5 days and then as needed. Use your pain medication as needed to maintain a pain level at or below 3/10 Use Colace 1-2 capsules per day as long as you are using pain  medication to avoid constipation.       Diet: normal  Bathing: may shower day after surgery  Wound Care: keep incisions clean and dry  Return to Dr. Estanislado Pandy as scheduled  Return to work: To be determined at post operative visit.    RIVARD,SANDRA A MD 11/02/2015 11:12 AM DISCHARGE INSTRUCTIONS: Laparoscopy  The following instructions have been prepared to help you care for yourself upon your return home today.  Wound care:  Do not get the incision wet for the first 24 hours. The incision should be kept clean and dry.  The Band-Aids or dressings may be removed the day after surgery.  Should the incision become sore, red, and swollen after the first week, check with your doctor.  Personal hygiene:  Shower the day after your procedure.  Activity and limitations:  Do NOT drive or operate any equipment today.  Do NOT lift anything more than 15 pounds for 2-3 weeks after surgery.  Do NOT rest in bed all day.  Walking is encouraged. Walk each day, starting slowly with 5-minute walks 3 or 4 times a day. Slowly increase the length of your walks.  Walk up and down stairs slowly.  Do NOT do strenuous activities, such as golfing, playing tennis, bowling, running, biking, weight lifting, gardening, mowing, or vacuuming for 2-4 weeks. Ask your doctor when it is okay to start.  Diet: Eat a light meal as desired this evening. You may resume your usual diet tomorrow.  Return to work:  This is dependent on the type of work you do. For the most part you can return to a desk job within a week of surgery. If you are more active at work, please discuss this with your doctor.  What to expect after your surgery: You may have a slight burning sensation when you urinate on the first day. You may have a very small amount of blood in the urine. Expect to have a small amount of vaginal discharge/light bleeding for 1-2 weeks. It is not unusual to have abdominal soreness and bruising for up to 2 weeks. You may be tired and need more rest for about 1 week. You may experience shoulder pain for 24-72 hours. Lying flat in bed may relieve it.  Call your doctor for any of the following:  Develop a fever of 100.4 or greater  Inability to urinate 6 hours after discharge from hospital  Severe pain not relieved by pain medications  Persistent of heavy bleeding at incision site  Redness or swelling around incision site after a week  Increasing nausea or vomiting  Patient Signature________________________________________ Nurse Signature_________________________________________ Post Anesthesia Home Care Instructions  Activity: Get plenty of rest for the remainder of the day. A responsible adult should stay with you for 24 hours following the procedure.  For the next 24 hours, DO NOT: -Drive a car -Advertising copywriter -Drink alcoholic beverages -Take any medication unless instructed by your physician -Make any  legal decisions or sign important papers.  Meals: Start with liquid foods such as gelatin or soup. Progress to regular foods as tolerated. Avoid greasy, spicy, heavy foods. If nausea and/or vomiting occur, drink only clear liquids until the nausea and/or vomiting subsides. Call your physician if vomiting continues.  Special Instructions/Symptoms: Your throat may feel dry or sore from the anesthesia or the breathing tube placed in your throat during surgery. If this causes discomfort,  gargle with warm salt water. The discomfort should disappear within 24 hours.  If you had a scopolamine patch placed behind your ear for the management of post- operative nausea and/or vomiting:  1. The medication in the patch is effective for 72 hours, after which it should be removed.  Wrap patch in a tissue and discard in the trash. Wash hands thoroughly with soap and water. 2. You may remove the patch earlier than 72 hours if you experience unpleasant side effects which may include dry mouth, dizziness or visual disturbances. 3. Avoid touching the patch. Wash your hands with soap and water after contact with the patch.

## 2015-11-02 NOTE — Transfer of Care (Signed)
Immediate Anesthesia Transfer of Care Note  Patient: Katelyne Galster  Procedure(s) Performed: Procedure(s): LAPAROSCOPY OPERATIVE with Cure of Hydrosalpynx  with Right Salpingectomy (Right) CHROMOPERTUBATION (N/A) EXTENSIVE LYSIS OF ADHESION (N/A)  Patient Location: PACU  Anesthesia Type:General  Level of Consciousness: awake, alert  and oriented  Airway & Oxygen Therapy: Patient Spontanous Breathing and Patient connected to nasal cannula oxygen  Post-op Assessment: Report given to RN and Post -op Vital signs reviewed and stable  Post vital signs: Reviewed and stable  Last Vitals:  Filed Vitals:   11/02/15 1002  BP: 131/79  Pulse: 84  Temp: 36.9 C  Resp: 16    Complications: No apparent anesthesia complications

## 2015-11-02 NOTE — H&P (View-Only) (Signed)
Brittany Lindsey is a 25 y.o. female  G0 presents for laparoscopy because of recurrent pelvic pain and a right hydrosalpinx.  In February of 2016 the patient was admitted to [facility] because of  severe pelvic pain and was found to have a right hydrosalpinx.  Gonorrhea and Chlamydia cultures were negative  but the patient's symptoms responded to antibiotics.  She remained asymptomatic following that admission until August 2016 when her symptoms returned, though less severe.  Since that time she has had weekly fleeting pelvc pain, primarily on the right that is sharp and without any triggers.  She denies any dyspareunia or changes in bowel or bladder function.  Her menstural cycle  is irregular due a history of polycystic ovarian syndrome.  Her actual menstrual flow will last for 7 days and require her to change her pad 9 times a day.  During her menses, however,  her cramping is minimal.  A Hysterosalpingogram in January 2017 showed a moderate right hydrosalpinx with distal tube dilatation and no patency and a patent left tube.    A  pelvic ultrasound in February 2017 :  uterus: 4.95 x 4.78 x 3.11 cm;  right ovary: 6.37 x 2.89 x 2.30 cm with #2 simple cysts: 2.0 x 1.7 x 2.2 cm and 2.6 x 2.2 x 2.2 cm and left ovary: 5.26 x 2.50 x 2.30 cm and a simple cyst: 2.2 x 1.5 x 1.6 cm.  Given the persistent nature of her pelvic pain and findings on imaging,  the patient has decided to proceed with surgical evaluation and management of her pelvic pain and hydrosalpinx resolution.   Past Medical History  OB History: G:0  GYN History: menarche: 25 YO    LMP: 09/26/2015    Contracepton abstinence  The patient denies history of sexually transmitted disease.  Denies history of abnormal PAP smear.   Last PAP smear: 2017-normal  Medical History: Polycystic Ovarian Syndrome  Surgical History: Negative Denies problems with anesthesia or history of blood transfusions  Family History: Hypertension,  Thyroid Disease, Seizures and Renal Failure  Social History: Single employed with [employer];  Denies tobacco and alcohol use   Medications:  Metformin 500 mg  bid Progesterone 200 mg daily x 12 days as directed  Allergies  Allergen Reactions  . Mushroom Extract Complex Hives  . Penicillins Hives    Has patient had a PCN reaction causing immediate rash, facial/tongue/throat swelling, SOB or lightheadedness with hypotension: No Has patient had a PCN reaction causing severe rash involving mucus membranes or skin necrosis: No-- pt had hives Has patient had a PCN reaction that required hospitalization: No Has patient had a PCN reaction occurring within the last 10 years: No If all of the above answers are "NO", then may proceed with Cephalosporin use.     Denies sensitivity to peanuts, shellfish, soy, latex or adhesives.  ROS: Admits to occasional palpitaions and extremity cramping but  denies headache, corrective lenses,  vision changes, nasal congestion, dysphagia, tinnitus, dizziness, hoarseness, cough,  chest pain, shortness of breath, nausea, vomiting, diarrhea,constipation,  urinary frequency, urgency  dysuria, hematuria, vaginitis symptoms, pelvic pain, swelling of joints,easy bruising,  myalgias, arthralgias, skin rashes, unexplained weight loss and except as is mentioned in the history of present illness, patient's review of systems is otherwise negative.     Physical Exam  Bp: 102/60   P: 84    R: 20  Temperature: 98.4 degrees F orally  Weight: 257 lbs.  Height: 5' 4.5 "  BMI: 43.4  Neck: supple without masses or thyromegaly Lungs: clear to auscultation Heart: regular rate and rhythm Abdomen: soft, right lower quadrant tenderness without guarding and no organomegaly Pelvic:EGBUS- wnl; vagina-normal rugae; uterus-normal size, cervix without lesions or motion tenderness; adnexae-bilateral tenderness but right > left and  no masses Extremities:  no clubbing, cyanosis or  edema   Assesment: Chronic Pelvic Pain           Right Hydrosalpinx           Bilateral Ovarian Cysts  Disposition:  A discussion was held with patient regarding the indication for her procedure(s) along with the risks, which include but are not limited to: reaction to anesthesia, damage to adjacent organs, infection and excessive bleeding. The patient verbalized understanding of these risks and has consented to proceed with Laparoscopic Cure of Hydrosalpinx, Possible Salpingectomy and Chromopertubation at [facility] on November 02, 2015.   CSN# [id number]   Shaunette Gassner J. Byrdie Miyazaki, PA-C  for Dr. Sandra A. Rivard  

## 2015-11-02 NOTE — Anesthesia Procedure Notes (Signed)
Procedure Name: Intubation Date/Time: 11/02/2015 11:54 AM Performed by: Shanon Payor Pre-anesthesia Checklist: Patient identified, Emergency Drugs available, Suction available, Patient being monitored and Timeout performed Patient Re-evaluated:Patient Re-evaluated prior to inductionOxygen Delivery Method: Circle system utilized Preoxygenation: Pre-oxygenation with 100% oxygen Intubation Type: IV induction Ventilation: Mask ventilation without difficulty Laryngoscope Size: Mac and 3 Grade View: Grade I Tube type: Oral Tube size: 7.0 mm Number of attempts: 1 Airway Equipment and Method: Patient positioned with wedge pillow and Stylet Placement Confirmation: ETT inserted through vocal cords under direct vision,  positive ETCO2 and breath sounds checked- equal and bilateral Secured at: 20 cm Tube secured with: Tape Dental Injury: Teeth and Oropharynx as per pre-operative assessment

## 2015-11-02 NOTE — Op Note (Signed)
Preoperative diagnosis:pelvic pain with right hydrosalpynx  Postoperative diagnosis: severe pelvic adhesions, right hydrosalpynx, patent left tube  Anesthesia: Gen.  Anesthesiologist: Dr. Karie Schwalbe  Procedure: Diagnostic laparoscopy with extensive lysis of adhesions, right salpingectomy and chromopertubation  Surgeon: Dr. Dois Davenport Fleetwood Pierron  Assistant: Henreitta Leber PA-C  Estimated blood loss: Minimal  Procedure:  After being informed of the planned procedure with possible complications including bleeding, infection, injury to other organs,informed consent is obtained and the patient is taken to OR # 4. She is placed in lithotomy position, prepped and draped in a sterile fashion, and her bladder is emptied with an in and out red rubber catheter.  Pelvic exam: Normal external genitalia, normal cervix, normal uterus, normal adnexa  A speculum is inserted in the vagina and the anterior lip of the cervix was grasped with tenaculum forcep. An acorn intrauterine manipulator is easily positioned and the speculum is removed.  We infiltrate the umbilical area using 10 cc of Marcaine 0.25 and perform a semi-elliptical incision which is brought down bluntly to the fascia. The fascia is identified and grasped with Coker forceps. The fascia is opened with Mayo scissors. Peritoneum is entered bluntly. A pursestring suture of 0 Vicryl is placed on the fascia. A 10 mm Hassan trocar is easily inserted in the abdominal cavity and is held in place both by the intrauterine balloon and the previously placed pursestring suture. This allows for easy insufflation of the pneumoperitoneum using warm CO2 at a maximum pressure of 15 mmHg. A operative  is inserted in the abdominal cavity.  Observation: We note extensive adhesions involving the fundus of the uterus and the abdominal wall as well as obliterating the posterior cul-de-sac. We are unable to identify any other structures at this time. Appendix is normal and liver  is normal   We place 2   5 mm trocars under direct visualization in the LLQ and RLQ after infiltrating each site with 10 cc of Marcaine 0.25%.   Using hot scissors we proceed with systematic extensive lysis of adhesion until the anterior cul-de-sac is free as well as the posterior cul-de-sac. Most of the adhesions involved the right hydrosalpinx with the right ovary as well as the left tube and omentum. Once we have vision of the 2 tubes, we perform chromopertubation which confirms the hysterosalpingogram findings: Left tube is patent although it is impossible to see the end of the tube and a right tube fills completely better defining the hydrosalpinx.  Decision is made to proceed with right salpingectomy. Using the gyrus bipolar we systematically cauterize the mesosalpinx until we are able to excise the tube completely. This allows [place] to see the ovary which is adherent to the right pelvic wall. During dissection we were able to identify the right ureter which is away from our site of resecting. The tube is deposited in the posterior cul-de-sac. We continue to free the left tube with lysis of adhesion but it is impossible to free the fimbriae without risking injury to the tube.  The specimen is then deposited in an Endobag and removed from the abdominal cavity.  We irrigate the pelvis with warm saline and confirm hemostasis.  All instruments are then removed after evacuating the pneumoperitoneum. The fascia of the umbilical incision is closed with the purse string suture of 0 Vicryl. The skin of all 3 incisions is closed with a subcuticular suture of 3-0 Monocryl and Dermabond.  Instrument and sponge count is complete x2. Estimated blood loss is minimal.The procedure is well  tolerated by the patient is taken to recovery room in a well and stable condition.  Specimen: Right tube sent to pathology

## 2015-11-02 NOTE — Interval H&P Note (Signed)
History and Physical Interval Note:  11/02/2015 11:04 AM  Brittany Lindsey  has presented today for surgery, with the diagnosis of Pelvic Pain with Right Hydrosalpynx - 2 hours  The various methods of treatment have been discussed with the patient and family. After consideration of risks, benefits and other options for treatment, the patient has consented to  Procedure(s): LAPAROSCOPY OPERATIVE with Cure of Hydrosalpynx  with Possible Right Salpingectomy (Right) CHROMOPERTUBATION (N/A) as a surgical intervention .  The patient's history has been reviewed, patient examined, no change in status, stable for surgery.  I have reviewed the patient's chart and labs.  Questions were answered to the patient's satisfaction.     Cristela Stalder A

## 2015-11-02 NOTE — Anesthesia Preprocedure Evaluation (Signed)
Anesthesia Evaluation  Patient identified by MRN, date of birth, ID band Patient awake    Reviewed: Allergy & Precautions, NPO status , Patient's Chart, lab work & pertinent test results  History of Anesthesia Complications Negative for: history of anesthetic complications  Airway Mallampati: II  TM Distance: >3 FB Neck ROM: Full    Dental no notable dental hx. (+) Dental Advisory Given   Pulmonary former smoker,    Pulmonary exam normal breath sounds clear to auscultation       Cardiovascular negative cardio ROS Normal cardiovascular exam Rhythm:Regular Rate:Normal     Neuro/Psych negative neurological ROS  negative psych ROS   GI/Hepatic negative GI ROS, Neg liver ROS,   Endo/Other  Morbid obesity  Renal/GU negative Renal ROS  negative genitourinary   Musculoskeletal negative musculoskeletal ROS (+)   Abdominal   Peds negative pediatric ROS (+)  Hematology negative hematology ROS (+)   Anesthesia Other Findings   Reproductive/Obstetrics negative OB ROS                             Anesthesia Physical Anesthesia Plan  ASA: III  Anesthesia Plan: General   Post-op Pain Management:    Induction: Intravenous  Airway Management Planned: Oral ETT  Additional Equipment:   Intra-op Plan:   Post-operative Plan: Extubation in OR  Informed Consent: I have reviewed the patients History and Physical, chart, labs and discussed the procedure including the risks, benefits and alternatives for the proposed anesthesia with the patient or authorized representative who has indicated his/her understanding and acceptance.   Dental advisory given  Plan Discussed with: CRNA  Anesthesia Plan Comments:         Anesthesia Quick Evaluation

## 2015-11-02 NOTE — Interval H&P Note (Signed)
History and Physical Interval Note:  11/02/2015 11:33 AM  Brittany Lindsey  has presented today for surgery, with the diagnosis of Pelvic Pain with Right Hydrosalpynx - 2 hours  The various methods of treatment have been discussed with the patient and family. After consideration of risks, benefits and other options for treatment, the patient has consented to  Procedure(s): LAPAROSCOPY OPERATIVE with Cure of Hydrosalpynx  with Possible Right Salpingectomy (Right) CHROMOPERTUBATION (N/A) as a surgical intervention .  The patient's history has been reviewed, patient examined, no change in status, stable for surgery.  I have reviewed the patient's chart and labs.  Questions were answered to the patient's satisfaction.     Warren Lindahl A

## 2015-11-03 NOTE — Anesthesia Postprocedure Evaluation (Signed)
Anesthesia Post Note  Patient: Brittany Lindsey  Procedure(s) Performed: Procedure(s) (LRB): LAPAROSCOPY OPERATIVE with Cure of Hydrosalpynx  with Right Salpingectomy (Right) CHROMOPERTUBATION (N/A) EXTENSIVE LYSIS OF ADHESION (N/A)  Anesthesia Type: General Pain management: pain level controlled Vital Signs Assessment: post-procedure vital signs reviewed and stable Respiratory status: spontaneous breathing, nonlabored ventilation, respiratory function stable and patient connected to nasal cannula oxygen Cardiovascular status: blood pressure returned to baseline and stable Postop Assessment: no signs of nausea or vomiting Anesthetic complications: no    Last Vitals:  Filed Vitals:   11/02/15 1600 11/02/15 1630  BP: 119/77 116/81  Pulse: 65 67  Temp: 36.7 C 37 C  Resp: 15 16    Last Pain:  Filed Vitals:   11/02/15 1647  PainSc: 2                  [name]

## 2015-11-05 ENCOUNTER — Encounter (HOSPITAL_COMMUNITY): Payer: Self-pay | Admitting: Obstetrics and Gynecology

## 2015-12-18 MED FILL — LETROZOLE 2.5 MG TABLET: 2.5 | 30 days supply | Qty: 5 | Fill #0

## 2015-12-22 ENCOUNTER — Encounter: Payer: Self-pay | Admitting: Family Medicine

## 2016-01-04 MED FILL — TINIDAZOLE 500 MG TABLET: 500 | 2 days supply | Qty: 8 | Fill #0

## 2016-01-04 MED FILL — MEDROXYPROGESTERONE 10 MG T: 10 | 10 days supply | Qty: 10 | Fill #0

## 2016-02-19 MED FILL — MEDROXYPROGESTERONE 10 MG T: 10 | 10 days supply | Qty: 10 | Fill #0

## 2016-02-19 MED FILL — LETROZOLE 2.5 MG TABLET: 2.5 | 30 days supply | Qty: 10 | Fill #0

## 2016-02-20 ENCOUNTER — Encounter: Payer: Self-pay | Admitting: General Practice

## 2016-03-15 ENCOUNTER — Emergency Department (HOSPITAL_COMMUNITY)
Admission: EM | Admit: 2016-03-15 | Discharge: 2016-03-15 | Disposition: A | Discharge status: Home / Self Care | Payer: 59 | Attending: Emergency Medicine | Admitting: Emergency Medicine

## 2016-03-15 ENCOUNTER — Encounter (HOSPITAL_COMMUNITY): Payer: Self-pay

## 2016-03-15 DIAGNOSIS — S60562A Insect bite (nonvenomous) of left hand, initial encounter: Secondary | ICD-10-CM | POA: Diagnosis not present

## 2016-03-15 DIAGNOSIS — Z7984 Long term (current) use of oral hypoglycemic drugs: Secondary | ICD-10-CM | POA: Diagnosis not present

## 2016-03-15 DIAGNOSIS — W57XXXA Bitten or stung by nonvenomous insect and other nonvenomous arthropods, initial encounter: Secondary | ICD-10-CM | POA: Diagnosis not present

## 2016-03-15 DIAGNOSIS — Y939 Activity, unspecified: Secondary | ICD-10-CM | POA: Insufficient documentation

## 2016-03-15 DIAGNOSIS — Y929 Unspecified place or not applicable: Secondary | ICD-10-CM | POA: Insufficient documentation

## 2016-03-15 DIAGNOSIS — R21 Rash and other nonspecific skin eruption: Secondary | ICD-10-CM | POA: Diagnosis present

## 2016-03-15 DIAGNOSIS — Z87891 Personal history of nicotine dependence: Secondary | ICD-10-CM | POA: Insufficient documentation

## 2016-03-15 DIAGNOSIS — Y999 Unspecified external cause status: Secondary | ICD-10-CM | POA: Diagnosis not present

## 2016-03-15 MED ORDER — PREDNISONE 20 MG PO TABS: 40.0000 mg | ORAL_TABLET | Freq: Every day | ORAL | Status: DC

## 2016-03-15 MED ORDER — TRIAMCINOLONE ACETONIDE 0.025 % EX CREA: 1.0000 "application " | TOPICAL_CREAM | Freq: Two times a day (BID) | CUTANEOUS | Status: DC

## 2016-03-15 MED ORDER — DOXYCYCLINE HYCLATE 100 MG PO CAPS: 100.0000 mg | ORAL_CAPSULE | Freq: Two times a day (BID) | ORAL | Status: DC

## 2016-03-15 NOTE — ED Notes (Signed)
Pt starting having itching to left hand on Monday.  Site is radiating up hand.  Pt also has noted ?cellulitis/rash to left lateral forearm

## 2016-03-15 NOTE — Discharge Instructions (Signed)

## 2016-03-15 NOTE — ED Provider Notes (Signed)
CSN: [id number]     Arrival date & time 03/15/16  1605 History  By signing my name below, I, [facility], attest that this documentation has been prepared under the direction and in the presence of Arthor Captain, PA-C. Electronically Signed: Randell Patient, ED Scribe. 03/15/2016. 4:56 PM.   Chief Complaint  Patient presents with  . Rash    The history is provided by the patient. No language interpreter was used.   HPI Comments: Brittany Lindsey is a 25 y.o. female with no pertinent chronic conditions who presents to the Emergency Department complaining of a constant, pruritic, erythematous insect bite onset 5 days. Pt states that she was bitten by an insect 6 days ago followed by erythema 4 days ago, right hand swelling 3 days ago, and then by red streaking 3 days ago. She reports associated fatigue. She has scratched the area without relief. Denies taking any medications or attempting any treatments. Denies hx of DM. Denies fevers.  Past Medical History  Diagnosis Date  . PCOS (polycystic ovarian syndrome)   . Tubo-ovarian abscess Feb 2016  . Scoliosis    Past Surgical History  Procedure Laterality Date  . No past surgeries    . Laparoscopy Right 11/02/2015    Procedure: LAPAROSCOPY OPERATIVE with Cure of Hydrosalpynx  with Right Salpingectomy;  Surgeon: Silverio Lay, MD;  Location: [facility];  Service: Gynecology;  Laterality: Right;  . Chromopertubation N/A 11/02/2015    Procedure: CHROMOPERTUBATION;  Surgeon: Silverio Lay, MD;  Location: [facility];  Service: Gynecology;  Laterality: N/A;  . Lysis of adhesion N/A 11/02/2015    Procedure: EXTENSIVE LYSIS OF ADHESION;  Surgeon: Silverio Lay, MD;  Location: [facility];  Service: Gynecology;  Laterality: N/A;   Family History  Problem Relation Age of Onset  . Diabetes Mother   . Seizures Mother   . Hyperlipidemia Mother    Social History  Substance Use Topics  . Smoking status: Former Games developer  . Smokeless tobacco: Never Used  .  Alcohol Use: No   OB History    No data available     Review of Systems  Constitutional: Positive for fatigue. Negative for fever.  Skin: Positive for color change and wound.      Allergies  Mushroom extract complex and Penicillins  Home Medications   Prior to Admission medications   Medication Sig Start Date End Date Taking? Authorizing Provider  acetaminophen (TYLENOL) 500 MG tablet Take 1,000 mg by mouth every 6 (six) hours as needed for mild pain or headache.    Historical Provider, MD  doxycycline (VIBRAMYCIN) 100 MG capsule Take 1 capsule (100 mg total) by mouth 2 (two) times daily. 03/15/16   Arthor Captain, PA-C  ibuprofen (ADVIL,MOTRIN) 600 MG tablet Take 1 tablet (600 mg total) by mouth every 6 (six) hours as needed. 11/02/15   Silverio Lay, MD  metFORMIN (GLUCOPHAGE) 500 MG tablet Take 1 tablet (500 mg total) by mouth 2 (two) times daily with a meal. 01/08/15   Sheliah Hatch, MD  oxyCODONE-acetaminophen (PERCOCET/ROXICET) 5-325 MG tablet Take 1 tablet by mouth every 4 (four) hours as needed for severe pain. 11/02/15   Silverio Lay, MD  predniSONE (DELTASONE) 20 MG tablet Take 2 tablets (40 mg total) by mouth daily. 03/15/16   Arthor Captain, PA-C  triamcinolone (KENALOG) 0.025 % cream Apply 1 application topically 2 (two) times daily. 03/15/16   Zeplin Aleshire, PA-C   BP 129/73 mmHg  Pulse 118  Temp(Src) 98.9 F (37.2 C) (Oral)  Resp 19  Ht 5\' 4"  (1.626 m)  Wt 255 lb (115.667 kg)  BMI 43.75 kg/m2  SpO2 100%  LMP 02/17/2016 Physical Exam  Constitutional: She is oriented to person, place, and time. She appears well-developed and well-nourished. No distress.  HENT:  Head: Normocephalic and atraumatic.  Eyes: Conjunctivae are normal.  Neck: Normal range of motion.  Cardiovascular: Normal rate.   Pulmonary/Chest: Effort normal. No respiratory distress.  Musculoskeletal: Normal range of motion.  Neurological: She is alert and oriented to person, place, and time.   Skin: Skin is warm and dry. There is erythema.  Clear insect bite on distal part of the dorsum of left hand which is 2 cm in diameter with streaking up the wrist. 10 cm wheal on the medial aspect of the left upper arm without signs of infection.  Psychiatric: She has a normal mood and affect. Her behavior is normal.  Nursing note and vitals reviewed.   ED Course  Procedures   DIAGNOSTIC STUDIES: Oxygen Saturation is 100% on RA, normal by my interpretation.    COORDINATION OF CARE: 4:50 PM Will prescribe doxycycline, triamcinolone cream, and 5-day prednisone burst. Discussed treatment plan with pt at bedside and pt agreed to plan.   MDM   Final diagnoses:  Infected insect bite    Patient presentation consistent with cellulitis. Afebrile. No tachycardia, hypotension or other symptoms suggestive of severe infection. Area has been demarcated and pt advised to follow up for wound check in 2-3 days, sooner for worsening systemic symptoms, new lymphangitis, or significant spread of erythema past line of demarcation. Will discharge with doxycycline, triamcinolone cream, and 5-day prednisone burst.. Return precautions discussed. Pt appears safe for discharge.    I personally performed the services described in this documentation, which was scribed in my presence. The recorded information has been reviewed and is accurate.        Arthor Captainbigail Jonanthan Bolender, PA-C 03/15/16 2153  Benjiman CoreNathan Pickering, MD 03/15/16 [phone]

## 2016-05-09 ENCOUNTER — Encounter: Payer: Self-pay | Admitting: Family Medicine

## 2016-05-16 ENCOUNTER — Ambulatory Visit: Payer: Self-pay | Admitting: Family Medicine

## 2016-06-20 DIAGNOSIS — N979 Female infertility, unspecified: Secondary | ICD-10-CM | POA: Diagnosis not present

## 2016-06-20 DIAGNOSIS — N644 Mastodynia: Secondary | ICD-10-CM | POA: Diagnosis not present

## 2016-06-20 DIAGNOSIS — R102 Pelvic and perineal pain: Secondary | ICD-10-CM | POA: Diagnosis not present

## 2016-06-20 MED FILL — PROGESTERONE 200 MG CAPSULE: 200 | 12 days supply | Qty: 12 | Fill #0

## 2016-07-23 MED FILL — PROGESTERONE 200 MG CAPSULE: 200 | 12 days supply | Qty: 12 | Fill #1

## 2016-08-04 DIAGNOSIS — N979 Female infertility, unspecified: Secondary | ICD-10-CM | POA: Diagnosis not present

## 2016-08-21 MED FILL — PROGESTERONE 200 MG CAPSULE: 200 | 12 days supply | Qty: 12 | Fill #2

## 2016-08-22 MED FILL — metFORMIN HCL 1000 MG TABS: 1000 | 60 days supply | Qty: 120 | Fill #0
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
## 2016-09-11 DIAGNOSIS — N979 Female infertility, unspecified: Secondary | ICD-10-CM | POA: Diagnosis not present

## 2016-10-17 DIAGNOSIS — R102 Pelvic and perineal pain: Secondary | ICD-10-CM | POA: Diagnosis not present

## 2016-10-17 DIAGNOSIS — Z3040 Encounter for surveillance of contraceptives, unspecified: Secondary | ICD-10-CM | POA: Diagnosis not present

## 2016-10-17 DIAGNOSIS — Z6841 Body Mass Index (BMI) 40.0 and over, adult: Secondary | ICD-10-CM | POA: Diagnosis not present

## 2016-10-17 DIAGNOSIS — N979 Female infertility, unspecified: Secondary | ICD-10-CM | POA: Diagnosis not present

## 2016-10-17 DIAGNOSIS — N898 Other specified noninflammatory disorders of vagina: Secondary | ICD-10-CM | POA: Diagnosis not present

## 2016-10-17 DIAGNOSIS — Z01419 Encounter for gynecological examination (general) (routine) without abnormal findings: Secondary | ICD-10-CM | POA: Diagnosis not present

## 2016-10-17 DIAGNOSIS — N644 Mastodynia: Secondary | ICD-10-CM | POA: Diagnosis not present

## 2016-10-17 MED FILL — PROGESTERONE 200 MG CAPSULE: 200 | 4 days supply | Qty: 4 | Fill #0

## 2016-10-17 MED FILL — LETROZOLE 2.5 MG TABLET: 2.5 | 30 days supply | Qty: 10 | Fill #0

## 2016-10-17 MED FILL — TINIDAZOLE 500 MG TABLET: 500 | 2 days supply | Qty: 8 | Fill #0

## 2016-10-20 ENCOUNTER — Other Ambulatory Visit: Payer: Self-pay | Admitting: Obstetrics and Gynecology

## 2016-10-20 DIAGNOSIS — N6320 Unspecified lump in the left breast, unspecified quadrant: Secondary | ICD-10-CM

## 2016-10-31 ENCOUNTER — Other Ambulatory Visit: Payer: Self-pay

## 2016-11-10 DIAGNOSIS — E282 Polycystic ovarian syndrome: Secondary | ICD-10-CM | POA: Diagnosis not present

## 2016-11-10 DIAGNOSIS — R102 Pelvic and perineal pain: Secondary | ICD-10-CM | POA: Diagnosis not present

## 2016-11-19 MED FILL — metFORMIN HCL 1000 MG TABS: 1000 | 60 days supply | Qty: 120 | Fill #0

## 2016-11-19 MED FILL — PROGESTERONE 200 MG CAPSULE: 200 | 12 days supply | Qty: 12 | Fill #0

## 2016-11-21 ENCOUNTER — Other Ambulatory Visit: Payer: Self-pay | Admitting: Obstetrics and Gynecology

## 2016-11-21 ENCOUNTER — Ambulatory Visit
Admission: RE | Admit: 2016-11-21 | Discharge: 2016-11-21 | Disposition: A | Discharge status: Home / Self Care | Payer: 59 | Source: Ambulatory Visit | Attending: Obstetrics and Gynecology | Admitting: Obstetrics and Gynecology

## 2016-11-21 DIAGNOSIS — E282 Polycystic ovarian syndrome: Secondary | ICD-10-CM | POA: Diagnosis not present

## 2016-11-21 DIAGNOSIS — N6320 Unspecified lump in the left breast, unspecified quadrant: Secondary | ICD-10-CM

## 2016-11-21 DIAGNOSIS — N644 Mastodynia: Secondary | ICD-10-CM

## 2016-11-21 DIAGNOSIS — Z319 Encounter for procreative management, unspecified: Secondary | ICD-10-CM | POA: Diagnosis not present

## 2016-11-21 MED FILL — PIOGLITAZONE HCL 30 MG TAB: 30 | 30 days supply | Qty: 30 | Fill #0

## 2016-11-21 MED FILL — LETROZOLE 2.5 MG TABLET: 2.5 | 30 days supply | Qty: 15 | Fill #0

## 2017-06-29 ENCOUNTER — Encounter: Payer: Self-pay | Admitting: Internal Medicine

## 2017-07-21 ENCOUNTER — Ambulatory Visit: Payer: Self-pay | Admitting: Internal Medicine

## 2017-09-17 MED FILL — KETOROLAC 10 MG TABLET: 10 | 5 days supply | Qty: 20 | Fill #0

## 2017-09-17 MED FILL — predniSONE 5 MG (21) TBPK: 5 | 6 days supply | Qty: 21 | Fill #0

## 2017-09-17 MED FILL — METHOCARBAMOL 500 MG TABS: 500 | 10 days supply | Qty: 30 | Fill #0

## 2017-09-30 MED FILL — VITAMIN D3 50000 UNIT CAPS: 1.25 MG | 28 days supply | Qty: 4 | Fill #0

## 2017-10-19 MED FILL — PIOGLITAZONE HCL 30 MG TAB: 30 | 30 days supply | Qty: 30 | Fill #0

## 2017-10-19 MED FILL — MEDROXYPROGESTERONE 10 MG T: 10 | 10 days supply | Qty: 10 | Fill #0

## 2017-10-27 MED FILL — metroNIDAZOLE 500 MG TABS: 500 | 7 days supply | Qty: 14 | Fill #0

## 2017-10-27 MED FILL — FLUCONAZOLE 150 MG TABLET: 150 | 2 days supply | Qty: 2 | Fill #0

## 2017-11-13 ENCOUNTER — Emergency Department (HOSPITAL_COMMUNITY)
Admission: EM | Admit: 2017-11-13 | Discharge: 2017-11-14 | Disposition: A | Discharge status: Home / Self Care | Payer: PRIVATE HEALTH INSURANCE | Attending: Emergency Medicine | Admitting: Emergency Medicine

## 2017-11-13 ENCOUNTER — Other Ambulatory Visit: Payer: Self-pay

## 2017-11-13 ENCOUNTER — Encounter (HOSPITAL_COMMUNITY): Payer: Self-pay

## 2017-11-13 ENCOUNTER — Emergency Department (HOSPITAL_COMMUNITY): Payer: PRIVATE HEALTH INSURANCE

## 2017-11-13 DIAGNOSIS — R1032 Left lower quadrant pain: Secondary | ICD-10-CM | POA: Diagnosis present

## 2017-11-13 DIAGNOSIS — R7303 Prediabetes: Secondary | ICD-10-CM | POA: Diagnosis not present

## 2017-11-13 DIAGNOSIS — E282 Polycystic ovarian syndrome: Secondary | ICD-10-CM | POA: Insufficient documentation

## 2017-11-13 DIAGNOSIS — Z87891 Personal history of nicotine dependence: Secondary | ICD-10-CM | POA: Diagnosis not present

## 2017-11-13 DIAGNOSIS — Z7984 Long term (current) use of oral hypoglycemic drugs: Secondary | ICD-10-CM | POA: Diagnosis not present

## 2017-11-13 DIAGNOSIS — Z79899 Other long term (current) drug therapy: Secondary | ICD-10-CM | POA: Diagnosis not present

## 2017-11-13 LAB — URINALYSIS, ROUTINE W REFLEX MICROSCOPIC
Bilirubin Urine: NEGATIVE
Glucose, UA: NEGATIVE mg/dL
Hgb urine dipstick: NEGATIVE
Ketones, ur: NEGATIVE mg/dL
LEUKOCYTES UA: NEGATIVE
Nitrite: NEGATIVE
PH: 7 (ref 5.0–8.0)
Protein, ur: NEGATIVE mg/dL
SPECIFIC GRAVITY, URINE: 1.019 (ref 1.005–1.030)

## 2017-11-13 LAB — COMPREHENSIVE METABOLIC PANEL
ALBUMIN: 3.8 g/dL (ref 3.5–5.0)
ALT: 29 U/L (ref 14–54)
ANION GAP: 7 (ref 5–15)
AST: 25 U/L (ref 15–41)
Alkaline Phosphatase: 47 U/L (ref 38–126)
BILIRUBIN TOTAL: 0.6 mg/dL (ref 0.3–1.2)
BUN: 10 mg/dL (ref 6–20)
CO2: 27 mmol/L (ref 22–32)
Calcium: 8.6 mg/dL — ABNORMAL LOW (ref 8.9–10.3)
Chloride: 104 mmol/L (ref 101–111)
Creatinine, Ser: 0.87 mg/dL (ref 0.44–1.00)
GFR calc Af Amer: >60 mL/min (ref >60)
Glucose, Bld: 98 mg/dL (ref 65–99)
Potassium: 3.5 mmol/L (ref 3.5–5.1)
Sodium: 138 mmol/L (ref 135–145)
TOTAL PROTEIN: 7.1 g/dL (ref 6.5–8.1)

## 2017-11-13 LAB — CBC
HEMATOCRIT: 39.6 % (ref 36.0–46.0)
HEMOGLOBIN: 12.8 g/dL (ref 12.0–15.0)
MCH: 28.3 pg (ref 26.0–34.0)
MCHC: 32.3 g/dL (ref 30.0–36.0)
MCV: 87.6 fL (ref 78.0–100.0)
Platelets: 282 10*3/uL (ref 150–400)
RBC: 4.52 MIL/uL (ref 3.87–5.11)
RDW: 13.1 % (ref 11.5–15.5)
WBC: 6.3 10*3/uL (ref 4.0–10.5)

## 2017-11-13 LAB — WET PREP, GENITAL
Sperm: NONE SEEN
Trich, Wet Prep: NONE SEEN
WBC WET PREP: NONE SEEN
YEAST WET PREP: NONE SEEN

## 2017-11-13 LAB — I-STAT BETA HCG BLOOD, ED (MC, WL, AP ONLY): I-stat hCG, quantitative: 40.1 m[IU]/mL — ABNORMAL HIGH (ref <5)

## 2017-11-13 LAB — LIPASE, BLOOD: Lipase: 25 U/L (ref 11–51)

## 2017-11-13 NOTE — ED Triage Notes (Signed)
Patient reports that she is taking Menopur injection and HCG injection in order to get pregnant. Patient called the fertility clinic and OB/GYN and was told to come to the ED. Patient states she began having left lower abdominal cramping that has been getting worse. Patient states the pain radiates into her left back area.

## 2017-11-13 NOTE — Discharge Instructions (Signed)
As we discussed, you need to follow-up with your fertility doctor on Monday to have a repeat beta hCG test drawn.  Yours was positive today.  This may be a result of your injection but needs to be followed up.  Additionally, you need to follow-up regarding her ultrasound findings.  As we discussed, there is a possible cyst on the left ovary that could be causing her symptoms.  Take Tylenol for pain.  Do not exceed 4000 mg of Tylenol a day.  Return to the emergency department for any fever, worsening abdominal pain, vaginal bleeding, vaginal discharge chest pain, difficulty breathing or any other worsening or concerning symptoms.
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
## 2017-11-16 LAB — GC/CHLAMYDIA PROBE AMP (~~LOC~~) NOT AT ARMC
Chlamydia: NEGATIVE
NEISSERIA GONORRHEA: NEGATIVE

## 2018-06-30 MED FILL — FLUCONAZOLE 150 MG TABS: 150 | 3 days supply | Qty: 2 | Fill #0

## 2018-06-30 MED FILL — metroNIDAZOLE 500 MG TABS: 500 | 7 days supply | Qty: 14 | Fill #0

## 2018-08-17 MED FILL — metFORMIN HCL ER 500 MG TB2: 500 | 30 days supply | Qty: 60 | Fill #0

## 2018-11-09 IMAGING — US US TRANSVAGINAL NON-OB
1 series · 13 of 25 positions shown · non-contrast
Comparison: None.

CLINICAL DATA: Initial evaluation for acute left-sided pelvic pain
for 3 days.

EXAM:
TRANSABDOMINAL AND TRANSVAGINAL ULTRASOUND OF PELVIS
DOPPLER ULTRASOUND OF OVARIES
TECHNIQUE: Both transabdominal and transvaginal ultrasound examinations of the
pelvis were performed. Transabdominal technique was performed for
global imaging of the pelvis including uterus, ovaries, adnexal
regions, and pelvic cul-de-sac.
It was necessary to proceed with endovaginal exam following the
transabdominal exam to visualize the uterus, endometrium, and
ovaries. Color and duplex Doppler ultrasound was utilized to
evaluate blood flow to the ovaries.

[Series 1: us transvaginal non-ob · 0.22mm/px · 13 of 98 slices shown]
[im 1/98]
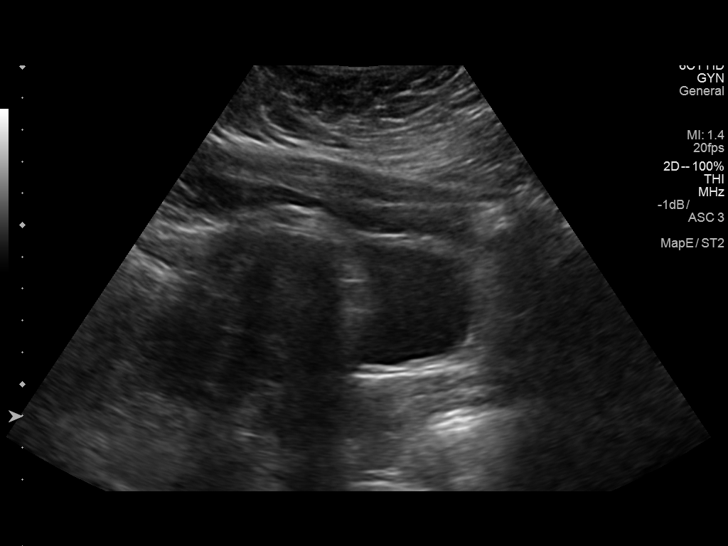
[im 9/98]
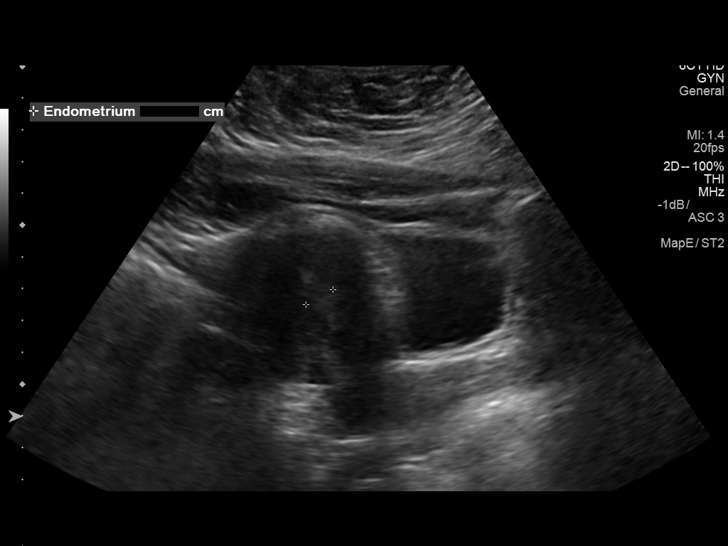
[im 17/98]
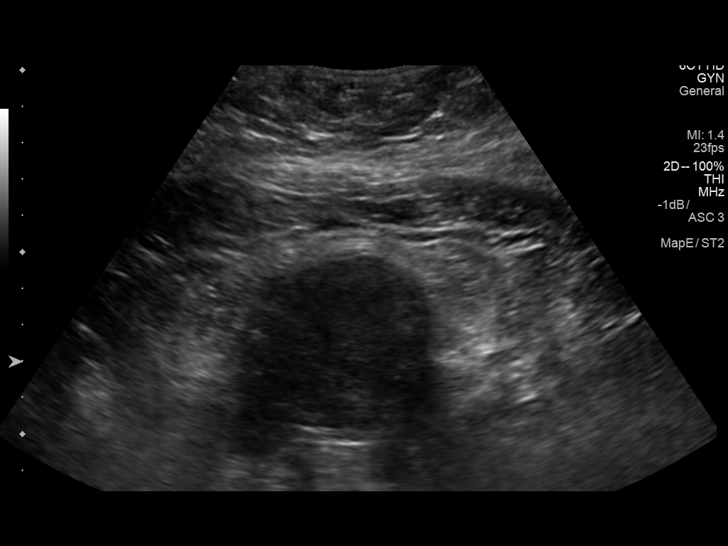
[im 25/98]
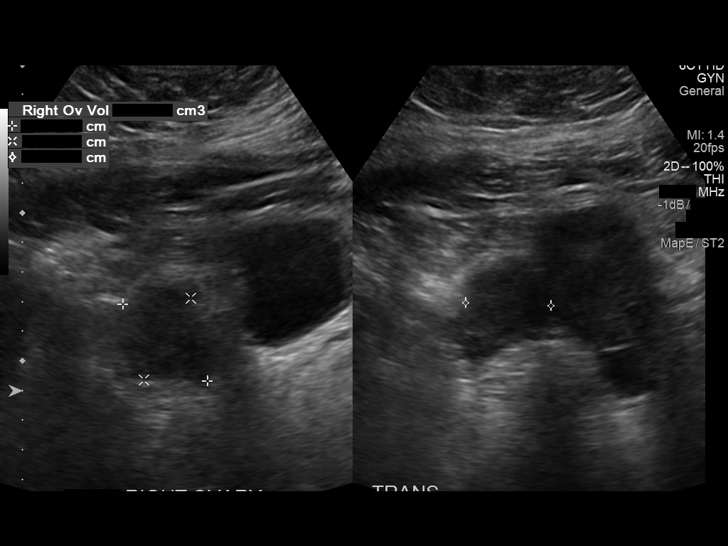
[im 33/98]
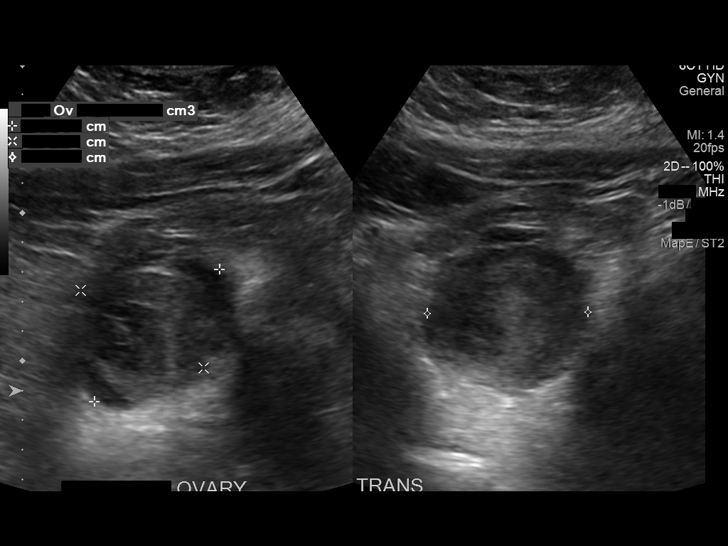
[im 41/98]
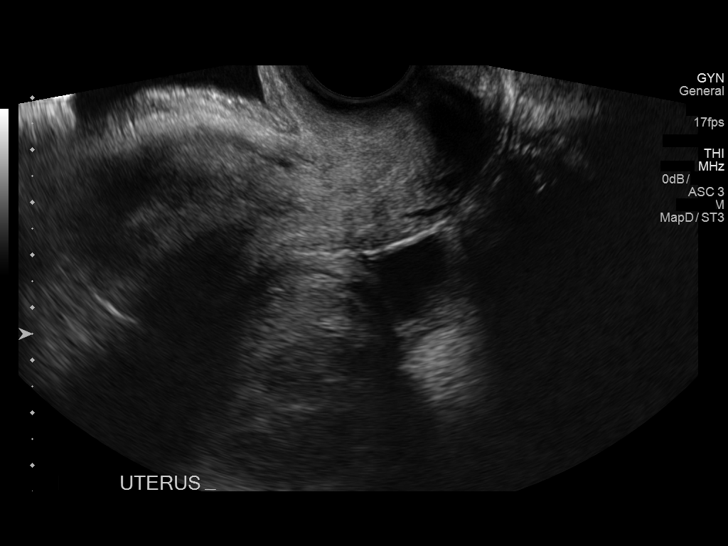
[im 49/98]
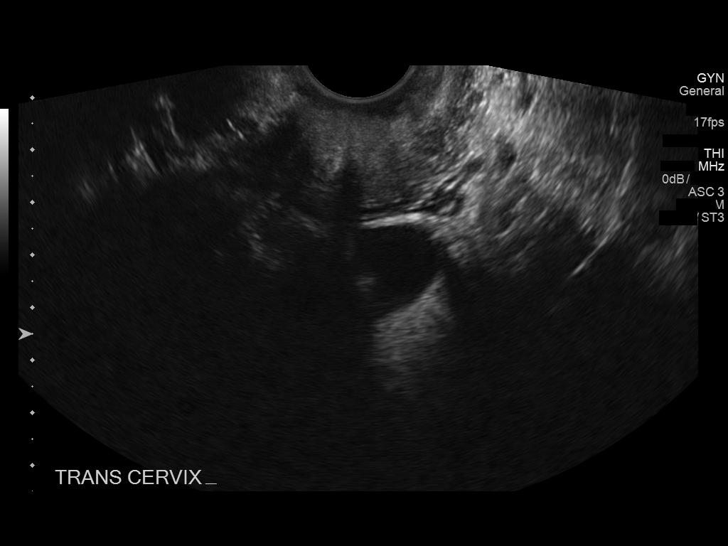
[im 57/98]
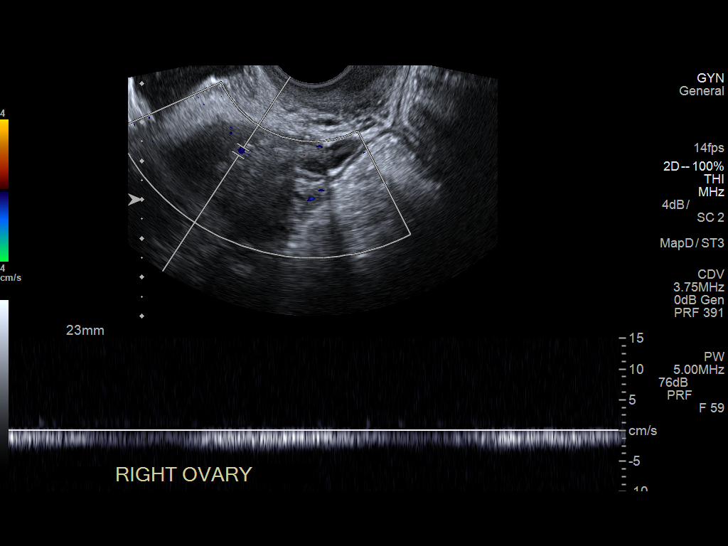
[im 65/98]
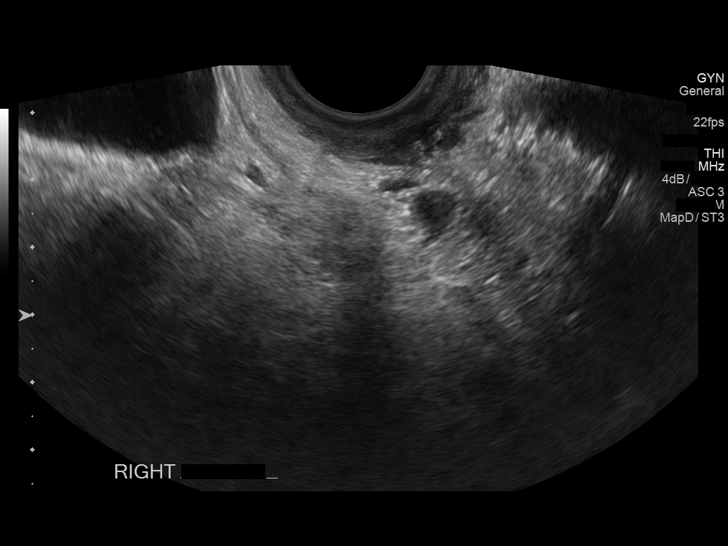
[im 73/98]
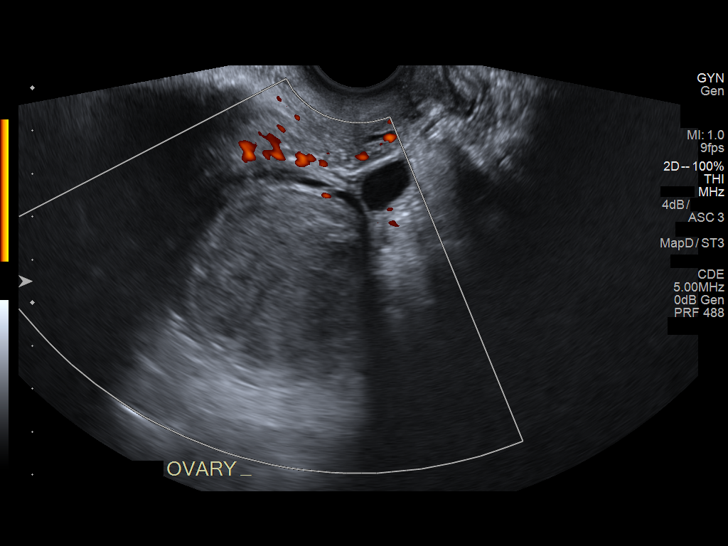
[im 81/98]
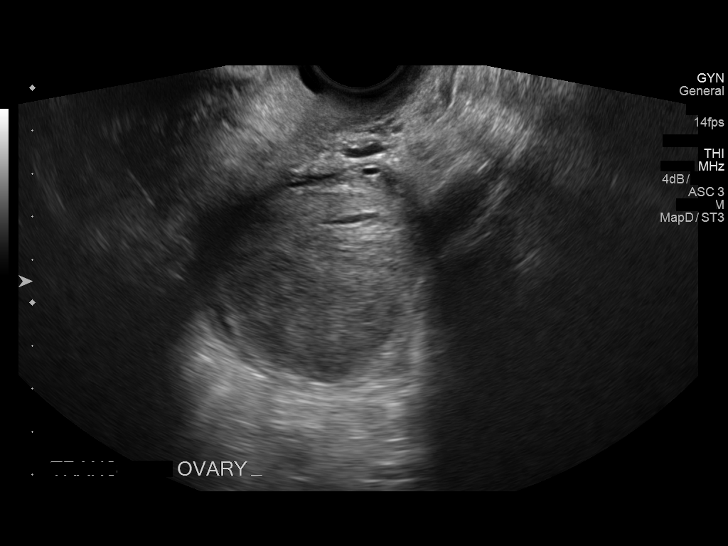
[im 89/98]
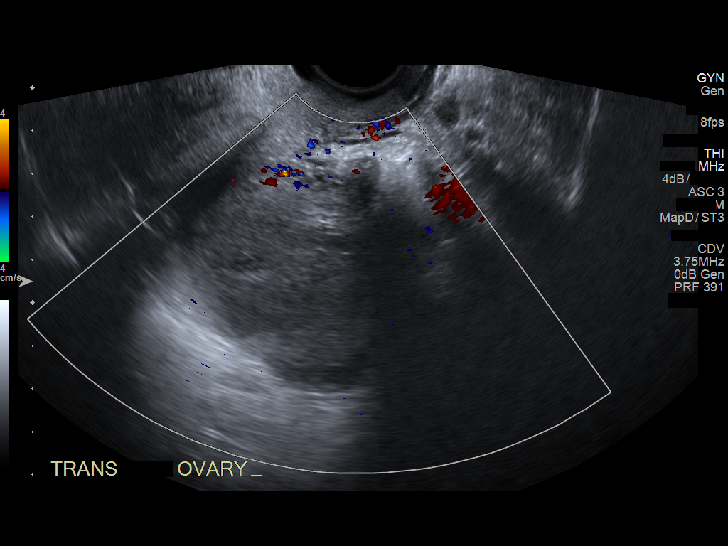
[im 98/98]
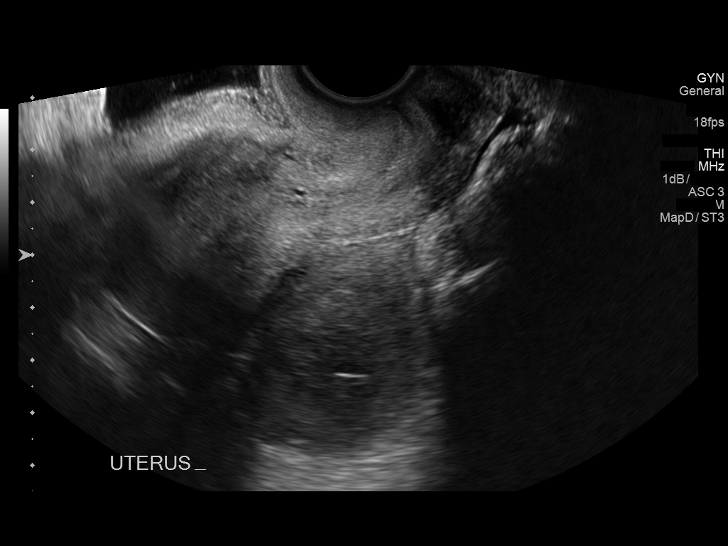

[13 of 25 positions shown; findings below may reference images not displayed]

FINDINGS: Uterus

Measurements: 8.1 x 3.5 x 3.1 cm. No fibroids or other mass
visualized.

Endometrium

Thickness: 7.9 mm.  No focal abnormality visualized.

Right ovary

Measurements: 3.5 x 2.7 x 2.5 cm. Normal appearance/no adnexal mass.

Left ovary

Measurements: 5.2 x 5.3 x 5.4 cm. There is a complex isoechoic
lesion measuring 4.3 x 4.3 x 4.1 cm largely replacing the right
ovary. Thin rim of normal ovarian tissue seen pancaked around the
periphery of this lesion. No internal vascularity. Finding is
indeterminate.

Pulsed Doppler evaluation of both ovaries demonstrates normal
low-resistance arterial and venous waveforms. Flow within the left
ovary is somewhat difficult to discern, but is seen within the
normal rim of ovarian tissue pancaked around the left ovarian
lesion.

Other findings

No abnormal free fluid.
IMPRESSION: 1. 4.3 x 4.3 x 4.1 cm complex left ovarian lesion, indeterminate,
but could reflect a complex proteinaceous and/or hemorrhagic cyst. A
short interval follow-up study in 6-12 weeks is recommended for
further evaluation.
2. No other acute abnormality within the pelvis. No evidence for
ovarian torsion.

## 2019-01-05 MED FILL — METRONIDAZOLE 500 MG TABS: 500 | 7 days supply | Qty: 14 | Fill #0

## 2019-01-06 MED FILL — VIT D2 1.25 MG (50,000 UNIT: 1.25 MG | 56 days supply | Qty: 8 | Fill #0

## 2019-01-18 MED FILL — FEMYNOR 0.25-35 MG-MCG TABS: 0.25-35 | 84 days supply | Qty: 84 | Fill #0

## 2019-03-07 MED FILL — VIT D2 1.25 MG (50,000 UNIT: 1.25 MG | 28 days supply | Qty: 4 | Fill #0

## 2019-04-05 MED FILL — FEMYNOR 0.25-35 MG-MCG TABS: 0.25-35 | 84 days supply | Qty: 84 | Fill #0

## 2019-04-05 MED FILL — VIT D2 1.25 MG (50,000 UNIT: 1.25 MG | 28 days supply | Qty: 4 | Fill #0

## 2019-04-13 ENCOUNTER — Encounter: Payer: Self-pay | Admitting: Family Medicine

## 2019-04-13 ENCOUNTER — Encounter (INDEPENDENT_AMBULATORY_CARE_PROVIDER_SITE_OTHER): Payer: Self-pay

## 2019-04-13 ENCOUNTER — Telehealth: Payer: PRIVATE HEALTH INSURANCE | Admitting: Family

## 2019-04-13 DIAGNOSIS — Z20822 Contact with and (suspected) exposure to covid-19: Secondary | ICD-10-CM

## 2019-04-13 NOTE — Progress Notes (Signed)
E-Visit for Corona Virus Screening   Your current symptoms could be consistent with the coronavirus.  Many health care providers can now test patients at their office but not all are.  [facility] has multiple testing sites. For information on our COVID testing locations and hours go to [email or web address]  Please quarantine yourself while awaiting your test results.  We are enrolling you in our MyChart Home [facility] for COVID19 . Daily you will receive a questionnaire within the MyChart website. Our COVID 19 response team willl be monitoriing your responses daily.  Approximately 5 minutes was spent documenting and reviewing patient's chart.   You can go to one of the  testing sites listed below, while they are opened (see hours). You do need to self isolate until your results return and if positive 14 days from when your symptoms started and until you are 3 days symptom free.   Testing Locations (Monday - Friday, 8 a.m. - 3:30 p.m.) . [place]: [facility], [address], [place], [place]  . [place]: [facility], [address], [place], [place] (entrance off [address])  . [place]: [address], [place],  (across from  Emergency Department)   COVID-19 is a respiratory illness with symptoms that are similar to the flu. Symptoms are typically mild to moderate, but there have been cases of severe illness and death due to the virus. The following symptoms may appear 2-14 days after exposure: . Fever . Cough . Shortness of breath or difficulty breathing . Chills . Repeated shaking with chills . Muscle pain . Headache . Sore throat . New loss of taste or smell . Fatigue . Congestion or runny nose . Nausea or vomiting . Diarrhea  It is vitally important that if you feel that you have an infection such as this virus or any other virus that you stay home and away from  places where you may spread it to others.  You should self-quarantine for 14 days if you have symptoms that could potentially be coronavirus or have been in close contact a with a person diagnosed with COVID-19 within the last 2 weeks. You should avoid contact with people age 65 and older.   You should wear a mask or cloth face covering over your nose and mouth if you must be around other people or animals, including pets (even at home). Try to stay at least 6 feet away from other people. This will protect the people around you.  You may also take acetaminophen (Tylenol) as needed for fever.   Reduce your risk of any infection by using the same precautions used for avoiding the common cold or flu:  . Wash your hands often with soap and warm water for at least 20 seconds.  If soap and water are not readily available, use an alcohol-based hand sanitizer with at least 60% alcohol.  . If coughing or sneezing, cover your mouth and nose by coughing or sneezing into the elbow areas of your shirt or coat, into a tissue or into your sleeve (not your hands). . Avoid shaking hands with others and consider head nods or verbal greetings only. . Avoid touching your eyes, nose, or mouth with unwashed hands.  . Avoid close contact with people who are sick. . Avoid places or events with large numbers of people in one location, like concerts or sporting events. . Carefully consider travel plans you have or are making. . If you   are planning any travel outside or inside the [place], visit the CDC's Travelers' Health webpage for the latest health notices. . If you have some symptoms but not all symptoms, continue to monitor at home and seek medical attention if your symptoms worsen. . If you are having a medical emergency, call 911.  HOME CARE . Only take medications as instructed by your medical team. . Drink plenty of fluids and get plenty of rest. . A steam or ultrasonic humidifier can help if you have congestion.    GET HELP RIGHT AWAY IF YOU HAVE EMERGENCY WARNING SIGNS** FOR COVID-19. If you or someone is showing any of these signs seek emergency medical care immediately. Call 911 or proceed to your closest emergency facility if: . You develop worsening high fever. . Trouble breathing . Bluish lips or face . Persistent pain or pressure in the chest . New confusion . Inability to wake or stay awake . You cough up blood. . Your symptoms become more severe  **This list is not all possible symptoms. Contact your medical provider for any symptoms that are sever or concerning to you.   MAKE SURE YOU   Understand these instructions.  Will watch your condition.  Will get help right away if you are not doing well or get worse.  Your e-visit answers were reviewed by a board certified advanced clinical practitioner to complete your personal care plan.  Depending on the condition, your plan could have included both over the counter or prescription medications.  If there is a problem please reply once you have received a response from your provider.  Your safety is important to us.  If you have drug allergies check your prescription carefully.    You can use MyChart to ask questions about today's visit, request a non-urgent call back, or ask for a work or school excuse for 24 hours related to this e-Visit. If it has been greater than 24 hours you will need to follow up with your provider, or enter a new e-Visit to address those concerns. You will get an e-mail in the next two days asking about your experience.  I hope that your e-visit has been valuable and will speed your recovery. Thank you for using e-visits.    

## 2019-04-14 ENCOUNTER — Other Ambulatory Visit: Payer: Self-pay

## 2019-04-14 DIAGNOSIS — Z20822 Contact with and (suspected) exposure to covid-19: Secondary | ICD-10-CM

## 2019-04-15 ENCOUNTER — Encounter (INDEPENDENT_AMBULATORY_CARE_PROVIDER_SITE_OTHER): Payer: Self-pay

## 2019-04-16 ENCOUNTER — Encounter (INDEPENDENT_AMBULATORY_CARE_PROVIDER_SITE_OTHER): Payer: Self-pay

## 2019-04-16 LAB — NOVEL CORONAVIRUS, NAA: SARS-CoV-2, NAA: NOT DETECTED

## 2019-04-16 LAB — SPECIMEN STATUS REPORT

## 2019-04-28 ENCOUNTER — Telehealth (HOSPITAL_BASED_OUTPATIENT_CLINIC_OR_DEPARTMENT_OTHER): Payer: Self-pay

## 2019-04-28 NOTE — Telephone Encounter (Signed)
Contacted pt regarding New OB appointment and completed a New OB Intake form.   As per pt , is unsure of LMP but, reports 7.04.2020 G2P0. Pt is married and happy about pregnancy. But this pregnancy was not planned. Pt denied HTN, and diabetes. Pt admits smoking "few cigarettes" and THC .Pt reports consuming "wine" prior to pregnancy.   Pt denied taking any prescribed medication.    Pt admits MH problems and was dx 2 years ago with Bipolar II disorder  Generalized Anxiety disorder  PTSD.      New OB Intake form scanned to media and pt will complete the  Ecare sign up.

## 2019-05-01 NOTE — Progress Notes (Signed)
NEW OB OFFICE VISIT    CC:  28 year old G19P0010 female at [redacted]w[redacted]d by 6 week [place] who presents for a new OB visit.      HPP:     #. Pregnancy  Menses: Patient's last menstrual period was 03/12/2019 (approximate).     Chicken Pox: Hx as a child     Pregnancy Sx: Mild nausea, spotting about 2 weeks ago      Review of Dates:  LMP  03/12/19   --> EDD 12/17/19  -->  EGA 7+2 WGA  Ultrasound  05/02/19 --->  EDD 12/24/19  --> EGA  6+2 WGA  Authoritative EDD: 12/24/19     Problem List   # Spotting in 1st trimester, possible subchorionic hemorrhage     Prenatal Labs  Blood Type: _ Antibody: _  Hct: _  Rubella _, Syphilis _, HBsAg _, HIV _   Pap _, GCCT _, UCx _   Glucola _, GBS _   Chicken Pox: Hx as a child    Immunizations:   Influenza_  Tdap _  RhoGAM _     Prenatal Screening  See details on discussion below     Prenatal Ultrasound(s)  _     Past OB History    1. Early TOP   2. Current     Past GYN History   Menarche age: 28   Menses: Regular Q28 days prior to pregnancy   History of STIs: hx of chlamydia s/p tx   Last pap: 09/2016 NILM  Pap history: no history of abnormal paps  Coitarche age: Not asked today  Gyn surgeries: None     Past Medical History:  Past Medical History:   Diagnosis Date   . Anxiety    . Depression        Past Surgical History:  Past Surgical History:   Procedure Laterality Date   . NO PRIOR SURGERIES         Medications:    Current Outpatient Medications:   .  Prenatal Vit-Fe Fumarate-FA (prenatal mutlivitamin iron-folic acid) 16-1 MG tablet, Take 1 tablet by mouth daily., Disp: 90 tablet, Rfl: 6    Allergies:  Review of patient's allergies indicates:  No Known Allergies    Family History:    No hx of breast, ovarian, uterine or colon cancer.     Inherited or Genetic Conditions: none known, mother is [place] but unsure if Ashkenazi      Cystic Fibrosis:  European Caucasians and [place] European Jews  [place], [place], Familial Dysautonomia: [place] European Jews, Ashkenazi Jews   Sickle Cell, Thalassemia: AA,  [place], CF: [place]     Social History:  Social History     Tobacco Use   . Smoking status: Former Smoker   Substance Use Topics   . Alcohol use: Not Currently   . Drug use: Not Currently     Marital status: husband  FOB Name: Mattia    ROS:  Extended 2-9, Complete 10+   Constitutional: unusual fatigue    Eyes: Negative    Ears, Nose, Mouth, Throat: Negative    Cardiovascular: Negative    Respiratory: Negative    Gastrointestinal: Positive for abdominal pain.   Genitourinary: Negative   Musculoskeletal: Negative    Skin: Negative    Neurological: Negative    Psychiatric: Positive for anxiety.   Endocrine: Negative    Hematologic/Lymphatic: Negative   Allergic/Immunologic: Negative  Physical Exam:  1995   Detailed - 5-7 systems, Comprehensive -8+  BP 118/76   Pulse 76   Ht 5\' 4"  (1.626 m)   Wt 146 lb (66.2 kg)   LMP 03/12/2019 (Approximate)   BMI 25.06 kg/m   General: healthy, alert, no distress.  Neck: No overt thyroid enlargement.  Breasts:  Inspection:  Normal breast symmetry,  normal nipples without discharge.No skin lesions. Palpation: Normal breast tissue bilaterally, no tenderness, no suspicious masses. Axillary nodes: No adenopathy.  Cardiac: Normal pulses in the lower extremities with normal capillary refill, No edema or varicosities.  Respiratory: Normal respiratory effort and chest wall movement with respiration.   Abdomen: soft, non-tender, no HSM or masses.   Psychiatric:   Mood/affect:  Normal.  Orientation: oriented to time, person and place  Neurologic:  Gait:  Normal.  Skin: Skin color, texture, turgor normal. No rashes or concerning lesions on visible areas.  Nodes: Inguinal areas: No adenopathy.  Pelvic Exam: External genitalia normal, normal bartholin/skene/urethral meatus/anus., Vagina is rugated and well-estrogenized, cervix normal in appearance, no CMT, no bladder tenderness, uterus normal size, shape, and consistency,  no adnexal masses or tenderness. A vaginal swab for GC/CT was collected.     Transvaginal ultrasound: Single intrauterine pregnancy confirmed.  CRL  5.2097mm  Consistent with EDD  12/24/19  Presence of fetal cardiac motion.  No adnexal abnormalities noted. Possible small subchorionic hemorrhage seen.    A&P: 28 year old 392P0010 female at 4559w2d by LMP here for her new OB visit.    # OB   -IUP Confirmed 05/02/19  -Prenatal labs ordered.   -Continue prenatal vitamins. Hx of chicken pox in past.   -Pre-pregnancy BMI: 24.88, recommended 25-35 pounds of weight gain this pregnancy     # Possible subchorionic hemorrhage:   - Patient with spotting 2 weeks ago and small fluid collection seen on US consistent with possible subchorionic hemorrhage. Discussed these findings with the patient and possibility of future bleeding as well as increased risk of miscarriage.   - Blood type today  - Instructed patient to inform us if she has continued bleeding    # FWB: Fetus with dating by 6 wk ultrasound.   - Unsure LMP and US today 7 days discordant from LMP    # Prenatal Screening for aneuploidy and NTD explained. Discussed options for prenatal diagnosis including both non-invasive and invasive screening.   -Maternal age related aneuploidy risk: low   -Patient would like: Unsure, info for cfDNA vs IS given   -Patient will call insurance re SMA and CF testing.    # Vaccines: Influenza vaccine in fall, Tdap between 27-36 weeks.    # Prenatal Teaching:  1st Trimester   -staying hydrated, prenatal vitamin (800mcg folic acid)   -getting regular exercise, ease into it if beginning   -no smoking, no alcohol, call before using any OTC drugs or herbal meds   -intercourse is fine in normal pregnancies   -DIET: avoid raw and undercooked meat and fish, unpasteurized cheese.   -use gloves and wash hands if cleaning kitty litter box   -avoid getting overheated, do not use saunas or hot tubs, work out in well ventilated room and drink cool fluids   -call  with questions or concerns. Clinic or L&D (after 20 weeks)   -signs of miscarriage-bleeding, lots of cramping   -discomfort relief measures   -omega 3 supplements or fish consumption 2x/week   -wear seat belts low   -Vitamin D supplementation 2000  IU daily in addition to prenatal vitamins if desired     Follow-up planned:  ~6weeks   At Next Visit: NT US, review PNL    - <28wga = q4week visits                          - 11-14wga: NT US with PAPP-A if integrated screen                          - 16-18wga: Quad screen (16-22wga) or isolated AFP if cfDNA done                          - 18-20wga: Anatomy US                          - 26-28wga: Glucola, repeat Hct, Rhogam if Rh-, HIV/RPR if high risk              - 28-36wga = q2week visits                          - 28-32wga: TDaP                          - 35-37wga: GBS swab, repeat GC/CT if indicated, US for position                          - 36wga: weekly NSTs for AMA              -37-41wga = weekly visits                          - schedule IOL for 41 weeks if not delivered

## 2019-05-02 ENCOUNTER — Ambulatory Visit: Payer: PPO | Attending: Obstetrics & Gynecology | Admitting: Unknown Physician Specialty

## 2019-05-02 ENCOUNTER — Encounter (HOSPITAL_BASED_OUTPATIENT_CLINIC_OR_DEPARTMENT_OTHER): Payer: Self-pay | Admitting: Unknown Physician Specialty

## 2019-05-02 VITALS — BP 118/76 | HR 76 | Ht 64.0 in | Wt 146.0 lb

## 2019-05-02 DIAGNOSIS — O209 Hemorrhage in early pregnancy, unspecified: Secondary | ICD-10-CM | POA: Insufficient documentation

## 2019-05-02 DIAGNOSIS — O26851 Spotting complicating pregnancy, first trimester: Secondary | ICD-10-CM

## 2019-05-02 DIAGNOSIS — Z3A01 Less than 8 weeks gestation of pregnancy: Secondary | ICD-10-CM | POA: Insufficient documentation

## 2019-05-02 LAB — CBC (HEMOGRAM)
Hematocrit: 40 % (ref 36–45)
Hemoglobin: 13.5 g/dL (ref 11.5–15.5)
MCH: 31.7 pg (ref 27.3–33.6)
MCHC: 33.5 g/dL (ref 32.2–36.5)
MCV: 95 fL (ref 81–98)
Platelet Count: 244 10*3/uL (ref 150–400)
RBC: 4.26 10*6/uL (ref 3.80–5.00)
RDW-CV: 12.4 % (ref 11.6–14.4)
WBC: 10.4 10*3/uL — ABNORMAL HIGH (ref 4.3–10.0)

## 2019-05-02 LAB — PRENATAL BLOOD TYPE AND ANTIBODY SCREEN
ABO/Rh: A POS
Antibody Screen: NEGATIVE

## 2019-05-02 MED ORDER — PRENATABS FA 29-1 MG OR TABS
1.0000 | ORAL_TABLET | Freq: Every day | ORAL | 6 refills | Status: AC
Start: 2019-05-02 — End: ?

## 2019-05-02 NOTE — Patient Instructions (Signed)
Tests to call insurance about:  - Cell Free DNA  - Cystic Fibrosis Screening  - Hemoglobin electrophoresis   - Spinal muscular atrophy screening

## 2019-05-03 LAB — SYPHILIS SCREEN: T. pallidum IgG/IgM Ab (Bioplex): NONREACTIVE

## 2019-05-03 LAB — HIV ANTIGEN AND ANTIBODY SCRN
HIV Antigen and Antibody Interpretation: NONREACTIVE
HIV Antigen and Antibody Result: NONREACTIVE

## 2019-05-03 LAB — GC&CHLAM NUCLEIC ACID DETECTN
Chlam Trachomatis Nucleic Acid: NEGATIVE
N.Gonorrhoeae(GC) Nucleic Acid: NEGATIVE

## 2019-05-03 LAB — RUBELLA IMMUNE STATUS BY EIA
Rubella Antibody Level: 18.9 [IU]/mL
Rubella Immune Status Result: POSITIVE

## 2019-05-03 LAB — HEPATITIS B SURFACE ANTIGEN W/REFLEX: Hepatitis B Surface Antigen w/Reflex: NONREACTIVE

## 2019-05-03 LAB — HEPATITIS C AB WITH REFLEX PCR: Hepatitis C Antibody w/Rflx PCR: NONREACTIVE

## 2019-05-04 LAB — URINE C/S: Culture: 1000

## 2019-05-05 NOTE — Progress Notes (Signed)
Attending Attestation  I saw and evaluated the patient. I have reviewed and edited the resident's documentation and agree with it.    Estefani Bateson, MD, MBA

## 2019-05-09 MED FILL — FLUCONAZOLE 150 MG TABS: 150 | 4 days supply | Qty: 2 | Fill #0

## 2019-05-09 MED FILL — METRONIDAZOLE 500 MG TABS: 500 | 7 days supply | Qty: 14 | Fill #0

## 2019-06-02 ENCOUNTER — Ambulatory Visit: Payer: PPO | Attending: Obstetrics & Gynecology | Admitting: Unknown Physician Specialty

## 2019-06-02 VITALS — BP 115/74 | HR 88 | Temp 97.9°F | Wt 143.8 lb

## 2019-06-02 DIAGNOSIS — Z3A11 11 weeks gestation of pregnancy: Secondary | ICD-10-CM | POA: Insufficient documentation

## 2019-06-02 DIAGNOSIS — Z3A10 10 weeks gestation of pregnancy: Secondary | ICD-10-CM

## 2019-06-02 NOTE — Progress Notes (Addendum)
RETURN OB VISIT    ID/CC: 28 year old G38P0010 female at [redacted]w[redacted]d by 6 week [place] who presents for a return OB visit.    HPP: Pregnancy complicated by problem list below.     - Patient doing well today  - No VB, LOF  - No cramping    PRIMARY OB: Imya Mance    REVIEW OF DATES  LMP  03/12/19   --> EDD 12/17/19  -->  EGA 7+2 WGA  Ultrasound  05/02/19 --->  EDD 12/24/19  --> EGA  6+2 WGA  Authoritative EDD: 12/24/19     Problem List   # Spotting in 1st trimester, possible subchorionic hemorrhage     Prenatal Labs  Blood Type: A+ Antibody: neg  Hct: 40  Rubella immune, Syphilis NR, HBsAg NR, HCV NR HIV NR  Pap 09/2016 NILM, GCCT neg, UCx 1K-10K mixed flora  Glucola _, Hct _   GBS _    Chicken Pox: Hx as child    Prenatal Screening  Undecided  CF screen: Undecided     Prenatal Ultrasound(s)  [place] (_) @ _: _     Immunizations  Influenza: _  TdAP: _  RhoGAM: _     Birth Plan  Breastfeeding: (date discussed)  Contraception:   Peds:    Past OB History    1. Early TOP   2. Current      Past GYN History   Menarche age: 28   Menses: Regular Q28 days prior to pregnancy   History of STIs: hx of chlamydia s/p tx   Last pap: 09/2016 NILM  Pap history: no history of abnormal paps  Coitarche age: Not asked today  Gyn surgeries: None       Patient Active Problem List    Diagnosis Date Noted   . Bleeding in early pregnancy [O20.9] 05/02/2019       Medications:    Current Outpatient Medications:   .  Prenatal Vit-Fe Fumarate-FA (prenatal mutlivitamin iron-folic acid) 29-1 MG tablet, Take 1 tablet by mouth daily., Disp: 90 tablet, Rfl: 6    Allergies:  Review of patient's allergies indicates:  No Known Allergies    ROS:     Constitutional: Negative    Gastrointestinal: Negative   Genitourinary: Negative    Physical Exam:   Vitals:    06/02/19 1635   BP: 115/74   BP Cuff Size: Regular   BP Site: Right Arm   BP Position: Sitting   Pulse: 88   Temp: 97.9 F (36.6 C)   TempSrc: Temporal   Weight: 143 lb 12.8 oz (65.2 kg)     Gen: well appearing, nad, ambulates  without assistance   Cardiovascular: regular rate, 2+ peripheral pulses  Respiratory: lungs good respiratory effort, no use of accessory muscles.  Gastrointestinal: soft, gravid, nontender  Neurological: alert and oriented x 3  Psychiatric: bright and reactive affect.    FHT: 135, confirmed on bedside US         Assessment: 28 year old G45P0010 F at 10+5 WGA by 6 week [place] here for prenatal care.    # OB   -IUP Confirmed 05/02/19  -Prenatal labs wnl.   -Continue prenatal vitamins. Hx of chicken pox in past.   -Pre-pregnancy BMI: 24.88, recommended 25-35 pounds of weight gain this pregnancy   -TWG -1 lb 3.2 oz (-0.544 kg)    # FWB: Fetus with dating by 6 wk ultrasound.     # Prenatal Screening for aneuploidy and NTD explained. Discussed  options for prenatal diagnosis including both non-invasive and invasive screening.   -Maternal age related aneuploidy risk: low   -Patient would like: Unsure, info for cfDNA given. She will send Ecare with final decision   -Patient will call insurance re SMA and CF testing.  -Anatomy Scan @ 20wks     # Vaccines: Influenza- unsure if she would like. Counseled on strong recommendation in pregnancy, plan for Tdap between 27-36 weeks.    # BCM: Will discuss in 72rd T.     Return to clinic 2 weeks.  At Next Visit: NT [place]     - <28wga = q4week visits                          - 11-14wga: NT [place] with PAPP-A if integrated screen                          - 16-18wga: Quad screen (16-22wga) or isolated AFP if cfDNA done                          - 18-20wga: Anatomy US                          - 26-28wga: Glucola, repeat Hct, Rhogam if Rh-, HIV/RPR if high risk              - 28-36wga = q2week visits                          - 28-32wga: TDaP                          - 35-37wga: GBS swab, repeat GC/CT if indicated, [place] for position                          - 36wga: weekly NSTs for AMA              -37-41wga = weekly visits                          - schedule IOL for 41 weeks if not delivered

## 2019-06-06 ENCOUNTER — Encounter (HOSPITAL_BASED_OUTPATIENT_CLINIC_OR_DEPARTMENT_OTHER): Payer: Self-pay | Admitting: Unknown Physician Specialty

## 2019-06-06 NOTE — Progress Notes (Signed)
I saw and evaluated the patient with the resident. I have reviewed the resident's documentation and have made appropriate edits to the documentation that reflect the plan of care and medical decision making.    Micaiah Litle, MD  Attending Physician, OB/GYN

## 2019-06-07 NOTE — Telephone Encounter (Signed)
Ecare sent to pt stating that the CF and SMA testing is recommended regardless of your age and that pt would do either cfDNA or integrated screen, not both. Advised to ecare any further questions PRN.

## 2019-06-09 ENCOUNTER — Other Ambulatory Visit (HOSPITAL_BASED_OUTPATIENT_CLINIC_OR_DEPARTMENT_OTHER): Payer: Self-pay

## 2019-06-09 ENCOUNTER — Ambulatory Visit
Admit: 2019-06-09 | Discharge: 2019-06-09 | Disposition: A | Discharge status: Home / Self Care | Payer: PPO | Attending: Obstetrics & Gynecology | Admitting: Obstetrics & Gynecology

## 2019-06-09 ENCOUNTER — Other Ambulatory Visit (HOSPITAL_BASED_OUTPATIENT_CLINIC_OR_DEPARTMENT_OTHER): Payer: Self-pay | Admitting: Unknown Physician Specialty

## 2019-06-09 DIAGNOSIS — Z3481 Encounter for supervision of other normal pregnancy, first trimester: Secondary | ICD-10-CM | POA: Insufficient documentation

## 2019-06-09 DIAGNOSIS — Z3A11 11 weeks gestation of pregnancy: Secondary | ICD-10-CM | POA: Insufficient documentation

## 2019-06-09 LAB — LAB UNDEFINED ORCA/EPIC ORDER

## 2019-06-09 NOTE — Addendum Note (Signed)
Addended by: Evelena Asa on: 06/09/2019 01:56 PM     Modules accepted: Orders

## 2019-06-15 NOTE — Progress Notes (Signed)
NT[facility]    RETURN OB VISIT  ID/CC: 28 year old G56P0010 female at [redacted]w[redacted]d by 6 week [place] who presents for a return OB visit.    HPP: Pregnancy complicated by problem list below.     - Patient doing well   - She denies VB, LOF or cramping  - No N/V    PRIMARY OB: Jaleea Alesi    REVIEW OF DATES  LMP  03/12/19   --> EDD 12/17/19  -->  EGA 7+2 WGA  Ultrasound  05/02/19 --->  EDD 12/24/19  --> EGA  6+2 WGA  Authoritative EDD: 12/24/19     Problem List   # Spotting in 1st trimester, possible subchorionic hemorrhage     Prenatal Labs  Blood Type: A+ Antibody: neg  Hct: 40  Rubella immune, Syphilis NR, HBsAg NR, HCV NR HIV NR  Pap 09/2016 NILM, GCCT neg, UCx 1K-10K mixed flora  Glucola _, Hct _   GBS _    Chicken Pox: Hx as child    Prenatal Screening  Cell free DNA: Low risk, XY  CF screen:  Pending  SMA screen: Pending     Prenatal Ultrasound(s)  [place] (_) @ _: _     Immunizations  Influenza: declines  TdAP: _  RhoGAM: _     Birth Plan  Breastfeeding: (date discussed)  Contraception:   Peds:    Past OB History    1. Early TOP   2. Current      Past GYN History   Menarche age: 77   Menses: Regular Q28 days prior to pregnancy   History of STIs: hx of chlamydia s/p tx   Last pap: 09/2016 NILM  Pap history: no history of abnormal paps  Coitarche age: Not asked today  Gyn surgeries: None       Patient Active Problem List    Diagnosis Date Noted   . Bleeding in early pregnancy [O20.9] 05/02/2019       Medications:    Current Outpatient Medications:   .  Prenatal Vit-Fe Fumarate-FA (prenatal mutlivitamin iron-folic acid) 29-1 MG tablet, Take 1 tablet by mouth daily., Disp: 90 tablet, Rfl: 6    Allergies:  Review of patient's allergies indicates:  No Known Allergies    ROS:     Constitutional: Negative    Gastrointestinal: Negative   Genitourinary: Negative    Physical Exam:   Vitals:    06/16/19 1418   BP: 109/70   BP Cuff Size: Regular   BP Site: Right Arm   BP Position: Sitting   Pulse: 74   Weight: 144 lb 6.4 oz (65.5 kg)     Gen: well appearing,  nad, ambulates without assistance   Cardiovascular: regular rate, 2+ peripheral pulses  Respiratory: lungs good respiratory effort, no use of accessory muscles.  Gastrointestinal: soft, gravid, nontender  Neurological: alert and oriented x 3  Psychiatric: bright and reactive affect.    FHR: Normal cardiac activity on bedside US, good ftal movement        Assessment: 28 year old G37P0010 F at [redacted]w[redacted]d WGA by 6 week [place] here for prenatal care.    # OB   -IUP Confirmed 05/02/19  -Prenatal labs wnl.   -Continue prenatal vitamins. Hx of chicken pox in past.   -Pre-pregnancy BMI: 24.88, recommended 25-35 pounds of weight gain this pregnancy   -TWG -9.6 oz (-0.272 kg) <- will discuss at next visit, patient denies n/v    # FWB: Fetus with dating by 6 wk  ultrasound.     # Prenatal Screening for aneuploidy and NTD explained. Discussed options for prenatal diagnosis including both non-invasive and invasive screening.   -Maternal age related aneuploidy risk: low   -Patient elects for cell free DNA- low risk, XY  -CF/SMA pending   -Anatomy Scan @ 20wks     # Vaccines: Influenza- declines. Counseled on strong recommendation in pregnancy, plan for Tdap between 27-36 weeks.    # BCM: Will discuss in 10rd T.     Return to clinic 4 weeks  At Next Visit: AFP    - <28wga = q4week visits                          - 11-14wga: NT [place] with PAPP-A if integrated screen                          - 16-18wga: Quad screen (16-22wga) or isolated AFP if cfDNA done                          - 18-20wga: Anatomy US                          - 26-28wga: Glucola, repeat Hct, Rhogam if Rh-, HIV/RPR if high risk              - 28-36wga = q2week visits                          - 28-32wga: TDaP                          - 35-37wga: GBS swab, repeat GC/CT if indicated, [place] for position                          - 36wga: weekly NSTs for AMA              -37-41wga = weekly visits                          - schedule IOL for 41 weeks if not delivered

## 2019-06-16 ENCOUNTER — Ambulatory Visit: Payer: PPO | Attending: Obstetrics & Gynecology

## 2019-06-16 ENCOUNTER — Ambulatory Visit (HOSPITAL_BASED_OUTPATIENT_CLINIC_OR_DEPARTMENT_OTHER): Payer: PPO | Admitting: Unknown Physician Specialty

## 2019-06-16 VITALS — BP 109/70 | HR 74 | Wt 144.4 lb

## 2019-06-16 DIAGNOSIS — Z3491 Encounter for supervision of normal pregnancy, unspecified, first trimester: Secondary | ICD-10-CM | POA: Insufficient documentation

## 2019-06-16 DIAGNOSIS — Z3A01 Less than 8 weeks gestation of pregnancy: Secondary | ICD-10-CM

## 2019-06-16 DIAGNOSIS — Z3A13 13 weeks gestation of pregnancy: Secondary | ICD-10-CM | POA: Insufficient documentation

## 2019-06-16 DIAGNOSIS — Z3A12 12 weeks gestation of pregnancy: Secondary | ICD-10-CM

## 2019-06-16 DIAGNOSIS — O26851 Spotting complicating pregnancy, first trimester: Secondary | ICD-10-CM

## 2019-06-16 LAB — CELL-FREE DNA PRENATAL SCREEN
cfDNA Predicted Fetal Fraction: 6 %
cfDNA Result: NEGATIVE

## 2019-06-17 LAB — CYSTIC FIBROSIS DNA SCREEN: Cystic Fibrosis Results: NEGATIVE

## 2019-06-17 NOTE — Progress Notes (Signed)
I saw and evaluated the patient. I have reviewed the resident's documentation and agree with it.

## 2019-06-24 LAB — GENETICS SENDOUT TEST

## 2019-07-10 NOTE — Progress Notes (Deleted)
[facility]P    RETURN OB VISIT  ID/CC: 28 year old G62P0010 female at [redacted]w[redacted]d by 6 week [place] who presents for a return OB visit.    HPP: Pregnancy complicated by problem list below.         PRIMARY OB: Sonya Martin    REVIEW OF DATES  LMP  03/12/19   --> EDD 12/17/19  -->  EGA 7+2 WGA  Ultrasound  05/02/19 --->  EDD 12/24/19  --> EGA  6+2 WGA  Authoritative EDD: 12/24/19     Problem List   # Spotting in 1st trimester, possible subchorionic hemorrhage     Prenatal Labs  Blood Type: A+ Antibody: neg  Hct: 40  Rubella immune, Syphilis NR, HBsAg NR, HCV NR HIV NR  Pap 09/2016 NILM, GCCT neg, UCx 1K-10K mixed flora  Glucola _, Hct _   GBS _    Chicken Pox: Hx as child    Prenatal Screening  Cell free DNA: Low risk, XY  CF screen:  Pending  SMA screen: Pending     Prenatal Ultrasound(s)  [place] (_) @ _: _     Immunizations  Influenza: declines  TdAP: _  RhoGAM: _     Birth Plan  Breastfeeding: (date discussed)  Contraception:   Peds:    Past OB History    1. Early TOP   2. Current      Past GYN History   Menarche age: 89   Menses: Regular Q28 days prior to pregnancy   History of STIs: hx of chlamydia s/p tx   Last pap: 09/2016 NILM  Pap history: no history of abnormal paps  Coitar[place] age: Not asked today  Gyn surgeries: None       Patient Active Problem List    Diagnosis Date Noted   . Bleeding in early pregnancy [O20.9] 05/02/2019       Medications:    Current Outpatient Medications:   .  Prenatal Vit-Fe Fumarate-FA (prenatal mutlivitamin iron-folic acid) 40-3 MG tablet, Take 1 tablet by mouth daily., Disp: 90 tablet, Rfl: 6    Allergies:  Review of patient's allergies indicates:  No Known Allergies    ROS:     Constitutional: Negative    Gastrointestinal: Negative   Genitourinary: Negative    Physical Exam:   There were no vitals filed for this visit.  Gen: well appearing, nad, ambulates without assistance   Cardiovascular: regular rate, 2+ peripheral pulses  Respiratory: lungs good respiratory effort, no use of accessory  muscles.  Gastrointestinal: soft, gravid, nontender  Neurological: alert and oriented x 3  Psychiatric: bright and reactive affect.    FHR: Normal cardiac activity on bedside US, good ftal movement        Assessment: 28 year old G68P0010 F at [redacted]w[redacted]d WGA by 6 week [place] here for prenatal care.    # OB   -IUP Confirmed 05/02/19  -Prenatal labs wnl.   -Continue prenatal vitamins. Hx of chicken pox in past.   -Pre-pregnancy BMI: 24.88, recommended 25-35 pounds of weight gain this pregnancy   -TWG -9.6 oz (-0.272 kg) <- will discuss at next visit, patient denies n/v    # FWB: Fetus with dating by 6 wk ultrasound.     # Prenatal Screening for aneuploidy and NTD explained. Discussed options for prenatal diagnosis including both non-invasive and invasive screening.   -Maternal age related aneuploidy risk: low   -Patient elects for cell free DNA- low risk, XY  -CF/SMA pending   -Anatomy Scan @  20wks     # Vaccines: Influenza- declines. Counseled on strong recommendation in pregnancy, plan for Tdap between 27-36 weeks.    # BCM: Will discuss in 3rd T.     Return to clinic 4 weeks  At Next Visit: AFP    - <28wga = q4week visits                          - 11-14wga: NT [place] with PAPP-A if integrated screen                          - 16-18wga: Quad screen (16-22wga) or isolated AFP if cfDNA done                          - 18-20wga: Anatomy US                          - 26-28wga: Glucola, repeat Hct, Rhogam if Rh-, HIV/RPR if high risk              - 28-36wga = q2week visits                          - 28-32wga: TDaP                          - 35-37wga: GBS swab, repeat GC/CT if indicated, [place] for position                          - 36wga: weekly NSTs for AMA              -37-41wga = weekly visits                          - schedule IOL for 41 weeks if not delivered

## 2019-07-11 ENCOUNTER — Encounter (HOSPITAL_BASED_OUTPATIENT_CLINIC_OR_DEPARTMENT_OTHER): Payer: Self-pay | Admitting: Unknown Physician Specialty

## 2019-07-11 ENCOUNTER — Ambulatory Visit: Payer: PPO | Attending: Obstetrics & Gynecology | Admitting: Unknown Physician Specialty

## 2019-07-11 ENCOUNTER — Encounter (HOSPITAL_BASED_OUTPATIENT_CLINIC_OR_DEPARTMENT_OTHER): Payer: PPO | Admitting: Unknown Physician Specialty

## 2019-07-11 VITALS — BP 111/69 | HR 76 | Temp 98.4°F | Ht 64.0 in | Wt 151.0 lb

## 2019-07-11 DIAGNOSIS — Z3A16 16 weeks gestation of pregnancy: Secondary | ICD-10-CM | POA: Insufficient documentation

## 2019-07-11 DIAGNOSIS — Z3402 Encounter for supervision of normal first pregnancy, second trimester: Secondary | ICD-10-CM

## 2019-07-11 NOTE — Progress Notes (Signed)
RETURN OB VISIT  ID/CC: 28 year old G11P0010 female at [redacted]w[redacted]d by 6 week [place] who presents for a return OB visit.    HPP: Pregnancy complicated by problem list below.     - Patient reports she is overall doing well  - Sometimes she has some vague abdominal distention and cramps but nothing severe or persistent   - Denies discharge or bleeding      PRIMARY OB: Sonya Martin    REVIEW OF DATES  LMP  03/12/19   --> EDD 12/17/19  -->  EGA 7+2 WGA  Ultrasound  05/02/19 --->  EDD 12/24/19  --> EGA  6+2 WGA  Authoritative EDD: 12/24/19     Problem List   # Spotting in 1st trimester, possible subchorionic hemorrhage     Prenatal Labs  Blood Type: A+ Antibody: neg  Hct: 40  Rubella immune, Syphilis NR, HBsAg NR, HCV NR HIV NR  Pap 09/2016 NILM, GCCT neg, UCx 1K-10K mixed flora  Glucola _, Hct _   GBS _    Chicken Pox: Hx as child    Prenatal Screening  Cell free DNA: Low risk, XY  CF screen:  Pending  SMA screen: Pending     Prenatal Ultrasound(s)  [place] (_) @ _: _     Immunizations  Influenza: declines  TdAP: _  RhoGAM: _     Birth Plan  Breastfeeding: (date discussed)  Contraception:   Peds:    Past OB History    1. Early TOP   2. Current      Past GYN History   Menarche age: 27   Menses: Regular Q28 days prior to pregnancy   History of STIs: hx of chlamydia s/p tx   Last pap: 09/2016 NILM  Pap history: no history of abnormal paps  Coitar[place] age: Not asked today  Gyn surgeries: None       Patient Active Problem List    Diagnosis Date Noted   . Bleeding in early pregnancy [O20.9] 05/02/2019       Medications:    Current Outpatient Medications:   .  Prenatal Vit-Fe Fumarate-FA (prenatal mutlivitamin iron-folic acid) 64-4 MG tablet, Take 1 tablet by mouth daily., Disp: 90 tablet, Rfl: 6    Allergies:  Review of patient's allergies indicates:  No Known Allergies    ROS:     Constitutional: Negative    Gastrointestinal: Negative   Genitourinary: Negative    Physical Exam:   Vitals:    07/11/19 1412   BP: 111/69   BP Cuff Size: Regular   BP Site:  Right Arm   BP Position: Sitting   Pulse: 76   Temp: 98.4 F (36.9 C)   TempSrc: Temporal   Weight: 151 lb (68.5 kg)   Height: 5\' 4"  (1.626 m)     Gen: well appearing, nad, ambulates without assistance   Cardiovascular: regular rate, 2+ peripheral pulses  Respiratory: lungs good respiratory effort, no use of accessory muscles.  Gastrointestinal: soft, gravid, nontender  Neurological: alert and oriented x 3  Psychiatric: bright and reactive affect.    FHR: see notes        Assessment: 28 year old G34P0010 F at [redacted]w[redacted]d WGA by 6 week [place] here for prenatal care.    # OB   -IUP Confirmed 05/02/19  -Prenatal labs wnl.   -Continue prenatal vitamins. Hx of chicken pox in past.   -Pre-pregnancy BMI: 24.88, recommended 25-35 pounds of weight gain this pregnancy   -TWG 6 lb (2.722  kg)   - Discussed IOL vs spont labor     # FWB: Fetus with dating by 6 wk ultrasound.     # Prenatal Screening for aneuploidy and NTD explained. Discussed options for prenatal diagnosis including both non-invasive and invasive screening.   -Maternal age related aneuploidy risk: low   -Patient elects for cell free DNA- low risk, XY  -CF/SMA neg screen  -Anatomy Scan @ 20wks ordered    # Vaccines: Influenza- declines. Counseled on strong recommendation in pregnancy, plan for Tdap between 27-36 weeks.    # BCM: Will discuss in 3rd T.     Return to clinic 4 weeks  At Next Visit: Anatomy US     - <28wga = q4week visits                          - 11-14wga: NT [place] with PAPP-A if integrated screen                          - 16-18wga: Quad screen (16-22wga) or isolated AFP if cfDNA done                          - 18-20wga: Anatomy US                          - 26-28wga: Glucola, repeat Hct, Rhogam if Rh-, HIV/RPR if high risk              - 28-36wga = q2week visits                          - 28-32wga: TDaP                          - 35-37wga: GBS swab, repeat GC/CT if indicated, [place] for position                          - 36wga: weekly NSTs for AMA               -37-41wga = weekly visits                          - schedule IOL for 41 weeks if not delivered

## 2019-07-12 LAB — PRENATAL RISK, MATERNAL AFP
.: 0
Maternal Age At Term (Edd): 30.8 a
PR AFP Mom: 1.42
PR Gestational Age: 16.3 weeks
PR Maternal Weight: 151 [lb_av]
PR Number Of Fetuses: 1
PR Panel Interpretation: NEGATIVE
PR Risk Of Ontd: 1

## 2019-07-13 ENCOUNTER — Other Ambulatory Visit: Payer: Self-pay

## 2019-07-13 DIAGNOSIS — Z20822 Contact with and (suspected) exposure to covid-19: Secondary | ICD-10-CM

## 2019-07-14 LAB — NOVEL CORONAVIRUS, NAA: SARS-CoV-2, NAA: NOT DETECTED

## 2019-07-14 NOTE — Progress Notes (Signed)
I have personally discussed the case with the resident during or immediately after the patient visit including review of history, physical exam, diagnosis, and treatment plan. Any additional comments are below or have been added to the above note.

## 2019-07-18 ENCOUNTER — Encounter (HOSPITAL_BASED_OUTPATIENT_CLINIC_OR_DEPARTMENT_OTHER): Payer: Self-pay

## 2019-08-08 ENCOUNTER — Ambulatory Visit (HOSPITAL_BASED_OUTPATIENT_CLINIC_OR_DEPARTMENT_OTHER): Payer: PPO

## 2019-08-08 ENCOUNTER — Ambulatory Visit: Payer: PPO | Attending: Obstetrics & Gynecology | Admitting: Unknown Physician Specialty

## 2019-08-08 VITALS — BP 113/67 | HR 81 | Temp 98.7°F | Wt 160.8 lb

## 2019-08-08 DIAGNOSIS — Z3A21 21 weeks gestation of pregnancy: Secondary | ICD-10-CM | POA: Insufficient documentation

## 2019-08-08 DIAGNOSIS — Z3A20 20 weeks gestation of pregnancy: Secondary | ICD-10-CM

## 2019-08-08 DIAGNOSIS — Z3482 Encounter for supervision of other normal pregnancy, second trimester: Secondary | ICD-10-CM

## 2019-08-08 DIAGNOSIS — Z3490 Encounter for supervision of normal pregnancy, unspecified, unspecified trimester: Secondary | ICD-10-CM

## 2019-08-08 DIAGNOSIS — Z3A16 16 weeks gestation of pregnancy: Secondary | ICD-10-CM

## 2019-08-08 NOTE — Progress Notes (Signed)
RETURN OB VISIT  ID/CC: 28 year old G34P0010 female at 102w2d by 6 week [place] who presents for a return OB visit.    HPP: Pregnancy complicated by problem list below.     - Intermittent back pain   - Denies bleeding or LOF   - Now feeling small fetal movements     PRIMARY OB: Sonya Martin    REVIEW OF DATES  LMP  03/12/19   --> EDD 12/17/19  -->  EGA 7+2 WGA  Ultrasound  05/02/19 --->  EDD 12/24/19  --> EGA  6+2 WGA  Authoritative EDD: 12/24/19     Problem List   # Spotting in 1st trimester, possible subchorionic hemorrhage     Prenatal Labs  Blood Type: A+ Antibody: neg  Hct: 40  Rubella immune, Syphilis NR, HBsAg NR, HCV NR HIV NR  Pap 09/2016 NILM, GCCT neg, UCx 1K-10K mixed flora  Glucola _, Hct _   GBS _    Chicken Pox: Hx as child    Prenatal Screening  Cell free DNA: Low risk, XY  CF screen:  Pending  SMA screen: Pending     Prenatal Ultrasound(s)  [place] (08/08/19) @ 20+2 WGA:   CL 50.27mm  21+5/21+2/21+2/22+1/21+2/22+1  EFW 449 69%  Normal anatomy         Immunizations  Influenza: declines  TdAP: _  RhoGAM: _     Birth Plan  Breastfeeding: (date discussed)  Contraception:   Peds:    Past OB History    1. Early TOP   2. Current      Past GYN History   Menarche age: 28   Menses: Regular Q28 days prior to pregnancy   History of STIs: hx of chlamydia s/p tx   Last pap: 09/2016 NILM  Pap history: no history of abnormal paps  Coitarche age: Not asked today  Gyn surgeries: None       Patient Active Problem List    Diagnosis Date Noted   . Bleeding in early pregnancy [O20.9] 05/02/2019       Medications:    Current Outpatient Medications:   .  Prenatal Vit-Fe Fumarate-FA (prenatal mutlivitamin iron-folic acid) 46-5 MG tablet, Take 1 tablet by mouth daily., Disp: 90 tablet, Rfl: 6    Allergies:  Review of patient's allergies indicates:  No Known Allergies    ROS:     Constitutional: Negative    Gastrointestinal: Negative   Genitourinary: Negative    Physical Exam:   Vitals:    08/08/19 1346   BP: 113/67   BP Cuff Size: Regular   BP Site:  Right Arm   BP Position: Sitting   Pulse: 81   Temp: 98.7 F (37.1 C)   TempSrc: Temporal   Weight: 160 lb 12.8 oz (72.9 kg)     Gen: well appearing, nad, ambulates without assistance   Cardiovascular: regular rate, 2+ peripheral pulses  Respiratory: lungs good respiratory effort, no use of accessory muscles.  Gastrointestinal: soft, gravid, nontender  Neurological: alert and oriented x 3  Psychiatric: bright and reactive affect.    FHR: see notes      Assessment: 28 year old G31P0010 F at [redacted]w[redacted]d WGA by 6 week [place] here for prenatal care.    # OB   -IUP Confirmed 05/02/19  -Prenatal labs wnl.   -Continue prenatal vitamins. Hx of chicken pox in past.   -Pre-pregnancy BMI: 24.88, recommended 25-35 pounds of weight gain this pregnancy   -TWG 15 lb 12.8 oz (7.167 kg)   -  Discussed IOL vs spont labor   - Patient plans to see chiropractor for back pain. Discussed PT options and support band.     # FWB: Fetus with dating by 6 wk ultrasound.     # Prenatal Screening for aneuploidy and NTD explained. Discussed options for prenatal diagnosis including both non-invasive and invasive screening.   -Maternal age related aneuploidy risk: low   -Patient elects for cell free DNA- low risk, XY  -MsAFP neg  -CF/SMA neg screen  -Anatomy Scan normal and complete    # Vaccines: Influenza- declines. Counseled on strong recommendation in pregnancy, plan for Tdap between 27-36 weeks.    # BCM: Will discuss in 3rd T.     Return to clinic 4 weeks  At Next Visit: Sx check in    - <28wga = q4week visits                          - 11-14wga: NT [place] with PAPP-A if integrated screen                          - 16-18wga: Quad screen (16-22wga) or isolated AFP if cfDNA done                          - 18-20wga: Anatomy US                          - 26-28wga: Glucola, repeat Hct, Rhogam if Rh-, HIV/RPR if high risk              - 28-36wga = q2week visits                          - 28-32wga: TDaP                          - 35-37wga: GBS swab, repeat GC/CT if  indicated, [place] for position                          - 36wga: weekly NSTs for AMA              -37-41wga = weekly visits                          - schedule IOL for 41 weeks if not delivered

## 2019-08-14 NOTE — Progress Notes (Signed)
Attending attestation     I did not see the patient, but have reviewed the findings. Doing well at 20 weeks. I have reviewed the resident's documentation and agree with it.    Modesto Charon, MD

## 2019-09-07 DIAGNOSIS — Z3490 Encounter for supervision of normal pregnancy, unspecified, unspecified trimester: Secondary | ICD-10-CM | POA: Insufficient documentation

## 2019-09-07 NOTE — Progress Notes (Signed)
RETURN OB VISIT  ID/CC: 28 year old G50P0010 female at [redacted]w[redacted]d by 6 week [place] who presents for a return OB visit.    HPP: Pregnancy complicated by problem list below.     Feels well today. Reports this pregnancy has been "easy" thus far. Is planning a quiet New Years.    Reports some fetal movement. Denies VB, LOF, or contractions. Has questions about Glucola.    PRIMARY OB: [name]    REVIEW OF DATES  LMP  03/12/19   --> EDD 12/17/19  -->  EGA 7+2 WGA  Ultrasound  05/02/19 --->  EDD 12/24/19  --> EGA  6+2 WGA  Authoritative EDD: 12/24/19     Problem List   # Spotting in 1st trimester, possible subchorionic hemorrhage     Prenatal Labs  Blood Type: A+ Antibody: neg  Hct: 40  Rubella immune, Syphilis NR, HBsAg NR, HCV NR HIV NR  Pap 09/2016 NILM, GCCT neg, UCx 1K-10K mixed flora  Glucola _, Hct _   GBS _    Chicken Pox: Hx as child    Prenatal Screening  Cell free DNA: Low risk, XY  CF screen:  negative  SMA screen: low risk    Prenatal Ultrasound(s)  [place] (08/08/19) @ 20+2 WGA:   CL 50.45mm  21+5/21+2/21+2/22+1/21+2/22+1  EFW 449 69%  Normal anatomy         Immunizations  Influenza: declines  TdAP: _  RhoGAM: _     Birth Plan  Breastfeeding: (date discussed)  Contraception:   Peds:    Past OB History    1. Early TOP   2. Current      Past GYN History   Menarche age: 51   Menses: Regular Q28 days prior to pregnancy   History of STIs: hx of chlamydia s/p tx   Last pap: 09/2016 NILM  Pap history: no history of abnormal paps  Coitarche age: Not asked today  Gyn surgeries: None       Patient Active Problem List    Diagnosis Date Noted   . Pregnancy [Z34.90] 09/07/2019   . Bleeding in early pregnancy [O20.9] 05/02/2019       Medications:    Current Outpatient Medications:   .  Prenatal Vit-Fe Fumarate-FA (prenatal mutlivitamin iron-folic acid) 60-4 MG tablet, Take 1 tablet by mouth daily., Disp: 90 tablet, Rfl: 6    Allergies:  Review of patient's allergies indicates:  No Known Allergies    ROS:     Constitutional: Negative     Gastrointestinal: Negative   Genitourinary: Negative    Physical Exam:   Vitals:    09/08/19 1309   BP: 125/75   BP Cuff Size: Regular   BP Site: Right Arm   BP Position: Sitting   Pulse: 89   Temp: 97.4 F (36.3 C)   TempSrc: Temporal   Weight: 174 lb 3.2 oz (79 kg)     Gen: well appearing, nad, ambulates without assistance   Cardiovascular: regular rate, 2+ peripheral pulses  Respiratory: lungs good respiratory effort, no use of accessory muscles.  Gastrointestinal: soft, gravid, nontender  Neurological: alert and oriented x 3  Psychiatric: bright and reactive affect.    FHR: see notes      Assessment: 28 year old G40P0010 F at [redacted]w[redacted]d  by 6 week [place] here for prenatal care.    # OB   -IUP Confirmed 05/02/19  -Prenatal labs wnl. Glucola ordered 12/31 and supplies provided.  -Continue prenatal vitamins. Hx of chicken pox  in past.   -Pre-pregnancy BMI: 24.88, recommended 25-35 pounds of weight gain this pregnancy   -TWG 29 lb 3.2 oz (13.2 kg)   - Previously discussed IOL vs spont labor       # FWB: Fetus with dating by 6 wk ultrasound.     # Prenatal Screening for aneuploidy and NTD explained. Discussed options for prenatal diagnosis including both non-invasive and invasive screening.   -Maternal age related aneuploidy risk: low   -Patient elects for cell free DNA- low risk, XY  -MsAFP neg  -CF/SMA neg screen  -Anatomy Scan normal and complete    # Vaccines: Influenza- declines. Counseled on strong recommendation in pregnancy, plan for Tdap between 27-36 weeks.    # BCM: Will discuss in 3rd T.     Return to clinic 4 weeks  At Next Visit: Tdap    - <28wga = q4week visits                          - 11-14wga: NT [place] with PAPP-A if integrated screen                          - 16-18wga: Quad screen (16-22wga) or isolated AFP if cfDNA done                          - 18-20wga: Anatomy US                          - 26-28wga: Glucola, repeat Hct, Rhogam if Rh-, HIV/RPR if high risk              - 28-36wga = q2week visits                           - 28-32wga: TDaP                          - 35-37wga: GBS swab, repeat GC/CT if indicated, [place] for position                          - 36wga: weekly NSTs for AMA              -37-41wga = weekly visits                          - schedule IOL for 41 weeks if not delivered

## 2019-09-08 ENCOUNTER — Ambulatory Visit: Payer: PPO | Attending: Obstetrics & Gynecology | Admitting: Unknown Physician Specialty

## 2019-09-08 VITALS — BP 125/75 | HR 89 | Temp 97.4°F | Wt 174.2 lb

## 2019-09-08 DIAGNOSIS — Z3492 Encounter for supervision of normal pregnancy, unspecified, second trimester: Secondary | ICD-10-CM

## 2019-09-08 DIAGNOSIS — Z3A24 24 weeks gestation of pregnancy: Secondary | ICD-10-CM | POA: Insufficient documentation

## 2019-09-08 NOTE — Progress Notes (Signed)
I did not personally see the patient, but have reviewed the history, physical, and care plan with the Resident. I have reviewed the documentation and agree with it.     Krue Peterka, MD  Attending Physician, OB/GYN

## 2019-09-15 ENCOUNTER — Ambulatory Visit
Admit: 2019-09-15 | Discharge: 2019-09-15 | Disposition: A | Discharge status: Home / Self Care | Payer: PPO | Attending: Obstetrics & Gynecology | Admitting: Obstetrics & Gynecology

## 2019-09-15 DIAGNOSIS — Z3A24 24 weeks gestation of pregnancy: Secondary | ICD-10-CM | POA: Insufficient documentation

## 2019-09-15 LAB — GLUCOSE 1 HR. POST DOSE, OB: Glucose 1 Hr. Screen, OB: 130 mg/dL (ref 62–139)

## 2019-09-15 MED FILL — NYSTATIN 100,000 UNITS/GM O: 100000 | 30 days supply | Qty: 30 | Fill #0

## 2019-09-15 MED FILL — metFORMIN HCL ER 500 MG TB2: 500 | 30 days supply | Qty: 46 | Fill #0

## 2019-09-30 ENCOUNTER — Encounter (HOSPITAL_BASED_OUTPATIENT_CLINIC_OR_DEPARTMENT_OTHER): Payer: Self-pay

## 2019-10-03 ENCOUNTER — Ambulatory Visit: Payer: PPO | Attending: Obstetrics & Gynecology | Admitting: Unknown Physician Specialty

## 2019-10-03 VITALS — BP 128/82 | HR 99 | Wt 181.0 lb

## 2019-10-03 DIAGNOSIS — Z23 Encounter for immunization: Secondary | ICD-10-CM | POA: Insufficient documentation

## 2019-10-03 DIAGNOSIS — O99613 Diseases of the digestive system complicating pregnancy, third trimester: Secondary | ICD-10-CM

## 2019-10-03 DIAGNOSIS — Z3A28 28 weeks gestation of pregnancy: Secondary | ICD-10-CM | POA: Insufficient documentation

## 2019-10-03 DIAGNOSIS — K219 Gastro-esophageal reflux disease without esophagitis: Secondary | ICD-10-CM | POA: Insufficient documentation

## 2019-10-03 MED ORDER — OMEPRAZOLE 20 MG OR CPDR
20.0000 mg | DELAYED_RELEASE_CAPSULE | Freq: Every day | ORAL | 2 refills | Status: DC
Start: 2019-10-03 — End: 2019-12-14

## 2019-10-03 NOTE — Progress Notes (Signed)
RETURN OB VISIT  ID/CC: 29 year old G82P0010 female at [redacted]w[redacted]d by 6 week [place] who presents for a return OB visit.    HPP: Pregnancy complicated by problem list below.     Feels well today. No new concerns since last visit. Is amenable to Tdap today as well as HCT. Reports good fetal movement. Denies VB, LOF ,or contractions.    PRIMARY OB: [name]    REVIEW OF DATES  LMP  03/12/19   --> EDD 12/17/19  -->  EGA 7+2 WGA  Ultrasound  05/02/19 --->  EDD 12/24/19  --> EGA  6+2 WGA  Authoritative EDD: 12/24/19     Problem List   # Spotting in 1st trimester, possible subchorionic hemorrhage     Prenatal Labs  Blood Type: A+ Antibody: neg  Hct: 40  Rubella immune, Syphilis NR, HBsAg NR, HCV NR HIV NR  Pap 09/2016 NILM, GCCT neg, UCx 1K-10K mixed flora  Glucola 130, Hct _   GBS _    Chicken Pox: Hx as child    Prenatal Screening  Cell free DNA: Low risk, XY  CF screen:  negative  SMA screen: low risk    Prenatal Ultrasound(s)  [place] (08/08/19) @ 20+2 WGA:   CL 50.20mm  21+5/21+2/21+2/22+1/21+2/22+1  EFW 449 69%  Normal anatomy         Immunizations  Influenza: declines  TdAP: 10/02/29  RhoGAM: _     Birth Plan  Breastfeeding: (date discussed)  Contraception:   Peds:    Past OB History    1. Early TOP   2. Current      Past GYN History   Menarche age: 6   Menses: Regular Q28 days prior to pregnancy   History of STIs: hx of chlamydia s/p tx   Last pap: 09/2016 NILM  Pap history: no history of abnormal paps  Co[place] age: Not asked today  Gyn surgeries: None       Patient Active Problem List    Diagnosis Date Noted   . Pregnancy [Z34.90] 09/07/2019   . Bleeding in early pregnancy [O20.9] 05/02/2019       Medications:    Current Outpatient Medications:   .  Prenatal Vit-Fe Fumarate-FA (prenatal mutlivitamin iron-folic acid) 32-3 MG tablet, Take 1 tablet by mouth daily., Disp: 90 tablet, Rfl: 6    Allergies:  Review of patient's allergies indicates:  No Known Allergies    ROS:     Constitutional: Negative    Gastrointestinal: Negative    Genitourinary: Negative    Physical Exam:   There were no vitals filed for this visit.  Gen: well appearing, nad, ambulates without assistance   Cardiovascular: regular rate, 2+ peripheral pulses  Respiratory: lungs good respiratory effort, no use of accessory muscles.  Gastrointestinal: soft, gravid, nontender  Neurological: alert and oriented x 3  Psychiatric: bright and reactive affect.    FHR: see notes      Assessment: 29 year old G88P0010 F at [redacted]w[redacted]d  by 6 week [place] here for prenatal care.    # OB   -IUP Confirmed 05/02/19  -Prenatal labs wnl. Glucola ordered 12/31 and supplies provided.  -Continue prenatal vitamins. Hx of chicken pox in past.   -Pre-pregnancy BMI: 24.88, recommended 25-35 pounds of weight gain this pregnancy   -TWG 29 lb 3.2 oz (13.2 kg)   - Previously discussed IOL vs spont labor       # FWB: Fetus with dating by 6 wk ultrasound.     #  Prenatal Screening for aneuploidy and NTD explained. Discussed options for prenatal diagnosis including both non-invasive and invasive screening.   -Maternal age related aneuploidy risk: low   -Patient elects for cell free DNA- low risk, XY  -MsAFP neg  -CF/SMA neg screen  -Anatomy Scan normal and complete    # Vaccines: Influenza- declines again today. Counseled on strong recommendation in pregnancy. Tdap received 10/03/19.    # BCM: Will discuss in 3rd T.     Return to clinic 2 weeks  At Next Visit: f/u HCT    - <28wga = q4week visits                          - 11-14wga: NT [place] with PAPP-A if integrated screen                          - 16-18wga: Quad screen (16-22wga) or isolated AFP if cfDNA done                          - 18-20wga: Anatomy US                          - 26-28wga: Glucola, repeat Hct, Rhogam if Rh-, HIV/RPR if high risk              - 28-36wga = q2week visits                          - 28-32wga: TDaP                          - 35-37wga: GBS swab, repeat GC/CT if indicated, [place] for position                          - 36wga: weekly NSTs for AMA               -37-41wga = weekly visits                          - schedule IOL for 41 weeks if not delivered

## 2019-10-06 ENCOUNTER — Encounter (HOSPITAL_BASED_OUTPATIENT_CLINIC_OR_DEPARTMENT_OTHER): Payer: Self-pay | Admitting: Unknown Physician Specialty

## 2019-10-06 NOTE — Telephone Encounter (Signed)
Pt inquiring about labs   [facility] reply sent

## 2019-10-06 NOTE — Progress Notes (Signed)
Attending attestation     I saw and evaluated the patient with Dr. Fabienne Bruns.   Vitals:    10/03/19 1620   BP: 128/82   BP Cuff Size: Regular   BP Site: Right Arm   BP Position: Sitting   Pulse: 99   Weight: 181 lb (82.1 kg)     I have reviewed the resident's documentation and agree with it.    Kathlen Mody, MD

## 2019-10-17 ENCOUNTER — Encounter (HOSPITAL_BASED_OUTPATIENT_CLINIC_OR_DEPARTMENT_OTHER): Payer: Self-pay | Admitting: Unknown Physician Specialty

## 2019-10-17 ENCOUNTER — Encounter (HOSPITAL_BASED_OUTPATIENT_CLINIC_OR_DEPARTMENT_OTHER): Payer: PPO | Admitting: Unknown Physician Specialty

## 2019-10-17 ENCOUNTER — Ambulatory Visit: Payer: PPO | Attending: Obstetrics & Gynecology | Admitting: Unknown Physician Specialty

## 2019-10-17 VITALS — BP 113/74 | HR 77 | Temp 97.5°F | Wt 181.4 lb

## 2019-10-17 DIAGNOSIS — Z3A30 30 weeks gestation of pregnancy: Secondary | ICD-10-CM | POA: Insufficient documentation

## 2019-10-17 DIAGNOSIS — Z3A28 28 weeks gestation of pregnancy: Secondary | ICD-10-CM | POA: Insufficient documentation

## 2019-10-17 DIAGNOSIS — Z3493 Encounter for supervision of normal pregnancy, unspecified, third trimester: Secondary | ICD-10-CM

## 2019-10-17 LAB — CBC (HEMOGRAM)
Hematocrit: 34 % — ABNORMAL LOW (ref 36–45)
Hemoglobin: 11.2 g/dL — ABNORMAL LOW (ref 11.5–15.5)
MCH: 30.9 pg (ref 27.3–33.6)
MCHC: 32.9 g/dL (ref 32.2–36.5)
MCV: 94 fL (ref 81–98)
Platelet Count: 246 10*3/uL (ref 150–400)
RBC: 3.62 10*6/uL — ABNORMAL LOW (ref 3.80–5.00)
RDW-CV: 12.3 % (ref 11.6–14.4)
WBC: 12.24 10*3/uL — ABNORMAL HIGH (ref 4.3–10.0)

## 2019-10-17 NOTE — Progress Notes (Addendum)
RETURN OB VISIT  ID/CC: 29 year old G37P0010 female at [redacted]w[redacted]d by 6 week [place] who presents for a return OB visit.    HPP: Pregnancy complicated by problem list below.     HCT drawn today and pending. Feels well today. Has not yet tried doxylamine or melatonin for sleep but is thinking about trying it. Notes improvement in reflux symptoms since starting omeprazole.     Has questions about labor and delivery environment and what to expect. Is not interested in RR IOL but open to IOL at [facility] if needed.     Reports good fetal movement. Denies VB, LOF ,or contractions.    PRIMARY OB: [name]    REVIEW OF DATES  LMP  03/12/19   --> EDD 12/17/19  -->  EGA 7+2 WGA  Ultrasound  05/02/19 --->  EDD 12/24/19  --> EGA  6+2 WGA  Authoritative EDD: 12/24/19     Problem List   # Spotting in 1st trimester, possible subchorionic hemorrhage     Prenatal Labs  Blood Type: A+ Antibody: neg  Hct: 40  Rubella immune, Syphilis NR, HBsAg NR, HCV NR HIV NR  Pap 09/2016 NILM, GCCT neg, UCx 1K-10K mixed flora  Glucola 130, Hct 34  RPR: [  ]   GBS _  Chicken Pox: Hx as child    Prenatal Screening  Cell free DNA: Low risk, XY  CF screen:  negative  SMA screen: low risk    Prenatal Ultrasound(s)  [place] (08/08/19) @ 20+2 WGA:   CL 50.24mm  21+5/21+2/21+2/22+1/21+2/22+1  EFW 449 69%  Normal anatomy     Immunizations  Influenza: declines  TdAP: 10/03/19  RhoGAM: _ NA    Birth Plan  Breastfeeding:  Yes, discussed 10/17/19  Contraception: undecided, discussed 10/17/19  Peds: considering [facility], discussed 10/17/19    Past OB History    1. Early TOP   2. Current      Past GYN History   Menarche age: 19   Menses: Regular Q28 days prior to pregnancy   History of STIs: hx of chlamydia s/p tx   Last pap: 09/2016 NILM  Pap history: no history of abnormal paps  Co[place] age: Not asked today  Gyn surgeries: None       Patient Active Problem List    Diagnosis Date Noted   . Pregnancy [Z34.90] 09/07/2019   . Bleeding in early pregnancy [O20.9] 05/02/2019        Medications:    Current Outpatient Medications:   .  omeprazole 20 MG DR capsule, Take 1 capsule (20 mg) by mouth daily on an empty stomach., Disp: 30 capsule, Rfl: 2  .  Prenatal Vit-Fe Fumarate-FA (prenatal mutlivitamin iron-folic acid) 53-6 MG tablet, Take 1 tablet by mouth daily., Disp: 90 tablet, Rfl: 6    Allergies:  Review of patient's allergies indicates:  No Known Allergies    ROS:     Constitutional: Negative    Gastrointestinal: Negative   Genitourinary: Negative    Physical Exam:   Vitals:    10/17/19 1631   BP: 113/74   BP Cuff Size: Regular   BP Site: Right Arm   BP Position: Sitting   Pulse: 77   Temp: 97.5 F (36.4 C)   TempSrc: Temporal   Weight: 181 lb 6.4 oz (82.3 kg)     Gen: well appearing, nad, ambulates without assistance   Cardiovascular: regular rate, 2+ peripheral pulses  Respiratory: lungs good respiratory effort, no use of accessory muscles.  Gastrointestinal: soft, gravid, nontender  Neurological: alert and oriented x 3  Psychiatric: bright and reactive affect.    FHR: see notes      Assessment: 29 year old G29P0010 F at [redacted]w[redacted]d  by 6 week [place] here for prenatal care.    # OB   -IUP Confirmed 05/02/19  -Prenatal labs wnl.   -Continue prenatal vitamins. Hx of chicken pox in past.   -Pre-pregnancy BMI: 24.88, recommended 25-35 pounds of weight gain this pregnancy   -TWG 36 lb 6.4 oz (16.5 kg)   - Previously discussed IOL vs spont labor       # FWB: Fetus with dating by 6 wk ultrasound.     # Prenatal Screening for aneuploidy and NTD explained. Discussed options for prenatal diagnosis including both non-invasive and invasive screening.   -Maternal age related aneuploidy risk: low   -Patient elects for cell free DNA- low risk, XY  -MsAFP neg  -CF/SMA neg screen  -Anatomy Scan normal and complete    # Vaccines: Influenza- declined x2. Counseled on strong recommendation in pregnancy. Tdap received 10/03/19.    # BCM: undecided, to follow up    Return to clinic 2 weeks  At Next Visit: f/u HCT,  discuss BCM    - <28wga = q4week visits                          - 11-14wga: NT [place] with PAPP-A if integrated screen                          - 16-18wga: Quad screen (16-22wga) or isolated AFP if cfDNA done                          - 18-20wga: Anatomy US                          - 26-28wga: Glucola, repeat Hct, Rhogam if Rh-, HIV/RPR if high risk              - 28-36wga = q2week visits                          - 28-32wga: TDaP                          - 35-37wga: GBS swab, repeat GC/CT if indicated, [place] for position                          - 36wga: weekly NSTs for AMA              -37-41wga = weekly visits                          - schedule IOL for 41 weeks if not delivered

## 2019-10-17 NOTE — Telephone Encounter (Signed)
Pt inquiring about labs  Glucola was done, but CBC not done  Pt advised she can go to lab prior to her appointment to get this drawn as lab will be closed after

## 2019-10-21 NOTE — Progress Notes (Addendum)
I have personally discussed the case with the resident during or immediately after the patient visit including review of history, physical exam, diagnosis, and treatment plan.

## 2019-10-29 ENCOUNTER — Other Ambulatory Visit: Payer: Self-pay | Admitting: Obstetrics and Gynecology

## 2019-10-29 DIAGNOSIS — N971 Female infertility of tubal origin: Secondary | ICD-10-CM

## 2019-10-31 MED FILL — HYDROCODON-APAP 5-325: 5-325 | 3 days supply | Qty: 12 | Fill #0

## 2019-11-01 NOTE — Progress Notes (Signed)
PP BCM    RETURN OB VISIT  ID/CC: 29 year old G74P0010 female at [redacted]w[redacted]d by 6 week [place] who presents for a return OB visit.    HPP: Pregnancy complicated by problem list below.     Denies ctx, LOF or VB.     Has questions about labor and delivery environment and what to expect. Is not interested in RR IOL but open to IOL at 41wga if needed.    PRIMARY OB: Sonya Martin    REVIEW OF DATES  LMP  03/12/19   --> EDD 12/17/19  -->  EGA 7+2 WGA  Ultrasound  05/02/19 --->  EDD 12/24/19  --> EGA  6+2 WGA  Authoritative EDD: 12/24/19     Problem List   # Spotting in 1st trimester, possible subchorionic hemorrhage     Prenatal Labs  Blood Type: A+ Antibody: neg  Hct: 40  Rubella immune, Syphilis NR, HBsAg NR, HCV NR HIV NR  Pap 09/2016 NILM, GCCT neg, UCx 1K-10K mixed flora  Glucola 130, Hct 34  RPR: NR  GBS _  Chicken Pox: Hx as child    Prenatal Screening  Cell free DNA: Low risk, XY  CF screen:  negative  SMA screen: low risk    Prenatal Ultrasound(s)  [place] (08/08/19) @ 20+2 WGA:   CL 50.77mm  21+5/21+2/21+2/22+1/21+2/22+1  EFW 449 69%  Normal anatomy     Immunizations  Influenza: declines  TdAP: 10/03/19  RhoGAM: NA    Birth Plan  Breastfeeding:  Yes, discussed 10/17/19  Contraception: undecided, discussed 10/17/19  Peds: considering [facility], discussed 10/17/19    Past OB History    1. Early TOP   2. Current      Past GYN History   Menarche age: 66   Menses: Regular Q28 days prior to pregnancy   History of STIs: hx of chlamydia s/p tx   Last pap: 09/2016 NILM  Pap history: no history of abnormal paps  Co[place] age: Not asked today  Gyn surgeries: None       Patient Active Problem List    Diagnosis Date Noted   . Pregnancy [Z34.90] 09/07/2019   . Bleeding in early pregnancy [O20.9] 05/02/2019       Medications:    Current Outpatient Medications:   .  omeprazole 20 MG DR capsule, Take 1 capsule (20 mg) by mouth daily on an empty stomach., Disp: 30 capsule, Rfl: 2  .  Prenatal Vit-Fe Fumarate-FA (prenatal mutlivitamin iron-folic acid) 29-1 MG  tablet, Take 1 tablet by mouth daily., Disp: 90 tablet, Rfl: 6    Allergies:  Review of patient's allergies indicates:  No Known Allergies    ROS:     Constitutional: Negative    Gastrointestinal: Negative   Genitourinary: Negative    Physical Exam:   Vitals:    11/02/19 1624   BP: 127/80   BP Cuff Size: Regular   BP Site: Right Arm   BP Position: Sitting   Pulse: 89   Temp: 97.7 F (36.5 C)   TempSrc: Temporal   Weight: 187 lb (84.8 kg)     Gen: well appearing, nad, ambulates without assistance   Cardiovascular: regular rate, 2+ peripheral pulses  Respiratory: lungs good respiratory effort, no use of accessory muscles.  Gastrointestinal: soft, gravid, nontender  Neurological: alert and oriented x 3  Psychiatric: bright and reactive affect.    FHR: see notes      Assessment: 29 year old G48P0010 F at [redacted]w[redacted]d  by 6 week  [place] here for prenatal care.    # OB   -IUP Confirmed 05/02/19  -Prenatal labs wnl.   -Continue prenatal vitamins. Hx of chicken pox in past.   -Pre-pregnancy BMI: 24.88, recommended 25-35 pounds of weight gain this pregnancy   -TWG 42 lb (19.1 kg)   - Previously discussed IOL vs spont labor       # FWB: Fetus with dating by 6 wk ultrasound.     # Prenatal Screening for aneuploidy and NTD explained. Discussed options for prenatal diagnosis including both non-invasive and invasive screening.   -Maternal age related aneuploidy risk: low   -Patient elects for cell free DNA- low risk, XY  -MsAFP neg  -CF/SMA neg screen  -Anatomy Scan normal and complete    # Vaccines: Influenza- declined x2. Counseled on strong recommendation in pregnancy. Tdap received 10/03/19.    # BCM: condoms->6 week discussion (possibly IUD)    Return to clinic 2 weeks  At Next Visit: routine OB    - <28wga = q4week visits                          - 11-14wga: NT [place] with PAPP-A if integrated screen                          - 16-18wga: Quad screen (16-22wga) or isolated AFP if cfDNA done                          - 18-20wga: Anatomy US                           - 26-28wga: Glucola, repeat Hct, Rhogam if Rh-, HIV/RPR if high risk              - 28-36wga = q2week visits                          - 28-32wga: TDaP                          - 35-37wga: GBS swab, repeat GC/CT if indicated, [place] for position                          - 36wga: weekly NSTs for AMA              -37-41wga = weekly visits                          - schedule IOL for 41 weeks if not delivered

## 2019-11-02 ENCOUNTER — Encounter (HOSPITAL_BASED_OUTPATIENT_CLINIC_OR_DEPARTMENT_OTHER): Payer: PPO | Admitting: Unknown Physician Specialty

## 2019-11-02 ENCOUNTER — Ambulatory Visit: Payer: PPO | Attending: Obstetrics & Gynecology | Admitting: Unknown Physician Specialty

## 2019-11-02 ENCOUNTER — Encounter (HOSPITAL_BASED_OUTPATIENT_CLINIC_OR_DEPARTMENT_OTHER): Payer: Self-pay | Admitting: Unknown Physician Specialty

## 2019-11-02 VITALS — BP 127/80 | HR 89 | Temp 97.7°F | Wt 187.0 lb

## 2019-11-02 DIAGNOSIS — O26853 Spotting complicating pregnancy, third trimester: Secondary | ICD-10-CM

## 2019-11-02 DIAGNOSIS — Z3A32 32 weeks gestation of pregnancy: Secondary | ICD-10-CM | POA: Insufficient documentation

## 2019-11-04 NOTE — Progress Notes (Signed)
I did not personally see the patient, but have reviewed the history, physical, and care plan with the Resident. I have reviewed the documentation and agree with it.     Breda Bond, MD

## 2019-11-14 NOTE — Progress Notes (Signed)
Weight gain    RETURN OB VISIT  ID/CC: 29 year old G40P0010 female at [redacted]w[redacted]d by 6 week [place] who presents for a return OB visit.    HPP: Pregnancy complicated by problem list below.     Denies ctx, LOF or VB.         PRIMARY OB: [facility]land Daisey    REVIEW OF DATES  LMP  03/12/19   --> EDD 12/17/19  -->  EGA 7+2 WGA  Ultrasound  05/02/19 --->  EDD 12/24/19  --> EGA  6+2 WGA  Authoritative EDD: 12/24/19     Problem List   # Spotting in 1st trimester, possible subchorionic hemorrhage     Prenatal Labs  Blood Type: A+ Antibody: neg  Hct: 40  Rubella immune, Syphilis NR, HBsAg NR, HCV NR HIV NR  Pap 09/2016 NILM, GCCT neg, UCx 1K-10K mixed flora  Glucola 130, Hct 34  RPR: NR  GBS _  Chicken Pox: Hx as child    Prenatal Screening  Cell free DNA: Low risk, XY  CF screen:  negative  SMA screen: low risk    Prenatal Ultrasound(s)  [place] (08/08/19) @ 20+2 WGA:   CL 50.54mm  21+5/21+2/21+2/22+1/21+2/22+1  EFW 449 69%  Normal anatomy     Immunizations  Influenza: declines  TdAP: 10/03/19  RhoGAM: NA    Birth Plan  Breastfeeding:  Yes, discussed 10/17/19  Contraception: undecided, discussed 10/17/19  Peds: considering [facility], discussed 10/17/19    Past OB History    1. Early TOP   2. Current      Past GYN History   Menarche age: 42   Menses: Regular Q28 days prior to pregnancy   History of STIs: hx of chlamydia s/p tx   Last pap: 09/2016 NILM  Pap history: no history of abnormal paps  [place] age: Not asked today  Gyn surgeries: None       Patient Active Problem List    Diagnosis Date Noted   . Pregnancy [Z34.90] 09/07/2019   . Bleeding in early pregnancy [O20.9] 05/02/2019       Medications:    Current Outpatient Medications:   .  omeprazole 20 MG DR capsule, Take 1 capsule (20 mg) by mouth daily on an empty stomach., Disp: 30 capsule, Rfl: 2  .  Prenatal Vit-Fe Fumarate-FA (prenatal mutlivitamin iron-folic acid) 29-1 MG tablet, Take 1 tablet by mouth daily. (Patient not taking: Reported on 11/16/2019), Disp: 90 tablet, Rfl: 6    Allergies:  Review of  patient's allergies indicates:  No Known Allergies    ROS:     Constitutional: Negative    Gastrointestinal: Negative   Genitourinary: Negative    Physical Exam:   Vitals:    11/16/19 1616   BP: 129/80   BP Cuff Size: Regular   BP Site: Left Arm   BP Position: Sitting   Pulse: 82   Temp: 97.6 F (36.4 C)   Weight: 189 lb (85.7 kg)     Gen: well appearing, nad, ambulates without assistance   Cardiovascular: regular rate, 2+ peripheral pulses  Respiratory: lungs good respiratory effort, no use of accessory muscles.  Gastrointestinal: soft, gravid, nontender  Neurological: alert and oriented x 3  Psychiatric: bright and reactive affect.    FHR: see notes      Assessment: 29 year old G74P0010 F at [redacted]w[redacted]d  by 6 week [place] here for prenatal care.    # OB   -IUP Confirmed 05/02/19  -Prenatal labs wnl.   -Continue  prenatal vitamins. Hx of chicken pox in past.   -Pre-pregnancy BMI: 24.88, recommended 25-35 pounds of weight gain this pregnancy   -TWG 44 lb (20 kg) discussed 3/10  - Previously discussed IOL vs spont labor and declines rr IOL      # FWB: Fetus with dating by 6 wk ultrasound.     # Prenatal Screening for aneuploidy and NTD explained. Discussed options for prenatal diagnosis including both non-invasive and invasive screening.   -Maternal age related aneuploidy risk: low   -Patient elects for cell free DNA- low risk, XY  -MsAFP neg  -CF/SMA neg screen  -Anatomy Scan normal and complete    # Vaccines: Influenza- declined x2. Counseled on strong recommendation in pregnancy. Tdap received 10/03/19.    # BCM: condoms->6 week discussion (possibly IUD)    Return to clinic 2 weeks  At Next Visit: GBS    - <28wga = q4week visits                          - 11-14wga: NT [place] with PAPP-A if integrated screen                          - 16-18wga: Quad screen (16-22wga) or isolated AFP if cfDNA done                          - 18-20wga: Anatomy US                          - 26-28wga: Glucola, repeat Hct, Rhogam if Rh-, HIV/RPR if high  risk              - 28-36wga = q2week visits                          - 28-32wga: TDaP                          - 35-37wga: GBS swab, repeat GC/CT if indicated, [place] for position                          - 36wga: weekly NSTs for AMA              -37-41wga = weekly visits                          - schedule IOL for 41 weeks if not delivered

## 2019-11-16 ENCOUNTER — Ambulatory Visit: Payer: PPO | Attending: Obstetrics & Gynecology | Admitting: Unknown Physician Specialty

## 2019-11-16 ENCOUNTER — Encounter (HOSPITAL_BASED_OUTPATIENT_CLINIC_OR_DEPARTMENT_OTHER): Payer: PPO | Admitting: Unknown Physician Specialty

## 2019-11-16 VITALS — BP 129/80 | HR 82 | Temp 97.6°F | Wt 189.0 lb

## 2019-11-16 DIAGNOSIS — Z3493 Encounter for supervision of normal pregnancy, unspecified, third trimester: Secondary | ICD-10-CM

## 2019-11-16 DIAGNOSIS — Z3A34 34 weeks gestation of pregnancy: Secondary | ICD-10-CM | POA: Insufficient documentation

## 2019-11-20 NOTE — Progress Notes (Signed)
I did not personally see the patient, but have reviewed the history, physical, and care plan with the Resident. I have reviewed the documentation and agree with it.     Danasia Baker, MD

## 2019-11-23 ENCOUNTER — Telehealth (HOSPITAL_BASED_OUTPATIENT_CLINIC_OR_DEPARTMENT_OTHER): Payer: Self-pay | Admitting: Unknown Physician Specialty

## 2019-11-23 NOTE — Telephone Encounter (Signed)
Called pt to offer Covid Vaccine and pt has declined accepting the Covid Vaccination

## 2019-12-01 ENCOUNTER — Encounter (HOSPITAL_BASED_OUTPATIENT_CLINIC_OR_DEPARTMENT_OTHER): Payer: PPO | Admitting: Unknown Physician Specialty

## 2019-12-01 ENCOUNTER — Ambulatory Visit: Payer: PPO | Attending: Obstetrics & Gynecology | Admitting: Unknown Physician Specialty

## 2019-12-01 VITALS — BP 117/79 | HR 93 | Temp 97.9°F | Wt 194.3 lb

## 2019-12-01 DIAGNOSIS — Z3493 Encounter for supervision of normal pregnancy, unspecified, third trimester: Secondary | ICD-10-CM

## 2019-12-01 DIAGNOSIS — Z3A36 36 weeks gestation of pregnancy: Secondary | ICD-10-CM | POA: Insufficient documentation

## 2019-12-01 NOTE — Progress Notes (Signed)
RETURN OB VISIT    ID/CC: 29 year old G23P0010 female at [redacted]w[redacted]d by 6 week [place] who presents for a return OB visit.    HPP: Sonya Martin is doing well today. She declines any contractions, vaginal bleeding, or leakage of fluid. She is feeling baby move. She has no questions or concerns.    PRIMARY OB: [name]    REVIEW OF DATES  LMP  03/12/19   --> EDD 12/17/19  -->  EGA 7+2 WGA  Ultrasound  05/02/19 --->  EDD 12/24/19  --> EGA  6+2 WGA  Authoritative EDD: 12/24/19     Problem List   # Spotting in 1st trimester, possible subchorionic hemorrhage     Prenatal Labs  Blood Type: A+ Antibody: neg  Hct: 40  Rubella immune, Syphilis NR, HBsAg NR, HCV NR HIV NR  Pap 09/2016 NILM, GCCT neg, UCx 1K-10K mixed flora  Glucola 130, Hct 34  RPR: NR  GBS _  Chicken Pox: Hx as child    Prenatal Screening  Cell free DNA: Low risk, XY  CF screen:  negative  SMA screen: low risk    Prenatal Ultrasound(s)  [place] (08/08/19) @ 20+2 WGA:   CL 50.76mm  21+5/21+2/21+2/22+1/21+2/22+1  EFW 449 69%  Normal anatomy     Immunizations  Influenza: declines  TdAP: 10/03/19  RhoGAM: NA    Birth Plan  Breastfeeding:  Yes, discussed 10/17/19  Contraception: undecided, discussed 10/17/19  Peds: considering [facility], discussed 10/17/19    Past OB History    1. Early TOP   2. Current      Past GYN History   Menarche age: 89   Menses: Regular Q28 days prior to pregnancy   History of STIs: hx of chlamydia s/p tx   Last pap: 09/2016 NILM  Pap history: no history of abnormal paps  [place] age: Not asked today  Gyn surgeries: None       Patient Active Problem List    Diagnosis Date Noted   . Pregnancy [Z34.90] 09/07/2019   . Bleeding in early pregnancy [O20.9] 05/02/2019       Medications:    Current Outpatient Medications:   .  omeprazole 20 MG DR capsule, Take 1 capsule (20 mg) by mouth daily on an empty stomach., Disp: 30 capsule, Rfl: 2  .  Prenatal Vit-Fe Fumarate-FA (prenatal mutlivitamin iron-folic acid) 78-4 MG tablet, Take 1 tablet by mouth daily., Disp: 90 tablet, Rfl:  6    Allergies:  Review of patient's allergies indicates:  No Known Allergies    ROS:    Negative except as per HPI    Physical Exam:   Vitals:    12/01/19 1618   BP: 117/79   BP Cuff Size: Regular   BP Site: Right Arm   BP Position: Sitting   Pulse: 93   Temp: 97.9 F (36.6 C)   TempSrc: Temporal   Weight: 194 lb 4.8 oz (88.1 kg)     Gen: well appearing, nad, ambulates without assistance   Cardiovascular: regular rate, 2+ peripheral pulses  Respiratory: lungs good respiratory effort, no use of accessory muscles.  Gastrointestinal: soft, gravid, nontender  Neurological: alert and oriented x 3  Psychiatric: bright and reactive affect.    FHR: see notes    Bedside ultrasound 6/96/29: cephalic presentation, posterior placenta, cardiac motion confirmed, subjectively normal fluid       Assessment: 29 year old G18P0010 F at [redacted]w[redacted]d  by 6 week [place] here for prenatal care.    #  OB   -IUP Confirmed 05/02/19  -Prenatal labs wnl.   -Continue prenatal vitamins. Hx of chicken pox in past.   -Pre-pregnancy BMI: 24.88, recommended 25-35 pounds of weight gain this pregnancy   -TWG 49 lb 4.8 oz (22.4 kg) discussed 3/10  - 12/01/19: re-discussed IOL: potentially interested in rrIOL at 39 wga vs 40 wga -- to resdiscuss at next visit    # FWB: Fetus with dating by 6 wk ultrasound.     # Prenatal Screening for aneuploidy and NTD explained. Discussed options for prenatal diagnosis including both non-invasive and invasive screening.   -Maternal age related aneuploidy risk: low   -Patient elects for cell free DNA- low risk, XY  -MsAFP neg  -CF/SMA neg screen  -Anatomy Scan normal and complete    # Vaccines: Influenza- declined x2. Counseled on strong recommendation in pregnancy. Tdap received 10/03/19.    # BCM: condoms->6 week discussion (possibly IUD)    Return to clinic 2 weeks  At Next Visit: review GBS result collected today, discuss rrIOL/possibly schedule     - <28wga = q4week visits                          - 11-14wga: NT [place] with PAPP-A  if integrated screen                          - 16-18wga: Quad screen (16-22wga) or isolated AFP if cfDNA done                          - 18-20wga: Anatomy US                          - 26-28wga: Glucola, repeat Hct, Rhogam if Rh-, HIV/RPR if high risk              - 28-36wga = q2week visits                          - 28-32wga: TDaP                          - 35-37wga: GBS swab, repeat GC/CT if indicated, [place] for position                          - 36wga: weekly NSTs for AMA              -37-41wga = weekly visits                          - schedule IOL for 41 weeks if not delivered

## 2019-12-02 NOTE — Progress Notes (Signed)
I saw and evaluated the patient. I have reviewed the resident's documentation and agree with it.

## 2019-12-03 LAB — CULTURE:R/O GP.B STREP:GENITAL

## 2019-12-07 ENCOUNTER — Other Ambulatory Visit: Payer: Self-pay

## 2019-12-07 NOTE — Progress Notes (Signed)
RETURN OB VISIT    ID/CC: 29 year old G50P0010 female at [redacted]w[redacted]d by 6 week [place] who presents for a return OB visit.    HPP: Doing well today. No concerns. Baby moving. Denies VB, LOF, or ctxs.     PRIMARY OB: [name]    REVIEW OF DATES  LMP  03/12/19   --> EDD 12/17/19  -->  EGA 7+2 WGA  Ultrasound  05/02/19 --->  EDD 12/24/19  --> EGA  6+2 WGA  Authoritative EDD: 12/24/19     Problem List   # Spotting in 1st trimester, possible subchorionic hemorrhage     Prenatal Labs  Blood Type: A+ Antibody: neg  Hct: 40  Rubella immune, Syphilis NR, HBsAg NR, HCV NR HIV NR  Pap 09/2016 NILM, GCCT neg, UCx 1K-10K mixed flora  Glucola 130, Hct 34  RPR: NR  GBS negative 12/01/19  Chicken Pox: Hx as child    Prenatal Screening  Cell free DNA: Low risk, XY  CF screen:  negative  SMA screen: low risk    Prenatal Ultrasound(s)  [place] (08/08/19) @ 20+2 WGA:   CL 50.72mm  21+5/21+2/21+2/22+1/21+2/22+1  EFW 449 69%  Normal anatomy     Immunizations  Influenza: declines  TdAP: 10/03/19  RhoGAM: NA    Birth Plan  Breastfeeding:  Yes, discussed 10/17/19  Contraception: undecided, discussed 10/17/19  Peds: considering [place], discussed 10/17/19    Past OB History    1. Early TOP   2. Current      Past GYN History   Menarche age: 43   Menses: Regular Q28 days prior to pregnancy   History of STIs: hx of chlamydia s/p tx   Last pap: 09/2016 NILM  Pap history: no history of abnormal paps  Coitarche age: Not asked today  Gyn surgeries: None       Patient Active Problem List    Diagnosis Date Noted   . Pregnancy [Z34.90] 09/07/2019   . Bleeding in early pregnancy [O20.9] 05/02/2019       Medications:    Current Outpatient Medications:   .  omeprazole 20 MG DR capsule, Take 1 capsule (20 mg) by mouth daily on an empty stomach., Disp: 30 capsule, Rfl: 2  .  Prenatal Vit-Fe Fumarate-FA (prenatal mutlivitamin iron-folic acid) 29-5 MG tablet, Take 1 tablet by mouth daily., Disp: 90 tablet, Rfl: 6    Allergies:  Review of patient's allergies indicates:  No Known  Allergies    ROS:    Negative except as per HPI    Physical Exam:   Vitals:    12/08/19 1602   BP: 137/84   BP Cuff Size: Regular   Pulse: 85   Weight: 89 kg (196 lb 3.2 oz)     Gen: well appearing, nad, ambulates without assistance   Cardiovascular: regular rate, 2+ peripheral pulses  Respiratory: lungs good respiratory effort, no use of accessory muscles.  Gastrointestinal: soft, gravid, nontender  Neurological: alert and oriented x 3  Psychiatric: bright and reactive affect.    FHR: see notes    Bedside ultrasound 2/84/13: cephalic presentation, posterior placenta, cardiac motion confirmed, subjectively normal fluid       Assessment: 29 year old G74P0010 F at [redacted]w[redacted]d by 6 week [place] here for prenatal care.    # OB   -IUP Confirmed 05/02/19  -Prenatal labs wnl.   -Continue prenatal vitamins. Hx of chicken pox in past.   -Pre-pregnancy BMI: 24.88, recommended 25-35 pounds of weight gain this pregnancy   -  TWG 23.2 kg (51 lb 3.2 oz) discussed 3/10  - 4/1: re-discussed IOL: scheduled for impending post-dates at 41 weeks on 4/24    # FWB: Fetus with dating by 6 wk ultrasound.     # Prenatal Screening for aneuploidy and NTD explained. Discussed options for prenatal diagnosis including both non-invasive and invasive screening.   -Maternal age related aneuploidy risk: low   -Patient elects for cell free DNA- low risk, XY  -MsAFP neg  -CF/SMA neg screen  -Anatomy Scan normal and complete    # Vaccines: Influenza- declined x2. Counseled on strong recommendation in pregnancy. Tdap received 10/03/19.    # BCM: condoms->6 week discussion (possibly IUD)    Return to clinic: 1 week  At Next Visit: routine OB

## 2019-12-08 ENCOUNTER — Encounter (HOSPITAL_BASED_OUTPATIENT_CLINIC_OR_DEPARTMENT_OTHER): Payer: PPO | Admitting: Unknown Physician Specialty

## 2019-12-08 ENCOUNTER — Ambulatory Visit: Payer: PPO | Attending: Obstetrics & Gynecology | Admitting: Unknown Physician Specialty

## 2019-12-08 VITALS — BP 122/83 | HR 82 | Wt 196.2 lb

## 2019-12-08 DIAGNOSIS — Z3493 Encounter for supervision of normal pregnancy, unspecified, third trimester: Secondary | ICD-10-CM | POA: Insufficient documentation

## 2019-12-08 DIAGNOSIS — Z3A37 37 weeks gestation of pregnancy: Secondary | ICD-10-CM

## 2019-12-08 NOTE — Progress Notes (Signed)
I saw and evaluated the patient. I have reviewed the resident's documentation and agree with it.

## 2019-12-11 ENCOUNTER — Telehealth (HOSPITAL_COMMUNITY): Payer: Self-pay

## 2019-12-11 NOTE — Telephone Encounter (Signed)
OB RN TELEPHONE TRIAGE NOTE    Chief Complaint   Patient presents with   . Abdominal Pain       TRIAGE INFORMATION:    CALL RECEIVED:  Clinic Location: OB triage    Interpreter/Language Spoken:   not applicable    Patient's Provider and Clinic:   Provider: J. [name]  Clinic: [facility]    Dec 24, 2019  [redacted]w[redacted]d  G1P0    Fetal Movement: Present    Vaginal Bleeding: Denies    Vaginal Fluid/Leaking: Denies    Contractions: Endorses  Onset/frequency/duration/coping: "Strong menstrual cramps" every 5-12 minutes, lasting 8-25 seconds.    Hypertensive symptoms: Denies/  Denies the following: All symptoms    Past Medical History:   Diagnosis Date   . Anxiety    . Depression      Past Surgical History:   Procedure Laterality Date   . NO PRIOR SURGERIES           Current Outpatient Medications:   .  omeprazole 20 MG DR capsule, Take 1 capsule (20 mg) by mouth daily on an empty stomach., Disp: 30 capsule, Rfl: 2  .  Prenatal Vit-Fe Fumarate-FA (prenatal mutlivitamin iron-folic acid) 29-1 MG tablet, Take 1 tablet by mouth daily., Disp: 90 tablet, Rfl: 6    Assessment:  Patient calling with strong menstrual cramps throughout the day, wants to know if she is in labor.    Plan:  Discussed with patient that she may be in early labor, but is not at the point she should come to the hospital yet. Reviewed that we like contractions to get closer together and last longer prior to coming in. Reviewed labor precautions and reason to call triage back with patient, verbalizes understanding.     Interventions: Home care  Follow-Up: Home care and Call back PRN

## 2019-12-12 ENCOUNTER — Other Ambulatory Visit: Payer: Self-pay

## 2019-12-12 ENCOUNTER — Inpatient Hospital Stay (HOSPITAL_COMMUNITY): Payer: PPO | Admitting: Anesthesiology

## 2019-12-12 ENCOUNTER — Encounter (HOSPITAL_COMMUNITY): Payer: Self-pay | Admitting: Obstetrics/Gynecology

## 2019-12-12 ENCOUNTER — Encounter (HOSPITAL_COMMUNITY)
Admission: AD | Disposition: A | Discharge status: Home / Self Care | Payer: Self-pay | Source: Home / Self Care | Attending: Obstetrics/Gynecology

## 2019-12-12 ENCOUNTER — Inpatient Hospital Stay
Admission: AD | Admit: 2019-12-12 | Discharge: 2019-12-14 | DRG: 766 | Disposition: A | Discharge status: Home / Self Care | Payer: PPO | Attending: Obstetrics/Gynecology | Admitting: Obstetrics/Gynecology

## 2019-12-12 ENCOUNTER — Inpatient Hospital Stay (HOSPITAL_COMMUNITY): Payer: PPO | Admitting: Student in an Organized Health Care Education/Training Program

## 2019-12-12 ENCOUNTER — Inpatient Hospital Stay (HOSPITAL_COMMUNITY): Payer: Self-pay | Admitting: Obstetrics/Gynecology

## 2019-12-12 DIAGNOSIS — Z87891 Personal history of nicotine dependence: Secondary | ICD-10-CM

## 2019-12-12 DIAGNOSIS — Z3A38 38 weeks gestation of pregnancy: Secondary | ICD-10-CM

## 2019-12-12 DIAGNOSIS — O471 False labor at or after 37 completed weeks of gestation: Secondary | ICD-10-CM | POA: Diagnosis present

## 2019-12-12 HISTORY — DX: 36 weeks gestation of pregnancy: Z3A.36

## 2019-12-12 LAB — CBC, DIFF
% Basophils: 0 %
% Eosinophils: 0 %
% Immature Granulocytes: 2 %
% Lymphocytes: 11 %
% Monocytes: 3 %
% Neutrophils: 84 %
% Nucleated RBC: 0 %
Absolute Eosinophil Count: 0 10*3/uL (ref 0.00–0.50)
Absolute Lymphocyte Count: 1.59 10*3/uL (ref 1.00–4.80)
Basophils: 0.04 10*3/uL (ref 0.00–0.20)
Hematocrit: 35 % — ABNORMAL LOW (ref 36–45)
Hemoglobin: 11.1 g/dL — ABNORMAL LOW (ref 11.5–15.5)
Immature Granulocytes: 0.25 10*3/uL — ABNORMAL HIGH (ref 0.00–0.05)
MCH: 26.7 pg — ABNORMAL LOW (ref 27.3–33.6)
MCHC: 31.5 g/dL — ABNORMAL LOW (ref 32.2–36.5)
MCV: 85 fL (ref 81–98)
Monocytes: 0.45 10*3/uL (ref 0.00–0.80)
Neutrophils: 12.57 10*3/uL — ABNORMAL HIGH (ref 1.80–7.00)
Nucleated RBC: 0 10*3/uL
Platelet Count: 194 10*3/uL (ref 150–400)
RBC: 4.15 10*6/uL (ref 3.80–5.00)
RDW-CV: 14.3 % (ref 11.6–14.4)
WBC: 14.9 10*3/uL — ABNORMAL HIGH (ref 4.3–10.0)

## 2019-12-12 LAB — TYPE AND SCREEN
ABO/Rh: A POS
Antibody Screen: NEGATIVE

## 2019-12-12 LAB — SARS-COV-2 (COVID-19) QUALITATIVE RAPID PCR: COVID-19 Coronavirus Qual PCR Result: NOT DETECTED

## 2019-12-12 LAB — BLOOD TYPE CONFIRMATION: ABO/Rh: A POS

## 2019-12-12 SURGERY — Surgical Case
Anesthesia: Regional | Site: Abdomen | Wound class: Class II/ Clean Contaminated

## 2019-12-12 MED ORDER — POLYETHYLENE GLYCOL 3350 17 G OR PACK
17.0000 g | PACK | Freq: Every day | ORAL | Status: DC | PRN
Start: 2019-12-12 — End: 2019-12-12

## 2019-12-12 MED ORDER — SODIUM CHLORIDE 0.9 % IV SOLN
INTRAVENOUS | Status: AC
Start: 2019-12-12 — End: ?
  Filled 2019-12-12: qty 100

## 2019-12-12 MED ORDER — FENTANYL-BUPIVACAINE-NACL 0.5-0.0625-0.9 MG/250ML-% EP SOLN
EPIDURAL | Status: AC
Start: 2019-12-12 — End: ?
  Filled 2019-12-12: qty 250

## 2019-12-12 MED ORDER — ONDANSETRON HCL 4 MG/2ML IJ SOLN
4.0000 mg | Freq: Three times a day (TID) | INTRAMUSCULAR | Status: DC | PRN
Start: 2019-12-12 — End: 2019-12-14

## 2019-12-12 MED ORDER — OXYTOCIN 10 UNIT/ML IJ SOLN
INTRAMUSCULAR | Status: DC | PRN
Start: 2019-12-12 — End: 2019-12-13
  Administered 2019-12-12 (×2): 3 [IU] via INTRAVENOUS

## 2019-12-12 MED ORDER — LIDOCAINE HCL PF 2% IV/IJ SOSY/SOLN WRAPPER (ANESTHESIA OSM ONLY)
INTRAVENOUS | Status: DC | PRN
Start: 2019-12-12 — End: 2019-12-13
  Administered 2019-12-12: 100 mg via INTRAVENOUS
  Administered 2019-12-12: 200 mg via EPIDURAL
  Administered 2019-12-12: 200 mg via INTRAVENOUS

## 2019-12-12 MED ORDER — BUPIVACAINE PF 0.125% IN NS 10 ML SYRINGE
INTRAMUSCULAR | Status: DC | PRN
Start: 2019-12-12 — End: 2019-12-13
  Administered 2019-12-12: 10 mL via EPIDURAL

## 2019-12-12 MED ORDER — PHENYLEPHRINE HCL-NACL 25-0.9 MG/250ML-% IV SOLN
INTRAVENOUS | Status: DC | PRN
Start: 2019-12-12 — End: 2019-12-13
  Administered 2019-12-12: 0.2 ug/kg/min via INTRAVENOUS

## 2019-12-12 MED ORDER — OMEPRAZOLE 20 MG OR CPDR
20.0000 mg | DELAYED_RELEASE_CAPSULE | Freq: Every day | ORAL | Status: DC
Start: 2019-12-12 — End: 2019-12-13
  Administered 2019-12-12: 20 mg via ORAL
  Filled 2019-12-12: qty 1

## 2019-12-12 MED ORDER — LACTATED RINGERS IV SOLN
125.0000 mL/h | INTRAVENOUS | Status: DC
Start: 2019-12-12 — End: 2019-12-13

## 2019-12-12 MED ORDER — ACETAMINOPHEN 500 MG OR TABS
1000.0000 mg | ORAL_TABLET | Freq: Four times a day (QID) | ORAL | Status: DC | PRN
Start: 2019-12-12 — End: 2019-12-13
  Administered 2019-12-12: 1000 mg via ORAL
  Filled 2019-12-12: qty 2

## 2019-12-12 MED ORDER — POLYETHYLENE GLYCOL 3350 17 G OR PACK
17.0000 g | PACK | Freq: Every day | ORAL | Status: DC | PRN
Start: 2019-12-12 — End: 2019-12-13

## 2019-12-12 MED ORDER — ONDANSETRON HCL 4 MG/2ML IJ SOLN
INTRAMUSCULAR | Status: DC | PRN
Start: 2019-12-12 — End: 2019-12-13
  Administered 2019-12-12: 4 mg via INTRAVENOUS

## 2019-12-12 MED ORDER — DEXAMETHASONE SOD PHOSPHATE PF 10 MG/ML IJ SOLN
INTRAMUSCULAR | Status: AC
Start: 2019-12-12 — End: ?
  Filled 2019-12-12: qty 1

## 2019-12-12 MED ORDER — AZITHROMYCIN 500 MG IN NS 250 ML IVPB (PKG PREMIX)
500.0000 mg | INJECTION | Freq: Once | Status: AC
Start: 2019-12-12 — End: 2019-12-13

## 2019-12-12 MED ORDER — SIMETHICONE 80 MG OR CHEW
80.0000 mg | CHEWABLE_TABLET | Freq: Three times a day (TID) | ORAL | Status: DC | PRN
Start: 2019-12-12 — End: 2019-12-14

## 2019-12-12 MED ORDER — FENTANYL CITRATE (PF) 100 MCG/2ML IJ SOLN
INTRAMUSCULAR | Status: AC
Start: 2019-12-12 — End: ?
  Filled 2019-12-12: qty 2

## 2019-12-12 MED ORDER — KETOROLAC TROMETHAMINE 15 MG/ML IJ SOLN
INTRAMUSCULAR | Status: AC
Start: 2019-12-12 — End: ?
  Filled 2019-12-12: qty 1

## 2019-12-12 MED ORDER — EPINEPHRINE PF 1 MG/ML IJ SOLN
INTRAMUSCULAR | Status: DC | PRN
Start: 2019-12-12 — End: 2019-12-13
  Administered 2019-12-12: 100 ug via EPIDURAL

## 2019-12-12 MED ORDER — SENNOSIDES 8.6 MG OR TABS
8.6000 mg | ORAL_TABLET | Freq: Two times a day (BID) | ORAL | Status: DC | PRN
Start: 2019-12-12 — End: 2019-12-14

## 2019-12-12 MED ORDER — OXYTOCIN-SODIUM CHLORIDE 15-0.9 UT/250ML-% IV SOLN
INTRAVENOUS | Status: AC
Start: 2019-12-12 — End: ?
  Filled 2019-12-12: qty 250

## 2019-12-12 MED ORDER — KETOROLAC TROMETHAMINE 15 MG/ML IJ SOLN
INTRAMUSCULAR | Status: DC | PRN
Start: 2019-12-12 — End: 2019-12-13
  Administered 2019-12-12: 15 mg via INTRAVENOUS

## 2019-12-12 MED ORDER — SODIUM BICARBONATE 8.4 % IV SOLN
INTRAVENOUS | Status: DC | PRN
Start: 2019-12-12 — End: 2019-12-13
  Administered 2019-12-12: 1 meq via EPIDURAL

## 2019-12-12 MED ORDER — OXYTOCIN-SODIUM CHLORIDE 15-0.9 UT/250ML-% IV SOLN
INTRAVENOUS | Status: AC
Start: 2019-12-12 — End: 2019-12-12
  Administered 2019-12-12: 2 m[IU]/min via INTRAVENOUS
  Filled 2019-12-12: qty 250

## 2019-12-12 MED ORDER — FAMOTIDINE 20 MG OR TABS
20.0000 mg | ORAL_TABLET | Freq: Two times a day (BID) | ORAL | Status: DC | PRN
Start: 2019-12-12 — End: 2019-12-12

## 2019-12-12 MED ORDER — AZITHROMYCIN 500 MG IN NS 250 ML IVPB (PKG PREMIX)
INJECTION | Status: DC | PRN
Start: 2019-12-12 — End: 2019-12-13
  Administered 2019-12-12: 500 mg via INTRAVENOUS

## 2019-12-12 MED ORDER — BUPIVACAINE PF 0.125% IN NS 10 ML SYRINGE
INTRAMUSCULAR | Status: AC
Start: 2019-12-12 — End: ?
  Filled 2019-12-12: qty 10

## 2019-12-12 MED ORDER — LIDOCAINE HCL (PF) 2 % IJ SOLN (OB ANESTHESIA OSM ONLY)
INTRAMUSCULAR | Status: DC | PRN
Start: 2019-12-12 — End: 2019-12-12

## 2019-12-12 MED ORDER — BISACODYL 5 MG OR TBEC
10.0000 mg | DELAYED_RELEASE_TABLET | Freq: Every day | ORAL | Status: DC | PRN
Start: 2019-12-12 — End: 2019-12-14

## 2019-12-12 MED ORDER — OXYTOCIN-SODIUM CHLORIDE 15-0.9 UT/250ML-% IV SOLN
0.0000 m[IU]/min | INTRAVENOUS | Status: DC
Start: 2019-12-12 — End: 2019-12-13
  Filled 2019-12-12: qty 250

## 2019-12-12 MED ORDER — PHENYLEPHRINE HCL-NACL 1-0.9 MG/10ML-% IV SOSY
PREFILLED_SYRINGE | INTRAVENOUS | Status: DC | PRN
Start: 2019-12-12 — End: 2019-12-13
  Administered 2019-12-12: 100 ug via INTRAVENOUS

## 2019-12-12 MED ORDER — SODIUM BICARBONATE 8.4 % IV SOLN
INTRAVENOUS | Status: AC
Start: 2019-12-12 — End: ?
  Filled 2019-12-12: qty 50

## 2019-12-12 MED ORDER — MAGNESIUM OXIDE 400 MG OR TABS (WRAPPER)
400.0000 mg | ORAL_TABLET | Freq: Two times a day (BID) | ORAL | Status: DC | PRN
Start: 2019-12-12 — End: 2019-12-14

## 2019-12-12 MED ORDER — CALCIUM CARBONATE ANTACID 500 MG OR CHEW
1000.0000 mg | CHEWABLE_TABLET | Freq: Three times a day (TID) | ORAL | Status: DC | PRN
Start: 2019-12-12 — End: 2019-12-14

## 2019-12-12 MED ORDER — EPINEPHRINE PF 1 MG/ML IJ SOLN
INTRAMUSCULAR | Status: AC
Start: 2019-12-12 — End: ?
  Filled 2019-12-12: qty 1

## 2019-12-12 MED ORDER — MEASLES, MUMPS & RUBELLA VAC IJ SOLR
0.5000 mL | Freq: Once | INTRAMUSCULAR | Status: DC | PRN
Start: 2019-12-12 — End: 2019-12-14

## 2019-12-12 MED ORDER — ONDANSETRON HCL 4 MG/2ML IJ SOLN
4.0000 mg | Freq: Three times a day (TID) | INTRAMUSCULAR | Status: DC | PRN
Start: 2019-12-12 — End: 2019-12-12

## 2019-12-12 MED ORDER — LIDOCAINE HCL (PF) 1 % IJ SOLN
INTRAMUSCULAR | Status: AC
Start: 2019-12-12 — End: 2019-12-12
  Filled 2019-12-12: qty 5

## 2019-12-12 MED ORDER — CEFAZOLIN SODIUM-DEXTROSE 2-4 GM/100ML-% IV SOLN
2.0000 g | Freq: Once | INTRAVENOUS | Status: AC
Start: 2019-12-12 — End: 2019-12-12

## 2019-12-12 MED ORDER — OXYTOCIN-SODIUM CHLORIDE 15-0.9 UT/250ML-% IV SOLN
INTRAVENOUS | Status: DC | PRN
Start: 2019-12-12 — End: 2019-12-13
  Administered 2019-12-12: 166.6 m[IU]/min via INTRAVENOUS

## 2019-12-12 MED ORDER — POLYETHYLENE GLYCOL 3350 17 G OR PACK
17.0000 g | PACK | Freq: Every day | ORAL | Status: DC
Start: 2019-12-13 — End: 2019-12-14
  Administered 2019-12-13 – 2019-12-14 (×2): 17 g via ORAL
  Filled 2019-12-12 (×2): qty 1

## 2019-12-12 MED ORDER — BUPIVACAINE HCL (PF) 0.25 % IJ SOLN
INTRAMUSCULAR | Status: DC | PRN
Start: 2019-12-12 — End: 2019-12-13
  Administered 2019-12-12: 1 mL via INTRATHECAL

## 2019-12-12 MED ORDER — ONDANSETRON HCL 4 MG/2ML IJ SOLN
4.0000 mg | Freq: Once | INTRAMUSCULAR | Status: DC | PRN
Start: 2019-12-12 — End: 2019-12-13

## 2019-12-12 MED ORDER — NALBUPHINE HCL 20 MG/ML IJ SOLN
5.0000 mg | INTRAMUSCULAR | Status: DC | PRN
Start: 2019-12-12 — End: 2019-12-13

## 2019-12-12 MED ORDER — LACTATED RINGERS BOLUS
250.0000 mL | INTRAVENOUS | Status: DC | PRN
Start: 2019-12-12 — End: 2019-12-13
  Administered 2019-12-12: 250 mL via INTRAVENOUS

## 2019-12-12 MED ORDER — DEXAMETHASONE SOD PHOSPHATE PF 10 MG/ML IJ SOLN
INTRAMUSCULAR | Status: DC | PRN
Start: 2019-12-12 — End: 2019-12-13
  Administered 2019-12-12: 10 mg via INTRAVENOUS

## 2019-12-12 MED ORDER — METOCLOPRAMIDE HCL 5 MG/ML IJ SOLN
5.0000 mg | Freq: Four times a day (QID) | INTRAMUSCULAR | Status: DC | PRN
Start: 2019-12-12 — End: 2019-12-14

## 2019-12-12 MED ORDER — CEFAZOLIN SODIUM 1 G IJ SOLR
INTRAMUSCULAR | Status: DC | PRN
Start: 2019-12-12 — End: 2019-12-13
  Administered 2019-12-12: 2 g via INTRAVENOUS

## 2019-12-12 MED ORDER — MORPHINE SULFATE (PF) 1 MG/ML IJ SOLN
INTRAMUSCULAR | Status: AC
Start: 2019-12-12 — End: ?
  Filled 2019-12-12: qty 10

## 2019-12-12 MED ORDER — RHO D IMMUNE GLOBULIN 1500 UNIT/2ML IJ SOSY
300.0000 ug | PREFILLED_SYRINGE | Freq: Once | INTRAMUSCULAR | Status: DC | PRN
Start: 2019-12-12 — End: 2019-12-14

## 2019-12-12 MED ORDER — LACTATED RINGERS IV SOLN
0.0000 mL/h | INTRAVENOUS | Status: DC
Start: 2019-12-12 — End: 2019-12-13
  Administered 2019-12-12: 70 mL/h via INTRAVENOUS
  Administered 2019-12-12 (×3): 125 mL/h via INTRAVENOUS
  Filled 2019-12-12 (×3): qty 1000

## 2019-12-12 MED ORDER — ONDANSETRON HCL 4 MG OR TABS
4.0000 mg | ORAL_TABLET | Freq: Three times a day (TID) | ORAL | Status: DC | PRN
Start: 2019-12-12 — End: 2019-12-12
  Administered 2019-12-12: 4 mg via ORAL
  Filled 2019-12-12: qty 1

## 2019-12-12 MED ORDER — ACETAMINOPHEN 500 MG OR TABS
1000.0000 mg | ORAL_TABLET | Freq: Four times a day (QID) | ORAL | Status: DC | PRN
Start: 2019-12-12 — End: 2019-12-12

## 2019-12-12 MED ORDER — METOCLOPRAMIDE HCL 5 MG/ML IJ SOLN
5.0000 mg | INTRAMUSCULAR | Status: DC | PRN
Start: 2019-12-12 — End: 2019-12-13

## 2019-12-12 MED ORDER — MORPHINE SULFATE (PF) 1 MG/ML IJ SOLN
INTRAMUSCULAR | Status: DC | PRN
Start: 2019-12-12 — End: 2019-12-13
  Administered 2019-12-12: 3 mg via EPIDURAL

## 2019-12-12 MED ORDER — NALOXONE HCL 0.4 MG/ML IJ SOLN
0.0400 mg | INTRAMUSCULAR | Status: DC | PRN
Start: 2019-12-12 — End: 2019-12-13

## 2019-12-12 MED ORDER — FAMOTIDINE 20 MG OR TABS
20.0000 mg | ORAL_TABLET | Freq: Two times a day (BID) | ORAL | Status: DC | PRN
Start: 2019-12-12 — End: 2019-12-13

## 2019-12-12 MED ORDER — SENNOSIDES 8.6 MG OR TABS
8.6000 mg | ORAL_TABLET | Freq: Two times a day (BID) | ORAL | Status: DC | PRN
Start: 2019-12-12 — End: 2019-12-12

## 2019-12-12 MED ORDER — LACTATED RINGERS BOLUS
500.0000 mL | INTRAVENOUS | Status: DC | PRN
Start: 2019-12-12 — End: 2019-12-13
  Administered 2019-12-12: 500 mL via INTRAVENOUS
  Administered 2019-12-12: 1000 mL via INTRAVENOUS

## 2019-12-12 MED ORDER — OXYTOCIN-SODIUM CHLORIDE 15-0.9 UT/250ML-% IV SOLN
83.3000 m[IU]/min | INTRAVENOUS | Status: DC
Start: 2019-12-13 — End: 2019-12-14
  Administered 2019-12-13: 83.3 m[IU]/min via INTRAVENOUS

## 2019-12-12 MED ORDER — FENTANYL CITRATE (PF) 50 MCG/ML IJ SOLN WRAPPER (ANESTHESIA OSM ONLY)
INTRAMUSCULAR | Status: DC | PRN
Start: 2019-12-12 — End: 2019-12-13
  Administered 2019-12-12: 40 ug via EPIDURAL
  Administered 2019-12-12: 10 ug via INTRATHECAL
  Administered 2019-12-12: 50 ug via EPIDURAL

## 2019-12-12 MED ORDER — PHENYLEPHRINE HCL-NACL 25-0.9 MG/250ML-% IV SOLN
INTRAVENOUS | Status: AC
Start: 2019-12-12 — End: ?
  Filled 2019-12-12: qty 250

## 2019-12-12 MED ORDER — WITCH HAZEL-GLYCERIN EX PADS
1.0000 | MEDICATED_PAD | Freq: Two times a day (BID) | CUTANEOUS | Status: DC | PRN
Start: 2019-12-12 — End: 2019-12-14

## 2019-12-12 MED ORDER — ENOXAPARIN SODIUM 40 MG/0.4ML IJ SOSY
40.0000 mg | PREFILLED_SYRINGE | Freq: Every morning | INTRAMUSCULAR | Status: DC
Start: 2019-12-13 — End: 2019-12-14
  Administered 2019-12-13 – 2019-12-14 (×2): 40 mg via SUBCUTANEOUS
  Filled 2019-12-12 (×2): qty 0.4

## 2019-12-12 MED ORDER — METHYLERGONOVINE MALEATE 0.2 MG/ML IJ SOLN
INTRAMUSCULAR | Status: DC | PRN
Start: 2019-12-12 — End: 2019-12-13
  Administered 2019-12-12: 0.2 mg via INTRAMUSCULAR

## 2019-12-12 MED ORDER — FENTANYL 2 MCG/ML AND BUPIVACAINE 0.0625% IN NS QS 250 ML EPIDURAL (OB PIEB + PCEA) (PKG PREMIX)
INJECTION | EPIDURAL | Status: DC
Start: 2019-12-12 — End: 2019-12-13

## 2019-12-12 MED ORDER — ALUMINUM & MAGNESIUM HYDROXIDE 200-200 MG/5ML OR SUSP
30.0000 mL | Freq: Four times a day (QID) | ORAL | Status: DC | PRN
Start: 2019-12-12 — End: 2019-12-14

## 2019-12-12 MED ORDER — TRANEXAMIC ACID 1000 MG/10ML IV SOLN
INTRAVENOUS | Status: DC | PRN
Start: 2019-12-12 — End: 2019-12-13
  Administered 2019-12-12: 1000 mg via INTRAVENOUS

## 2019-12-12 MED ORDER — DEXTROSE IN LACTATED RINGERS 5 % IV SOLN
1.0000 mL/kg/h | INTRAVENOUS | Status: DC
Start: 2019-12-13 — End: 2019-12-14
  Administered 2019-12-13: 1 mL/kg/h via INTRAVENOUS
  Filled 2019-12-12: qty 1000

## 2019-12-12 MED ORDER — ONDANSETRON HCL 4 MG/2ML IJ SOLN
INTRAMUSCULAR | Status: AC
Start: 2019-12-12 — End: ?
  Filled 2019-12-12: qty 2

## 2019-12-12 MED ORDER — ONDANSETRON HCL 4 MG OR TABS
4.0000 mg | ORAL_TABLET | Freq: Three times a day (TID) | ORAL | Status: DC | PRN
Start: 2019-12-12 — End: 2019-12-12

## 2019-12-12 MED ORDER — ONDANSETRON HCL 4 MG OR TABS
4.0000 mg | ORAL_TABLET | Freq: Three times a day (TID) | ORAL | Status: DC | PRN
Start: 2019-12-12 — End: 2019-12-14

## 2019-12-12 MED ORDER — LIDOCAINE HCL 200 MG/10ML IJ SOSY
PREFILLED_SYRINGE | INTRAMUSCULAR | Status: AC
Start: 2019-12-12 — End: ?
  Filled 2019-12-12: qty 20

## 2019-12-12 MED ORDER — BISACODYL 10 MG RE SUPP
10.0000 mg | Freq: Every day | RECTAL | Status: DC | PRN
Start: 2019-12-12 — End: 2019-12-14

## 2019-12-12 MED ORDER — LACTATED RINGERS BOLUS
500.0000 mL | Freq: Once | INTRAVENOUS | Status: DC
Start: 2019-12-12 — End: 2019-12-13

## 2019-12-12 SURGICAL SUPPLY — 60 items
APPLICATOR PREP CHLORAPREP 26ML CLR 2% CHG (Prep) ×4 IMPLANT
BAG ISOLATION CLR 20X20IN STERI-DRAPE LG INTESTINE (Drape) IMPLANT
BARRIER CAVILON 3ML (Dressing) IMPLANT
BLADE SURG BARD-PARKER CARBON STEEL 10 (Blade) IMPLANT
BLADE SURG BARD-PARKER CARBON STEEL 20 (Blade) IMPLANT
BLANKET WARM BAIR HUGGER UPPER BODY (Other) IMPLANT
CANISTER SUCTION MEDI-VAC 2000ML LOCK LID (Other) ×2 IMPLANT
CATHETER TRAY SURESTEP 14FR FOLEY (Catheter) ×2 IMPLANT
CLEAN ESURG TIP STERION SS RADIOPQ STERILE LF (Cautery) IMPLANT
CONTAINER FORMALIN NBF 10% PROTOCOL C40ML 20ML (Other) IMPLANT
COUNTER SPONGE BAG LF IV POLE BLUE (Other) ×4 IMPLANT
COVER DRAPE Z FOLD BACK TABLE STERILE-Z STERILE (Drape) IMPLANT
CUP MEDICINE PLASTIC 2OZ (Other) IMPLANT
DEVICE VAC EXTRACTION PALMPUMP KIWI OMNICUP FLEX (Delivery System) IMPLANT
DRAPE IOBAN 60CM X 45CM (Drape) IMPLANT
DRAPE UNDER BUTTOCK 44.5INX40IN GRAD POUCH PORT (Drape) IMPLANT
DRESSING FOAM MEPILEX BORDER 10INX4IN (Dressing) IMPLANT
DRESSING FOAM MEPILEX BORDER 8INX4IN (Dressing) ×2 IMPLANT
DRESSING FOAM MEPILEX BORDER AG 10INX4IN (Dressing) ×2 IMPLANT
ELECTRODE ELECTROSURGICAL NEEDLE PENCIL HANDSWITCHING DISP (Other) IMPLANT
ELECTRODE PATIENT RETURN POLYHESVIE II REM W/ CORD (Other) ×1 IMPLANT
ELECTRODE VALLEYLAB 10FT BLADE 3/32IN ROCKER SWITCH (Other)
HANDLE RIGID STERILE LF DISP DEVON SURG LIGHT (Other) IMPLANT
HEMOSTATIC KIT 10ML FLOSEAL FAST PREP MATRIX (Drug) IMPLANT
KIT DRESSING VAC GRANUFOAM THK3.2CM STD 26CMX15CM (Dressing) IMPLANT
MARKER SKIN 2 TIP RULER LABEL GENTIAN VIOLET INK 6 (Pack) IMPLANT
MATTRESS TRANSFER 78X34IN HOVERMATT REPROCESSED (Other) IMPLANT
PACK CUSTOM C SECTION LTX (Pack) ×2 IMPLANT
PACK GOWN 4 PACK MAX PROTECT (Gown) ×2 IMPLANT
PACK GOWN 4 PACK W/TOWELS (Gown) ×2 IMPLANT
PACKING GAUZE VAGINAL 72INX2IN 4PLY (Packing) IMPLANT
PAD GROUNDING W/9FT CORD POLYHESIVE II (Other) ×1
RETAINER 9 1/8INX5 7/8IN MED VISCERAL STERILE LF (Closure Device) IMPLANT
SCALPEL BARD-PARKER 10 STERILE DISP (Blade) IMPLANT
SCD TUBING (Tubing) ×2 IMPLANT
SEALER/DIVIDER LAPARO 18CM 13.5MM LIGASURE IMPACT (Other) IMPLANT
SKIN CLOSURE STERI-STRIP 4INX1/2IN (Dressing) ×2 IMPLANT
SLEEVE LARGE KENDALL SCD EXPRESS (Other) IMPLANT
SLEEVE PNEUMATIC KNEE MEDIUM REPROCESSED (Other) IMPLANT
SLEEVE SURG UNIV 6IN SMS EVOLUTION 4 LOW LINT (Gown) IMPLANT
SPONGE LAPAROTOMY 18X18IN 4 PLY RADIOPQ PYRONEMA (Sponge) IMPLANT
SPONGE SURG VISTEC 4INX3 1/2IN (Sponge) IMPLANT
STAPLER PREMIUM POLY CS UNIV 57MMX.17MM ABSORB (Other) IMPLANT
STAPLER SKIN 35 WIDE STAPLE CARTRIDGE LF APPOSE (Closure Device) IMPLANT
SUCT HANDLE YANKAUER BULB TIP (Other) IMPLANT
SUTURE MONOCRYL PLUS 4-0 PS-2 18IN (Suture) ×2 IMPLANT
SUTURE PLAIN 0 60IN (Suture) IMPLANT
SUTURE PLAIN 3-0 GS-21 30IN (Suture) ×2 IMPLANT
SUTURE POLYSORB 0 GS-24 36IN VIOLET (Suture) ×8 IMPLANT
SUTURE POLYSORB 2-0 V-20 30IN UNDYED (Suture) IMPLANT
SUTURE POLYSORB 3-0 V-20 30IN VIOLET D TACH (Suture) IMPLANT
SYRINGE 50CC LF STERILE IRRIG FINGER FLANGE BULB (Syringe) IMPLANT
SYRINGE 50ML LL (Syringe) IMPLANT
TISSUE PROTECT ALEXIS LG FLEX RETRACTION RING (Other) IMPLANT
TISSUE PROTECT ALEXIS XL FLEX RETRACTION RING (Other) IMPLANT
TOWEL FOR DRESSING SINGLE ST (Towel) ×2 IMPLANT
TOWEL OR DISPOSABLE STERILE BLUE 6/PK (Towel) ×2 IMPLANT
TOWEL SURG PACK (Towel) ×2 IMPLANT
UNDERPAD CHUX 23 X 24IN (Other) ×4 IMPLANT
UNDERPAD SIMPLICITY 24INX23IN TENDERSORB CHUX (Other) ×4 IMPLANT

## 2019-12-12 NOTE — Progress Notes (Signed)
BRIEF LABOR UPDATE NOTE     Emmaly Leech Jobe Marker") - DOB: October 20, 1990 (29 year old female)  Gender Identity: Female  Preferred Pronouns: patient's name, she/her/hers  Code Status: Full Code     Identification and Chief Complaint:  Ms. Bakke is a 29 year old G1P0 at [redacted]w[redacted]d gestational age (EDD: 12/24/2019, by Ultrasound).     SUBJECTIVE   Ms. Cadle reports that she is feeling well and comfortable.       OBJECTIVE     Vitals:    12/12/19 1014 12/12/19 1044 12/12/19 1121 12/12/19 1152   BP: 110/64 109/70 126/62 106/57   Pulse: 85 90 88 80   Resp:       Temp:    36.5 C   TempSrc:       SpO2:   100%    Weight:       Height:           SVE Trend:   01143/80/-2  03054/90/-2  08106/90/-1  1025     7/90/-1  1200 8/90/-1 AROM, light meconium staining    FHT: Baseline 165, moderate variability, 15x15 accelerations, one late decelerations  Toco: q2-66min    PITOCIN: none      ASSESSMENT/PLAN   Category II tracing, although largely reassuring with one late deceleration    #. Spontaneous labor:   - CSE in place and patient is comfortable  - Continue to monitor, expectant management  - s/p AROM    #. FWB: Fetus reactive on monitor, cephalic EFW8#, GBSneg.   - Continuous external fetal monitoring

## 2019-12-12 NOTE — Anesthesia Preprocedure Evaluation (Signed)
Patient: Sonya Martin  Procedure Information    Date: 12/12/19  Procedure: LABOR CONSULT       OB/Gyn Evaluation  Previous Anesthesia history during deliveries     OB/Gyn    Patient is pregnant now.   Pregnancy Associated Diseases           Patient: Sonya Martin    HPI: 62F G1P0 at 38-2/7 weeks (EDD: 12/24/2019, by U/S) presents with spontaneous labor. Reports GERD that is controlled with omeprazole.    Relevant Problems   No relevant active problems       Relevant surgical history:   Past Surgical History:   Procedure Laterality Date   . NO PRIOR SURGERIES     Wisdom teeth extraction        Medications:   Facility-Administered Medications as of 12/12/2019   Medication Dose Route Frequency Provider Last Rate Last Admin   . acetaminophen (Tylenol) tablet 1,000 mg  1,000 mg Oral q6h PRN Vertell Novak, MD       . famotidine (Pepcid) tablet 20 mg  20 mg Oral BID PRN Vertell Novak, MD       . lactated ringers infusion  0-999 mL/hr Intravenous Continuous Vertell Novak, MD 125 mL/hr at 12/12/19 0425 125 mL/hr at 12/12/19 0425   . lactated ringers IV Bolus 500 mL  500 mL Intravenous PRN Vertell Novak, MD 2,000 mL/hr at 12/12/19 0424 500 mL at 12/12/19 0424   . lidocaine PF (Xylocaine-MPF) 1 % injection ADS Override Pull            . omeprazole (PriLOSEC) DR capsule 20 mg  20 mg Oral Daily empty stomach Savitsky, [place] Chard, MD       . ondansetron (Zofran) injection 4-8 mg  4-8 mg Intravenous q8h PRN Vertell Novak, MD       . ondansetron (Zofran) tablet 4-8 mg  4-8 mg Oral q8h PRN Vertell Novak, MD       . polyethylene glycol 3350 (Miralax) packet 17 g  17 g Oral Daily PRN Vertell Novak, MD       . senna (Senokot) tablet 8.6 mg  8.6 mg Oral BID PRN Vertell Novak, MD         Outpatient Medications as of 12/12/2019   Medication Sig Dispense Refill   . omeprazole 20 MG DR capsule Take 1 capsule (20 mg) by mouth daily on an empty  stomach. 30 capsule 2   . Prenatal Vit-Fe Fumarate-FA (prenatal mutlivitamin iron-folic acid) 29-1 MG tablet Take 1 tablet by mouth daily. 90 tablet 6       Review of patient's allergies indicates:  No Known Allergies    Social History:   Social History     Tobacco Use   . Smoking status: Former Games developer   . Smokeless tobacco: Never Used   Substance Use Topics   . Alcohol use: Not Currently   . Drug use: Not Currently       Medical History and Review of Systems  Documentation Reviewed: patient summary   Source of Information: In person visit and Chart reviewPrevious anesthesia: No   no family history of anesthetic complications:    Functional Status  able to climb 2 flights of stairs or more without stopping     Cardiovascular  neg cardio ROS  (-) past MI    Pulmonary  neg pulmonary ROS  (-) asthma    Neuro/Psych  neg neuro/psych ROS(+) , ,  HEENT  negative HEENT ROS    GI/Hepatic/Renal  (+) GERD well controlled,      Endo/Immunology  neg Endo/immunology ROS    Hematology  negative hematology ROS    Oncology  negative oncology ROS    Musculoskeletal  negative musculoskeletal ROS     Skin  negative skin ROS        Physical Exam  Airway  Mallampati:  III  Upper Lip Bite Test: I  TM distance:  >6 cm  Neck ROM:  Full  Mouth Opening:  Normal  Facial Hair:  None    Dental  normal      Cardiovascular  normal      Pulmonary  normal             Labs:   WBC (10*3/uL)   Date Value   12/12/2019 14.90 (H)     RBC (10*6/uL)   Date Value   12/12/2019 4.15     Hemoglobin (g/dL)   Date Value   12/12/2019 11.1 (L)     Hematocrit (%)   Date Value   12/12/2019 35 (L)     MCV (fL)   Date Value   12/12/2019 85     MCH (pg)   Date Value   12/12/2019 26.7 (L)     MCHC (g/dL)   Date Value   12/12/2019 31.5 (L)     Platelet Count (10*3/uL)   Date Value   12/12/2019 194     RDW-CV (%)   Date Value   12/12/2019 14.3     % Neutrophils (%)   Date Value   12/12/2019 84     % Lymphocytes (%)   Date Value   12/12/2019 11     % Monocytes (%)   Date  Value   12/12/2019 3     % Eosinophils (%)   Date Value   12/12/2019 0     % Basophils (%)   Date Value   12/12/2019 0     % Immature Granulocytes (%)   Date Value   12/12/2019 2     Neutrophils (10*3/uL)   Date Value   12/12/2019 12.57 (H)     Absolute Lymphocyte Count (10*3/uL)   Date Value   12/12/2019 1.59     Monocytes (10*3/uL)   Date Value   12/12/2019 0.45     Absolute Eosinophil Count (10*3/uL)   Date Value   12/12/2019 0.00     Basophils (10*3/uL)   Date Value   12/12/2019 0.04     Immature Granulocytes (10*3/uL)   Date Value   12/12/2019 0.25 (H)       Anesthesia Plan    PAT Discussion  PAT Anesthesia Plan: regional  Assessment / Additional Concerns: Described neuraxial techniques for labor analgesia and the risks associated with them, including headaches, failed block, infection, bleeding, nerve injury, paralysis, hypotension, and pruritis. Also discussed the possibility of GA for urgent/emergent cesarean delivery. All questions were addressed. Sonya Martin is interested in an epidural for this delivery.    Supervising Provider - Day of Procedure  ASA 2     Planned Anesthetic Type: labor analgesia  No  Anesthetic plan and risks discussed with patient.

## 2019-12-12 NOTE — H&P (Signed)
ADMIT NOTE     Ivyanna Sibert Almira Bar") - DOB: [birth year] (29 year old female)  Gender Identity: Female  Preferred Pronouns: patient's name, she/her/hers  PCP: Pcp, None   Code Status: Full Code     PRIMARY OB PROVIDER: [name]    CHIEF CONCERN / IDENTIFICATION:  Ms. Amble is a 29 year old G1P0 at 76w2dgestational age (EDD: 12/24/2019, by Ultrasound) who presents with increasingly painful contractions.       SUBJECTIVE   OB PROBLEM LIST:   Principal Problem:    Spontaneous labor  Active Problems:    Spontaneous onset of labor after 374but before 343completed weeks gestation with delivery by planned cesarean section, delivered with mention of postpartum complication    HISTORY OF PRESENT PREGNANCY: Pregnancy complicated by the above problem list.  She presents to L&D with increasingly painful contractions. She hasn't had a cervical exam this pregnancy. She states they are now q3-4 min and up to 6-7/10 in pain. She endorses fetal movement, denies any LOF or VB.    Review of Systems: Pertinent findings are noted in the above HPI.    HISTORY REVIEWED:   I have reviewed the patient's medical history, social history, surgical history and family history with the patient.      OUTPATIENT MEDICATIONS:   Current Outpatient Medications   Medication Instructions   . omeprazole (PRILOSEC) 20 mg, Oral, Daily empty stomach   . Prenatal Vit-Fe Fumarate-FA (prenatal mutlivitamin iron-folic acid) 247-0MG tablet 1 tablet, Oral, Daily       ALLERGIES:   Patient has no known allergies.     OBJECTIVE     Vitals (Arrival)      T: 36.6 C (12/12/19 0108)  BP: 125/80 (12/12/19 0108)  HR: 94 (12/12/19 0108)  RR: 18 (12/12/19 0108)  SpO2: 100 % (12/12/19 0108)   Vitals (Most recent)   T: 36.4 C  BP: 126/86  HR: 94   RR: 16  SpO2: 100 %  T range: Temp  Min: 36.4 C  Max: 36.6 C  Wt 193 lb (87.544 kg)     Ht '5\' 4"'$  (1.626 m)     Body mass index is 33.13 kg/m.     GENERAL: healthy, alert, no distress except breathing  through contractions.  NEURO:  Grossly normal to observation  PSYCH: alert, oriented to person, place, and time, affect appropriate to mood  HEAD/FACE: Normocephalic. No masses, lesions, tenderness or abnormalities  RESPIRATORY: Nonlabored breathing except increased effort during contractions  ABDOMEN:  soft, non-tender. No masses or organomegaly, gravid.  EXTREMITIES: symmetric and no edema  SKIN: Skin color, texture, turgor normal. No rashes or concerning lesions   PELVIC: External genitalia: no lesions.  SVE: 3/80/-2, soft, mid at 0114 --> 4/90/-2, soft, mid at 0310    LEOPOLD's: cephalic, EFW  8#    FHT: baseline 135, mod variability, 15x15 accels, no decels  TOCO: q4-5 min      Labs (last 24 hours):   Chemistries  CBC  LFT  Gases, other   - - - -   -   AST: - ALT: -  -/-/-/-  -/-/-/-   - - -   - >< -  AP: - T bili: -  Lact (a): - Lact (v): -   eGFR: - Ca: -   -   Prot: - Alb: -  Trop I: - D-dimer: -   Mg: - PO4: -  ANC: -     BNP: -  Anti-Xa: -     ALC: -    INR: -        OB Ultrasound  Procedure:  OB Ultrasound - Limited  Fetus cephalic       ASSESSMENT/PLAN   29 year old year old G1P0 at 72w2dgestational age (EDD: 12/24/2019, by Ultrasound) presents in spontaneous labor.    #. Spontaneous labor: The patient is spontaneously laboring with initial cervical exam 3/80/-2 and progressing to 4/90/-2 over the course of 2 hours. She has not yet ruptured her membranes. Will plan to admit for expectant management at this time.   - Continue to monitor, patient requests an epidural  - Patient <39wga, not a candidate for augmentation unless SROM    #. FWB: Fetus reactive on monitor, cephalic EFW 8#, GBS neg.    - Continuous external fetal monitoring     #. Prophylaxis: SCDs, ambulation     #. Prenatal care:    - Postpartum contraception: condoms > possible interval IUD  - Immunizations: Tdap rec'd, Influenza declined x2    #. Disposition: patient status reviewed, Inpatient    Note cc'd to provider/clinic: JLevora Angel MD // [facility]

## 2019-12-12 NOTE — Progress Notes (Signed)
BRIEF LABOR UPDATE NOTE     Sonya Martin Sonya Martin") - DOB: Oct 27, 1990 (29 year old female)  Gender Identity: Female  Preferred Pronouns: patient's name, she/her/hers  Code Status: Full Code     Identification and Chief Complaint:  Sonya Martin is a 29 year old G1P0 at [redacted]w[redacted]d gestational age (EDD: 12/24/2019, by Ultrasound).     SUBJECTIVE   Patient reports that she is feeling comfortable with the anesthesia.       OBJECTIVE     Vitals:    12/12/19 0651 12/12/19 0656 12/12/19 0701 12/12/19 0714   BP:    100/59   Pulse: 83 78 78 79   Resp:       Temp:       TempSrc:       SpO2: 99% 100% 100%    Weight:       Height:           SVE Trend:  0114 3/80/-2  0305 4/90/-2  0810 6/90/-1    FHT: Baseline 140, moderate variability, 15x15 accelerations, without decelerations  Toco: q3-68min    PITOCIN: none     ASSESSMENT/PLAN   Category I tracing    #. Spontaneous labor: The patient is spontaneously laboring, with progression of SVE from 3/80/-2 at 0114 to 6/90/-1 at 0810. CSE in place and patient is comfortable  - Continue to monitor, expectant management  - Consider AROM at future exam    #. FWB: Fetus reactive on monitor, cephalic EFW 8#, GBS neg.    - Continuous external fetal monitoring

## 2019-12-12 NOTE — Op Note (Signed)
OB C-Section Delivery Note    12/12/2019  Referring/Primary Provider: Raynelle Chary    Review the Delivery Report for details.     Gestational Age: [redacted]w[redacted]d  Gravida/Para: G1P0  Labor Complications: Failure to Progress in Second Stage    Quantitative Blood Loss:   IO Blood Loss  12/12/19 0810 - 12/12/19 2239    None          Total IV Fluids:  Drains (Urine Output):   Urethral Catheter 12/12/19 (Active)      Delivery Type: C-Section, Low Transverse   ROM to Delivery Time: 11.5 hrs  DVT Prophylaxis: SCDs  Preoperative Antibiotic: Cefazolin 2gm IV and Azithromycin 500 mg IV    Attending Provider: Maralyn Sago , MD  Resident: Vertell Novak , MD PGY4  Delivery Provider: Azzie Roup , MD PGY2    C-Section Details    Pre-Op Diagnosis: 1. Intrauterine pregnancy at [redacted]w[redacted]d  2. Pre-op Diagnosis     * Prolonged second stage of labor [O63.1]  3. Nonreassuring fetal heart tracing   Post-Op Diagnosis: 1. Post-op Diagnosis     * Prolonged second stage of labor [O63.1]  2. S/p CS for 2nd stage arrest   Indications:       Procedure: Procedure(s):  CESAREAN SECTION  primary    C-Section, Low Transverse  via  Pfannenstiel incision   Anesthesia: Spinal-Epidural    Findings:    Information for the patient's newborn:  Brandice, Busser [[id number]]     @[id number] @           Newborn Weight: 3.857 kg (8 lb 8.1 oz)    1 Minute 5 Minute 10 Minute   Apgar Totals: 9   9          Normal uterus, tubes, and ovaries.  Umbilical artery pH 7.25 BD 2.5 and Umbilical vein pH 7.32 BD 1.2.   Complications: None.    Specimens: cord gases   Implants: None.     Informed Consent:  The risks, benefits, complications, and alternatives were discussed with the patient. Consent was obtained.     Indications for Procedure:  Principal Problem:    Spontaneous labor  Active Problems:    Prolonged second stage of labor    Delivery of pregnancy by cesarean section      Sonya Martin, a 29 year old G1P0 female, was admitted on 12/12/2019 at 38+2  wga in spontaneous labor. She had an unconomplicated pregnancy and did not have any significant past medical issues. Her cervical exam on admissi was 3/80/-2. She continued to progress spontaneously and had bulging membranes, at which point she underwent amniotomy at 1200 on 12/12/19 with clear fluid. After her amniotomy her exam was 5/90/-1. She was then found to be in the active phase of labor almost 6 hours after amniotomy, with an exam of 9/C/0. She progressed to complete cervical dilation 1 hr after this with an exam of C/C/+1. She pushed with very good maternal effort for two hours with descent of the fetal head to +2 station. At this point the fetus was noted to have 2 cm of caput. Fetal monitoring was also notable for deep recurrent variable decelerations to the 70-80s. Fetal heart rate earlier in pushing recovered well with moderate variability in between contractions, however, worsened to occasional periods of minimal variability between contractions. At this point a Cesarean section was recommended to the patient for second stage arrest, non-reassuring fetal tracing, and concern for CPD. The fetus was an estimated 8.5lbs,  thus not an appropriate candidate for an assisted delivery. The risks and benefits were discussed and a consent was signed.    Procedure Details:    The patient was taken to the operating room where Regional anesthesia was confirmed. She was positioned in the dorsal supine position with a leftward tilt and a foley catheter was in place drain the bladder intraoperatively. The fetal head was de-stationed just prior to surgical prep. She was prepped and draped in the usual sterile fashion. A Pfannenstiel skin incision was made with a scalpel and carried through sharply and with bovie cautery to the underlying layer of fascia. The fascia was incised sharply at the midline and extended laterally with Mayo scissors. The superior edge of the fascia was grasped with Kocher clamps to elevate the  fascia and the rectus muscles were separated from the fascia at the midline with blunt and sharp dissection to just below the umbilicus and inferiorly to the symphysis. The abdominal cavity was entered bluntly. The peritoneum was then opened superiorly and inferiorly to the level of the bladder and the uterus was exposed.  The bladder blade was inserted. A bladder flap was created by incising the vesico-uterine reflection and the bladder blade was replaced. The uterus was scored in a transverse fashion with a scalpel. The uterus was entered at the midline and the incision was extended laterally by stretching cephalocaudally. There was an expulsion of thin meconium fluid. The fetal head delivered followed by the shoulders and body onto the maternal abdomen.     Delayed cord clamping was performed for 45 seconds. The cord was clamped and cut, and the baby was handed to the pediatricians. Cord gasses were obtained. The placenta was removed via fundal massage and cord traction. The uterus was exteriorized and cleared of all clots and debris. The hysterotomy was repaired with suture of 0 Polysorb in a running locked fashion. Throughout the repair moderate tone was noted and methergine 0.2mg  IM was administered. A second imbricating layer of the same suture was placed and good hemostasis observed.     The uterus was returned to the abdominal cavity. Tone improved slowly and TXA 1g was administered. The abdominal cavity was cleared of all clots and debris. The uterine incision was reinspected and found to be hemostatic The subfascial layer was inspected and noted to be hemostatic. The fascia was then closed with 0 Polysorb in a running fashion from both directions.  The subcutaneous layer was irrigated, made hemostatic, and reapproximated with interrupted sutures of 0 Polysorb and 3-0 plain gut. The skin was closed with 0 Polysorb and 4-0 Monocryl in a subcuticular fashion.    Following the procedure, a vaginal sweep was  performed with no retained objects identified at the time of the exam. Misoprostol 870mcg was placed rectally. A fetal scalp electrode was not used.The patient tolerated the procedure well.      Sponge, instrument, and needle counts were correct.     The patient was taken to recovery in stable condition. The baby was transferred in stable condition to the unit.    SCOAP CHECKLIST 1-4 completed: yes    Kerrin Champagne, MD // [facility]

## 2019-12-12 NOTE — Anesthesia Procedure Notes (Signed)
Neuraxial Procedure    Block Type:  Procedure: CSE.  Level: L4-L5    Block Indication/Location:   Patient location during procedure: OB  Indication: labor pain  Pain location: abdomen and perineal    Staff:  Performing provider: Carolina Cellar, MD  Authorizing provider: Francee Gentile, Maxwell Marion, MD    Consent:  Risks discussed: block failure or incomplete block, motor weakness, nerve injury, bleeding/hematoma, infection and headache  Obtained by: Carolina Cellar, MD  Obtained from: patient    Neuraxial Checklist:  Patient identifed with two distinct identifiers, Procedural consent reviewed, Block consent confirmed, Allergies reviewed, Anticoagulation status reviewed, Procedural consent reviewed, Emergency drugs available, Medication syringes labeled and Monitoring in place.    Preparation:  Position: sitting  Patient sedation: fully conscious  Monitors: SpO2, NIBP and fetal monitor  Skin Prep: chlorhexidine  Sterile Barriers: sterile gloves  Skin local anesthetic (LA) used: injected    Ultrasound guided: No    Epidural Needle:  Needle type: Tuohy  Needle gauge: 17 G  Needle length: 3.5 in  Approach: midline  Guidance: loss of resistance to saline  Paresthesia: no  Loss of resistance: 6 cm  Dural puncture epidural: No    Spinal needle:  Needle type: Whitacre  Needle gauge: 25 G  Needle length: 130 mm  Guidance: landmark  Paresthesia: no  Aspiration: CSF    Catheter:  Catheter type: soft (wire coil)  Catheter size: 19 G  Paresthesia: no  Depth at skin: 11 cm  Aspiration: negative    Date and time placed:   12/12/2019 5:31 AM    Assessment:   Pain relief: patient satisfied with pain relief from block  Complications: none

## 2019-12-12 NOTE — Nursing Note (Signed)
Patient Summary  GBS neg, CSE and Foley cath in place. FHTs mainly cat 1 - 2, intermittent lates. Pt AROM ed with mec, on pitocin titrating to effective labor pattern.   Edited by: Wanda Plump at 12/12/2019 1835    Illness Severity  Pt is a G2P0 @ 38.2 came in for spontaneous labor.  Edited by: Kirtland Bouchard. Johnell Comings RN     K. Johnell Comings RN working with Judie Petit. Woody as Building control surveyor. See handoff for plan.

## 2019-12-12 NOTE — Progress Notes (Signed)
Patient called back at 0025, stating her contractions are increasing and would like to come in for a SVE. Reports positive fetal movement, denies LOF or VB.

## 2019-12-12 NOTE — Progress Notes (Signed)
BRIEF LABOR UPDATE NOTE     Sonya Martin") - DOB: 1991/01/31 (29 year old female)  Gender Identity: Female  Preferred Pronouns: patient's name, she/her/hers  Code Status: Full Code     Identification and Chief Complaint:  Sonya Martin is a 29 year old G1P0 at [redacted]w[redacted]d gestational age (EDD: 12/24/2019, by Ultrasound).     SUBJECTIVE   Patient reports that she comfortable.       OBJECTIVE     Vitals:    12/12/19 0844 12/12/19 0915 12/12/19 0945 12/12/19 1014   BP: 111/57 109/55 102/66 110/64   Pulse: 89 88 (!) 105 85   Resp:       Temp:       TempSrc:       SpO2:       Weight:       Height:           SVE Trend:  0114     3/80/-2  0305     4/90/-2  0810     6/90/-1  1025 7/90/-1    FHT: Baseline 150, moderate variability, 15x15 accelerations, no decelerations  Toco: q2-108min    PITOCIN: none     ASSESSMENT/PLAN   Category I tracing    #. Spontaneous labor:   - CSE in place and patient is comfortable  - Continue to monitor, expectant management  - Discussed AROM, patient prefers to wait at this time  - Consider AROM at future exam    #. FWB: Fetus reactive on monitor, cephalic EFW8#, GBSneg.   - Continuous external fetal monitoring

## 2019-12-12 NOTE — Anesthesia Preprocedure Evaluation (Signed)
Patient: Sonya Martin  Procedure Information    Date: 12/12/19  Procedure: LABOR CONSULT       OB/Gyn Evaluation  Previous Anesthesia history during deliveries     OB/Gyn    Patient is pregnant now.   Pregnancy Associated Diseases           Patient: Sonya Martin    HPI: 36F G1P0 at 38-2/7 weeks (EDD: 12/24/2019, by U/S) presents with spontaneous labor. Reports GERD that is controlled with omeprazole.    Relevant Problems   No relevant active problems       Relevant surgical history:   Past Surgical History:   Procedure Laterality Date   . NO PRIOR SURGERIES     Wisdom teeth extraction    {Quick Link  Update/review surgical history :999}    Medications:   Facility-Administered Medications as of 12/12/2019   Medication Dose Route Frequency Provider Last Rate Last Admin   . acetaminophen (Tylenol) tablet 1,000 mg  1,000 mg Oral q6h PRN Vertell Novak, MD       . famotidine (Pepcid) tablet 20 mg  20 mg Oral BID PRN Vertell Novak, MD       . lactated ringers infusion  0-999 mL/hr Intravenous Continuous Vertell Novak, MD 125 mL/hr at 12/12/19 0425 125 mL/hr at 12/12/19 0425   . lactated ringers IV Bolus 500 mL  500 mL Intravenous PRN Vertell Novak, MD 2,000 mL/hr at 12/12/19 0424 500 mL at 12/12/19 0424   . lidocaine PF (Xylocaine-MPF) 1 % injection ADS Override Pull            . omeprazole (PriLOSEC) DR capsule 20 mg  20 mg Oral Daily empty stomach Savitsky, Glendale Chard, MD       . ondansetron (Zofran) injection 4-8 mg  4-8 mg Intravenous q8h PRN Vertell Novak, MD       . ondansetron (Zofran) tablet 4-8 mg  4-8 mg Oral q8h PRN Vertell Novak, MD       . polyethylene glycol 3350 (Miralax) packet 17 g  17 g Oral Daily PRN Vertell Novak, MD       . senna (Senokot) tablet 8.6 mg  8.6 mg Oral BID PRN Vertell Novak, MD         Outpatient Medications as of 12/12/2019   Medication Sig Dispense Refill   . omeprazole 20 MG DR capsule  Take 1 capsule (20 mg) by mouth daily on an empty stomach. 30 capsule 2   . Prenatal Vit-Fe Fumarate-FA (prenatal mutlivitamin iron-folic acid) 29-1 MG tablet Take 1 tablet by mouth daily. 90 tablet 6       Review of patient's allergies indicates:  No Known Allergies    Social History:   Social History     Tobacco Use   . Smoking status: Former Games developer   . Smokeless tobacco: Never Used   Substance Use Topics   . Alcohol use: Not Currently   . Drug use: Not Currently       Medical History and Review of Systems  Documentation Reviewed: patient summary   Source of Information: In person visit and Chart reviewPrevious anesthesia: No   no family history of anesthetic complications:    Functional Status  able to climb 2 flights of stairs or more without stopping     Cardiovascular  neg cardio ROS  (-) past MI    Pulmonary  neg pulmonary ROS  (-) asthma  Neuro/Psych  neg neuro/psych ROS(+) , ,     HEENT  negative HEENT ROS    GI/Hepatic/Renal  (+) GERD well controlled,      Endo/Immunology  neg Endo/immunology ROS    Hematology  negative hematology ROS    Oncology  negative oncology ROS    Musculoskeletal  negative musculoskeletal ROS     Skin  negative skin ROS        Physical Exam  Airway  Mallampati:  III  Upper Lip Bite Test: I  TM distance:  >6 cm  Neck ROM:  Full  Mouth Opening:  Normal  Facial Hair:  None    Dental  normal      Cardiovascular  normal      Pulmonary  normal             Labs:   WBC (10*3/uL)   Date Value   12/12/2019 14.90 (H)     RBC (10*6/uL)   Date Value   12/12/2019 4.15     Hemoglobin (g/dL)   Date Value   12/12/2019 11.1 (L)     Hematocrit (%)   Date Value   12/12/2019 35 (L)     MCV (fL)   Date Value   12/12/2019 85     MCH (pg)   Date Value   12/12/2019 26.7 (L)     MCHC (g/dL)   Date Value   12/12/2019 31.5 (L)     Platelet Count (10*3/uL)   Date Value   12/12/2019 194     RDW-CV (%)   Date Value   12/12/2019 14.3     % Neutrophils (%)   Date Value   12/12/2019 84     % Lymphocytes (%)    Date Value   12/12/2019 11     % Monocytes (%)   Date Value   12/12/2019 3     % Eosinophils (%)   Date Value   12/12/2019 0     % Basophils (%)   Date Value   12/12/2019 0     % Immature Granulocytes (%)   Date Value   12/12/2019 2     Neutrophils (10*3/uL)   Date Value   12/12/2019 12.57 (H)     Absolute Lymphocyte Count (10*3/uL)   Date Value   12/12/2019 1.59     Monocytes (10*3/uL)   Date Value   12/12/2019 0.45     Absolute Eosinophil Count (10*3/uL)   Date Value   12/12/2019 0.00     Basophils (10*3/uL)   Date Value   12/12/2019 0.04     Immature Granulocytes (10*3/uL)   Date Value   12/12/2019 0.25 (H)       Anesthesia Plan    PAT Discussion  PAT Anesthesia Plan: regional  Assessment / Additional Concerns: Described neuraxial techniques for labor analgesia and the risks associated with them, including headaches, failed block, infection, bleeding, nerve injury, paralysis, hypotension, and pruritis. Also discussed the possibility of GA for urgent/emergent cesarean delivery. All questions were addressed. [name] is interested in an epidural for this delivery.    Supervising Provider - Day of Procedure

## 2019-12-12 NOTE — Procedures (Signed)
Sonya Martin, a G1P0 at [redacted]w[redacted]d with an EDD of 12/24/2019, by Ultrasound, was seen at [facility] for a nonstress test.    Nonstress Test:  Reason for NST: Contractions    Start Time: 0105  End Time: 0205    Baseline: 150 BPM  Variability: Moderate  Accelerations: Present 15x15 bpm  Decelerations: None    Acoustic Stimulator: No  Uterine Irritability: No    Contractions: Contractions: Irregular  Minutes Between Contractions: 2-5    Multiple Births: No    Interpretation:  Interpretation  Nonstress Test Interpretation: Reactive  2nd RN verification: N/A

## 2019-12-12 NOTE — Progress Notes (Signed)
PRE-CESAREAN DELIVERY NOTE    ID/CC: Sonya Martin is a 29 year old G1P0 at [redacted]w[redacted]d admitted for spontaneous labor.    S: Pushing well.     O:  Indications:  Non-reassuring fetal heart tones remote from delivery  Second stage arrest  Concern for cephalopelvic disproportion    Patient initially admitted with cervical exam 3/80/-2 that progressed to 4/90/-2 over 2 hours. She was initially managed expectantly, and at 1200 was thought to have progressed to 8/90/-1 so an amniotomy was performed for augmentation. After amniotomy, her cervix was noted on recheck to be 5/90/-1, and pitocin was started for additional augmentation. Seven hours later she was noted to have entered active labor and was 9/C/0. She then progressed along a normal labor curve, and one hour later was C/C/+2. She then pushed well for 2 hours with excellent maternal effort. Throughout the second stage of labor, the fetal heart tracing was concerning for variable and late decelerations that initially responded to uteroresuscitative measures, but after 2 hours of pushing began to have delayed return to baseline. Due to the concerning fetal heart tracing and concern for cephalopelvic disproportion with minimal descent despite excellent maternal effort, she was recommended to have a cesarean section. Consent was signed and all questions were answered.    PSH:  Past Surgical History:   Procedure Laterality Date   . NO PRIOR SURGERIES         Hct 35%    Placenta: posterior placenta    A/P:  #. Primary LTCS  - Discussed recommendation for cesarean delivery due to the above indications  - Discussed risks of cesarean delivery including bleeding, possible blood transfusion, infection, damage to nearby organs including bowel, bladder, and/or ureters  -Consent reviewed, signed, and all of the patient's questions were answered  -Pre-op Antibiotics: ancef 2g IV and azithromycin 500mg  IV 1 to OR  -Prophylaxis: SCDs  - No history of HTN or asthma

## 2019-12-12 NOTE — Progress Notes (Signed)
BRIEF LABOR UPDATE NOTE     Lue Sykora Jobe Marker") - DOB: 1991/01/27 (29 year old female)  Gender Identity: Female  Preferred Pronouns: patient's name, she/her/hers  Code Status: Full Code     Identification and Chief Complaint:  Ms. Horvath is a 29 year old G1P0 at [redacted]w[redacted]d gestational age (EDD: 12/24/2019, by Ultrasound).     SUBJECTIVE   Comfortable, resting       OBJECTIVE     Vitals:    12/12/19 1422 12/12/19 1516 12/12/19 1545 12/12/19 1615   BP:  115/63 120/73 135/62   Pulse:  97 86 90   Resp:       Temp: 37.1 C 36.8 C     TempSrc:       SpO2:       Weight:       Height:           SVE Trend:  01143/80/-2  03054/90/-2  08106/90/-1  10257/90/-1  12008/90/-1AROM, light meconium staining  1404     5/90/-1, new examiner, additional gush of fluids on exam  1645 5/90/-1    FHT: Baseline 145, moderate variability, 15x15 accelerations, with an occasional variable deceleration  Toco: q2-55min    PITOCIN: 4 milliunits/min     ASSESSMENT/PLAN   CategoryIItracing, but overall reassuring    #. Spontaneous labor:  -CSE in place and patient is comfortable  - Continue to monitor, expectant management  - s/p AROM  - Uptitrate pitocin    #. FWB: Fetus reactive on monitor, cephalic EFW8#, GBSneg.   - Continuous external fetal monitoring

## 2019-12-12 NOTE — Progress Notes (Signed)
BRIEF LABOR UPDATE NOTE     Bobie Caris Jobe Marker") - DOB: 05-01-1991 (29 year old female)  Gender Identity: Female  Preferred Pronouns: patient's name, she/her/hers  Code Status: Full Code     Identification and Chief Complaint:  Ms. Chahal is a 29 year old G1P0 at [redacted]w[redacted]d gestational age (EDD: 12/24/2019, by Ultrasound).     SUBJECTIVE   Ms. Traore reports that she feels comfortable, and feels some pressure during contractions.       OBJECTIVE     Vitals:    12/12/19 1215 12/12/19 1308 12/12/19 1314 12/12/19 1348   BP: 97/55  104/59    Pulse: 80  82    Resp:   18    Temp:  36.6 C  36.8 C   TempSrc:       SpO2:       Weight:       Height:           SVE Trend:  0114     3/80/-2  0305     4/90/-2  0810     6/90/-1  1025     7/90/-1  1200     8/90/-1  AROM, light meconium staining  1404 5/90/-1, new examiner, additional gush of fluids on exam    FHT: Baseline 145, moderate variability, 15x15 accelerations, without decelerations  Toco: q2-31min    PITOCIN: 0 milliunits/min     ASSESSMENT/PLAN   Category I tracing     #. Spontaneous labor:   - CSE in place and patient is comfortable  - s/p AROM  - SVE unchanged on recheck. Of note cervix soft however without contraction only dilated to 5cm on recheck with new examiner; start IV pitocin for augmentation     #. FWB: Fetus reactive on monitor, cephalic EFW 8#, GBS neg.    - Continuous external fetal monitoring

## 2019-12-12 NOTE — Nursing Note (Signed)
Patient Summary  GBS neg, CSE @ 0531, pain well controlled. CE @ 0305 4/90/-2.  Foley placed. FHTs mainly cat 1 145 baseline, +accels, intermittent lates.   Edited by: Donnamarie Poag, RN at 12/12/2019 [phone]    Illness Severity  Pt is a G2P0 @ 38.2 came in for spontaneous labor.  Edited by: Donnamarie Poag, RN at 12/12/2019 [phone]

## 2019-12-13 ENCOUNTER — Encounter (HOSPITAL_COMMUNITY): Payer: Self-pay | Admitting: Obstetrics/Gynecology

## 2019-12-13 LAB — CBC, DIFF
% Basophils: 0 %
% Eosinophils: 0 %
% Immature Granulocytes: 1 %
% Lymphocytes: 5 %
% Monocytes: 5 %
% Neutrophils: 89 %
% Nucleated RBC: 0 %
Absolute Eosinophil Count: 0 10*3/uL (ref 0.00–0.50)
Absolute Lymphocyte Count: 1.11 10*3/uL (ref 1.00–4.80)
Basophils: 0.04 10*3/uL (ref 0.00–0.20)
Hematocrit: 33 % — ABNORMAL LOW (ref 36–45)
Hemoglobin: 10.2 g/dL — ABNORMAL LOW (ref 11.5–15.5)
Immature Granulocytes: 0.19 10*3/uL — ABNORMAL HIGH (ref 0.00–0.05)
MCH: 27.2 pg — ABNORMAL LOW (ref 27.3–33.6)
MCHC: 31.4 g/dL — ABNORMAL LOW (ref 32.2–36.5)
MCV: 87 fL (ref 81–98)
Monocytes: 0.96 10*3/uL — ABNORMAL HIGH (ref 0.00–0.80)
Neutrophils: 19.24 10*3/uL — ABNORMAL HIGH (ref 1.80–7.00)
Nucleated RBC: 0 10*3/uL
Platelet Count: 176 10*3/uL (ref 150–400)
RBC: 3.75 10*6/uL — ABNORMAL LOW (ref 3.80–5.00)
RDW-CV: 14.5 % — ABNORMAL HIGH (ref 11.6–14.4)
WBC: 21.54 10*3/uL — ABNORMAL HIGH (ref 4.3–10.0)

## 2019-12-13 LAB — CBC (HEMOGRAM)
Hematocrit: 28 % — ABNORMAL LOW (ref 36–45)
Hemoglobin: 9.1 g/dL — ABNORMAL LOW (ref 11.5–15.5)
MCH: 27.5 pg (ref 27.3–33.6)
MCHC: 32.3 g/dL (ref 32.2–36.5)
MCV: 85 fL (ref 81–98)
Platelet Count: 174 10*3/uL (ref 150–400)
RBC: 3.31 10*6/uL — ABNORMAL LOW (ref 3.80–5.00)
RDW-CV: 14.6 % — ABNORMAL HIGH (ref 11.6–14.4)
WBC: 18.37 10*3/uL — ABNORMAL HIGH (ref 4.3–10.0)

## 2019-12-13 LAB — GLUCOSE POC, ~~LOC~~: Glucose (POC): 103 mg/dL (ref 62–125)

## 2019-12-13 MED ORDER — ONDANSETRON HCL 4 MG/2ML IJ SOLN
4.0000 mg | Freq: Once | INTRAMUSCULAR | Status: DC | PRN
Start: 2019-12-13 — End: 2019-12-14

## 2019-12-13 MED ORDER — MISOPROSTOL 100 MCG OR TABS
ORAL_TABLET | ORAL | Status: DC | PRN
Start: 2019-12-13 — End: 2019-12-14
  Administered 2019-12-13: 800 ug via RECTAL

## 2019-12-13 MED ORDER — MORPHINE SULFATE (PF) 2 MG/ML IV/IJ SOLN WRAPPER
2.0000 mg | Status: AC | PRN
Start: 2019-12-13 — End: 2019-12-13

## 2019-12-13 MED ORDER — OXYCODONE HCL 5 MG OR TABS
5.0000 mg | ORAL_TABLET | ORAL | Status: DC | PRN
Start: 2019-12-13 — End: 2019-12-14
  Administered 2019-12-13 – 2019-12-14 (×5): 5 mg via ORAL
  Filled 2019-12-13 (×5): qty 1

## 2019-12-13 MED ORDER — METOCLOPRAMIDE HCL 5 MG/ML IJ SOLN
5.0000 mg | INTRAMUSCULAR | Status: DC | PRN
Start: 2019-12-13 — End: 2019-12-14

## 2019-12-13 MED ORDER — NALBUPHINE HCL 20 MG/ML IJ SOLN
5.0000 mg | INTRAMUSCULAR | Status: DC | PRN
Start: 2019-12-13 — End: 2019-12-14

## 2019-12-13 MED ORDER — MORPHINE SULFATE (PF) 2 MG/ML IV/IJ SOLN WRAPPER
2.0000 mg | Freq: Once | Status: DC | PRN
Start: 2019-12-13 — End: 2019-12-14

## 2019-12-13 MED ORDER — ACETAMINOPHEN 500 MG OR TABS
1000.0000 mg | ORAL_TABLET | Freq: Four times a day (QID) | ORAL | Status: DC
Start: 2019-12-13 — End: 2019-12-14
  Administered 2019-12-13 (×4): 1000 mg via ORAL
  Administered 2019-12-14: 650 mg via ORAL
  Administered 2019-12-14 (×2): 1000 mg via ORAL
  Filled 2019-12-13 (×6): qty 2

## 2019-12-13 MED ORDER — OXYCODONE HCL 5 MG OR TABS
5.0000 mg | ORAL_TABLET | ORAL | Status: DC | PRN
Start: 2019-12-13 — End: 2019-12-14

## 2019-12-13 MED ORDER — NALOXONE HCL 0.4 MG/ML IJ SOLN
0.0400 mg | INTRAMUSCULAR | Status: DC | PRN
Start: 2019-12-13 — End: 2019-12-14

## 2019-12-13 MED ORDER — IBUPROFEN 600 MG OR TABS
600.0000 mg | ORAL_TABLET | Freq: Four times a day (QID) | ORAL | Status: DC
Start: 2019-12-13 — End: 2019-12-14
  Administered 2019-12-13 – 2019-12-14 (×6): 600 mg via ORAL
  Filled 2019-12-13 (×6): qty 1

## 2019-12-13 NOTE — Addendum Note (Signed)
Addendum  created 12/13/19 6734 by Nelly Rout, MD    [facility] administration accepted

## 2019-12-13 NOTE — Anesthesia Postprocedure Evaluation (Signed)
Patient: Natale Barba    Procedure Summary     Date: 12/12/19 Room / Location: [facility] L&D 1 / [name] L&D OR    Anesthesia Start: 0520 Anesthesia Stop: 12/13/19 0011    Procedure: CESAREAN SECTION (N/A Abdomen) Diagnosis:       Prolonged second stage of labor      (Prolonged second stage of labor [O63.1])    Surgeons: Maralyn Sago, MD Responsible Provider: Glynn Octave, MD    Anesthesia Type: regional ASA Status: 2        Final Anesthesia Type: regional    Vitals Value Taken Time   BP 131/64 12/13/19 0018   Temp 36.3 C 12/13/19 0013   Pulse 89 12/13/19 0018   SpO2 96 % 12/13/19 0016   Vitals shown include unvalidated device data.    Place of evaluation: ward    Patient participation: patient participated    Level of consciousness: fully conscious    Patient pain control satisfaction: patient is satisfied with level of pain control    Airway patency: patent    Cardiovascular status during assessment: stable    Respiratory status during assessment: breathing comfortably    Anesthetic complications: no    Intravascular volume status assessment: euvolemic    Nausea / vomiting: patient is not experiencing nausea      Planned post-operative disposition at time of assessment: ward care

## 2019-12-13 NOTE — Nursing Note (Signed)
Patient Summary  GBS neg, CSE and Foley cath in place draining well, CS d/t NRFHTs. QBL 855. Methergine given x1 and 800 miso rectal. Pt tolerating water. Scant drainage on dressing, Fundus at umbilicus, firm and scant bleeding  Edited by: Donnamarie Poag, RN at 12/13/2019 0229    Illness Severity  Pt is a G2P0 @ 38.2 came in for spontaneous labor baby boy via CS.   Edited by: Donnamarie Poag, RN at 12/13/2019 [phone]

## 2019-12-13 NOTE — Nursing Note (Signed)
Illness Severity  Pt delivered via CS and is in Stable condition.      Patient Summary  Pt doing well. PP checks WNL, VSS. Pain managed with Tylenol, Ibuprofen and Duromorph.       Action Items  Continue to help with breastfeeding and monitor PP status and pain.      Situational Awareness  Husband is at [facility] and helpful

## 2019-12-13 NOTE — Progress Notes (Signed)
Postpartum Note     Analys [name]") - DOB: 1990/12/17 (29 year old female)  Gender Identity: Female  Preferred Pronouns: patient's name, she/her/hers  Code Status: Full Code     PRIMARY PROVIDER: Raynelle Chary [facility])    CHIEF CONCERN / IDENTIFICATION:  [name] is a 29 year old G1P1001 s/p C-Section, Low Transverse delivery on 12/12/2019 at Gestational Age: [redacted]w[redacted]d of a female infant.    Principal Problem:    Spontaneous labor  Active Problems:    Prolonged second stage of labor    Delivery of pregnancy by cesarean section    PRENATAL LABS:  Blood type: A POSITIVE  Rubella: Positive    SUBJECTIVE   Pain: No, well controlled  Lochia: minimal  Diet: taking regular diet, no nausea or vomiting  Voiding: foley in place  Ambulating: No: will dangle and ambulate today  Feeding: breastfeeding well  No flatus  Mood is good    OBJECTIVE     Vitals (Most recent)     T: 36.4 C  BP: 137/68  HR: 96   RR: 18  SpO2: 94 %  T range: Temp  Min: 36.4 C  Max: 37.1 C  Admit weight: 87.5 kg (193 lb) (12/12/19 0317)  Last weight: 87.5 kg (193 lb) (12/12/19 0317)     I&Os:    Intake/Output Summary (Last 24 hours) at 12/13/2019 0120  Last data filed at 12/12/2019 2348  Intake 2749.59 ml   Output 3505 ml   Net -755.41 ml       PHYSICAL EXAM:  healthy, alert, in no apparent distress  normal effort  Cardiovascular: normal  Abdomen: soft, appropriately tender   Fundus: firm, 1 cm below umbilicus  Extremities: no edema  Incision: dressing clean, dry, intact    Preop Hct: 35  EBL:  Postop Hct: pending    ASSESSMENT/PLAN   Postpartum day #1:    #. Routine postpartum management:  - RN teaching on infant care  - Rhogam: not indicated // Rubella: not indicated  - Tdap rec'd // Flu declined x2  - Breastfeeding: initiated  - Depression risk: average  - Perineal care: routine  - VTE Prophylaxis: SCDs, ambulation, Lovenox    #. Post-operative care: tolerating diet, No flatus, pain controlled on PO pain meds.  - General  diet  - Foley out POD#1 am, UO adequate.  - Ambulate TID POD#1    #. Contraception: condoms     # Dispo: Anticipate discharge to home when meeting postop goals likely POD2-3. Routine discharge instructions given.  Follow up postpartum in 6 weeks with Dr. Lorie Apley at [facility] clinic.

## 2019-12-13 NOTE — Nursing Note (Signed)
Patient Summary  Pt doing well. PP checks WNL, VSS. Pain managed with Tylenol, Ibuprofen, oxycodone and Duromorph. Tolerating PO intake but pt has not passed gas yet. Foley removed at 1700, has not voided yet. Ambulated hallways with staff supervision. Breastfeeding going well, was seen by [facility] for assistance with positioning. Husband at bedside, supportive with cares.  Edited by: Vennie Homans, RN at 12/13/2019 1816    Illness Severity  Pt delivered via CS and is in Stable condition.  Edited by: Devra Dopp P at 12/13/2019 [phone]

## 2019-12-14 ENCOUNTER — Other Ambulatory Visit (HOSPITAL_BASED_OUTPATIENT_CLINIC_OR_DEPARTMENT_OTHER): Payer: Self-pay

## 2019-12-14 MED ORDER — WITCH HAZEL-GLYCERIN EX PADS
1.0000 | MEDICATED_PAD | Freq: Two times a day (BID) | CUTANEOUS | 0 refills | Status: AC | PRN
Start: 2019-12-14 — End: ?
  Filled 2019-12-14: qty 40, 20d supply, fill #0

## 2019-12-14 MED ORDER — POLYETHYLENE GLYCOL 3350 17 GM/SCOOP OR POWD
17.0000 g | Freq: Every day | ORAL | 0 refills | Status: AC
Start: 2019-12-14 — End: ?
  Filled 2019-12-14: qty 510, 30d supply, fill #0

## 2019-12-14 MED ORDER — OXYCODONE HCL 5 MG OR TABS
5.0000 mg | ORAL_TABLET | ORAL | 0 refills | Status: AC | PRN
Start: 2019-12-14 — End: ?
  Filled 2019-12-14: qty 10, 2d supply, fill #0

## 2019-12-14 MED ORDER — ACETAMINOPHEN 500 MG OR TABS
500.0000 mg | ORAL_TABLET | Freq: Four times a day (QID) | ORAL | 0 refills | Status: AC
Start: 2019-12-14 — End: ?
  Filled 2019-12-14: qty 50, 7d supply, fill #0

## 2019-12-14 MED ORDER — SENNOSIDES 8.6 MG OR TABS
8.6000 mg | ORAL_TABLET | Freq: Two times a day (BID) | ORAL | 0 refills | Status: AC | PRN
Start: 2019-12-14 — End: ?
  Filled 2019-12-14: qty 30, 15d supply, fill #0

## 2019-12-14 MED ORDER — IBUPROFEN 600 MG OR TABS
600.0000 mg | ORAL_TABLET | Freq: Four times a day (QID) | ORAL | 0 refills | Status: AC
Start: 2019-12-14 — End: ?
  Filled 2019-12-14: qty 50, 13d supply, fill #0

## 2019-12-14 NOTE — Discharge Instructions (Signed)
Discharge Instructions for Cesarean Section (C-Section)  You had a cesarean section, or C-section. During the C-section, your baby was delivered through an incision in your stomach and uterus. Full recovery after a C-section can take time. It’s important to take care of yourself -- for your own sake and because your new baby needs you. Here are some guidelines to follow at home.    General  Don't be afraid to use your postoperative pain medications.  They will make your life easier in the next few days to weeks.  If you use these medications as directed and for a short period of time it is extremely unlikely that you will become addicted to them.  It is likely that you will no longer need narcotic pain medications after 5-7 days, but certainly should no longer need them by 2 weeks.  If you are still experiencing pain that you feel is uncontrolled with Ibuprofen and Tylenol at appropriate dosing - please call our clinic to be seen.     Minimal/moderate vaginal bleeding/clots/discharge is normal for the first few weeks following cesarean section.  This will begin w/ red blood and then evolve to clots and eventually a thinner, more foul smelling discharge as your uterus cleans itself and returns to a non-pregnant state - if you begin bleeding heavily or having severe pain please call your OB provider's clinic immediately.    Many women experience mood disturbances following delivery.  You are experiencing major hormonal shifts, feeding and caring for an infant is exhausting, you just had major surgery, your body will take months to return to its pre-pregnant state, and you may be experiencing many other life stressors.  The range of emotions following delivery can range from feeling exhausted, overwhelmed, and mild "Baby Blues" to severe depression or even delusions and hallucinations.  If you are experiencing severe emotional swings or increasing sadness that are interfering with your ability to enjoy and bond with  your new baby - first realize that you are not alone and you are not crazy. Most women experience something like this following delivery. Please call your OB doctor, pediatrician, or family doctor and make an appointment to discuss these feelings.  We are here to help and support you.     Some women can develop preeclampsia (high blood pressure and protein in the urine) or even eclampsia (seizures) even after they deliver their baby.  It is important to watch closely for this.  If you have increasing swelling (in your hands, face, and legs), if you have a worsening headache or vision changes (particularly blurring or flashes of light), if you have pain in your upper belly or under your right lower ribcage, or if you check your blood pressure at home or at a pharmacy and it is higher than 140 / 90 - you should be seen immediately.  Call your provider and let them know if you have any of these symptoms as soon as possible.     Incision care  Here's how to take care of your incision:  · Shower as needed. Pat your incision dry.  · Watch your incision for signs of infection, like more redness or drainage.  · Hold a pillow against the incision when you laugh or cough and when you get up from a lying or sitting position.  · Remember, it can take as long as 6 weeks for your incision to heal.  You just had a major abdominal surgery.  It is normal to feel sore   for the next 2-4 weeks.  It is important that you do not lift more than 10-15 lbs for the next 2 weeks.  Your body will not feel completely back to normal for months.  It takes up to a year for your body to return to its pre-pregnant state.    Activity  Here are some suggestions:  · Don’t try to take care of anyone other than your baby and yourself.  · Remember, the more active you are, the more likely you are to have an increase in your bleeding.  · Get lots of rest. Take naps in the afternoon.  · Increase your activities bit by bit.  · Plan your activities so that you  don’t have to go up or down stairs more than needed, but it is safe to go up and down stairs.  · Do postsurgical deep breathing and coughing exercises. Ask your healthcare provider for instructions.  · Don’t lift anything heavier than your baby until your healthcare provider tells you it’s OK.  · Don’t drive until your healthcare provider says it’s OK.  · Nothing in the vagina for the next 4-6 weeks. No sex, no tampons, no douching, etc.  · Don’t have sexual intercourse until after you’ve had a checkup with your healthcare provider and you have decided on a birth control method. If you choose to have sex you can become pregnant immediately after delivery - even when breastfeeding.  · Allow others to do things for you. Don't hesitate to ask for help.    Follow-up  Make a follow-up appointment as directed by our staff. Generally we want to see anyone with diabetes or high blood pressure within 1 week of discharge; anyone at higher risk of postpartum depression within 2 weeks of discharge, and anyone else within 6 weeks of discharge. If you feel you should be seen sooner than is already scheduled, please give us a call and we can arrange a visit.    When to call your healthcare provider  Call your healthcare provider right away if you have any of these:  · Fever of 100.4°F (38°C) or higher  · Redness, pain, or drainage at your incision site  · Bleeding that requires a new sanitary pad every hour  · Severe pain in the abdomen  · Pain or urgency with urination  · Foul odor from vaginal discharge  · Trouble urinating or emptying your bladder  · No bowel movement within 1 week after the birth of your baby  · Swollen, red, painful area in the leg  · Appearance of rash or hives  · Sore, red, painful area on the breasts that may come with flu-like symptoms  · Feelings of anxiety, panic, and/or depression    Most importantly - Congratulations on your new baby!  Let us know if there's anything we can do to help you with your  new family member.

## 2019-12-14 NOTE — Nursing Note (Addendum)
Assumed care @ 2300 from [place], [place]    Patient Summary  Pt is well appearing. Progressing well. VSS.  Pain is reported as moderate. Pain medications are effective  breastfeeding well indep   Lactation Consult As needed  Zannah completed the [place] Postnatal Depression Scale today, with a score of 5. Postpartum mood issues discussed with Marchelle Folks and resources on this issue provided    Edited by: [place], RN at 12/14/2019 [phone]    Illness Severity  Stable 29 year old year old G2P1 post C-Section delivery on 12/12/19 of baby BOY, Renzo at 38+2 weeks.     Edited by: [place], RN at 12/14/2019 [phone]

## 2019-12-14 NOTE — Nursing Note (Signed)
Patient Summary  VSS and assessments stable. Incision clean/dry/intact. Steri-strips in place. Pain managed with Oxycodone, Tylenol and Ibuprofen. Pain rating of 6-7.DC planned for this evening.  Breastfeeding  infantt boy independently.    Illness Severity  Stable 29 year old year old G2P1 post C-Section delivery on 12/12/19 of baby boy at 38+2 weeks.

## 2019-12-14 NOTE — Discharge Summary (Signed)
Discharge Summary     Sonya Martin") - DOB: [birth year] (29 year old female)  Gender Identity: Female  Preferred Pronouns: patient's name, she/her/hers  PCP: Pcp, None   Code Status: Full Code     DATE OF ADMISSION: 12/12/2019  DATE OF DISCHARGE: 12/14/2019    PRIMARY OB PROVIDER: Charlette Caffey, MD    ADMISSION DIAGNOSIS:    Intrauterine pregnancy at 46w2dwith painful contractions    DISCHARGE DIAGNOSIS:  Cesarean delivery at Gestational Age: 5215w2dn 12/12/2019    HOSPITAL PROBLEM LIST:  Principal Problem:    Spontaneous labor  Active Problems:    Prolonged second stage of labor    Delivery of pregnancy by cesarean section  Resolved Problems:    Spontaneous onset of labor after 3790ut before 39 completed weeks gestation with delivery by planned cesarean section, delivered with mention of postpartum complication        DISCHARGE FOLLOW-UP VISITS/APPOINTMENTS:    [facility]  Future Appointments   Date Time Provider [place] 12/22/2019  4:20 PM [name], JaJaneann ForehandMD [facility]   12/29/2019  4:20 PM [name], JaJaneann ForehandMD [facility]   12/31/2019 10:00 AM [place] [facility]D INDUCTIONS 6EL L&D None   01/06/2020 10:10 AM QuCharmaine DownsMD [facility]   02/08/2020  1:20 PM Qu[facility]armaine DownsMD [facility]        Additional follow-up:  Other Follow-up:      Follow up with Dr. JaCharlette Caffeyn 2 and 6 weeks.      PENDING RESULTS THAT REQUIRE FOLLOW-UP (as of this summary):  Pending Labs     Order Current Status    PATHOLOGY, SURGICAL In process          ALLERGIES:  Patient has no known allergies.      DISCHARGE MEDICATIONS:   Current Discharge Medication List      START taking these medications    Details   acetaminophen 500 MG tablet Take 1-2 tablets (500-1,000 mg) by mouth every 6 hours.  Qty: 50 tablet, Refills: 0      ibuprofen 600 MG tablet Take 1 tablet (600 mg) by mouth every 6 hours.  Qty: 50 tablet, Refills: 0      oxyCODONE 5 MG tablet  Take 1 tablet (5 mg) by mouth every 3 hours as needed for moderate pain or severe pain.  Qty: 10 tablet, Refills: 0      polyethylene glycol 3350 17 GM/SCOOP oral powder Fill cap with powder to the 17 gram mark and dissolve in 8 ounces of water. Drink 1 time a day  Qty: 510 g, Refills: 0      senna 8.6 MG tablet Take 1 tablet (8.6 mg) by mouth 2 times a day as needed for constipation.  Qty: 30 tablet, Refills: 0      witch hazel-glycerin pads Apply topically to affected area every 12 hours as needed for pain.  Qty: 40 pad, Refills: 0         CONTINUE these medications which have NOT CHANGED    Details   Prenatal Vit-Fe Fumarate-FA (prenatal mutlivitamin iron-folic acid) 2925-3G tablet Take 1 tablet by mouth daily.  Qty: 90 tablet, Refills: 6    Comments: For Patients who are pregnant, post-partum, or considering conception: Fill with prenatal vitamin that is covered by insurance and contains iodine, iron, and at least 80664cg of folic acid.  Associated Diagnoses:  [redacted] weeks gestation of pregnancy         STOP taking these medications       omeprazole 20 MG DR capsule Comments:   Reason for Stopping:               BRIEF ADMISSION HISTORY:   Sonya Martin is a 29 year old, now G62P1011, at 68w2dgestational age (EDD: 12/24/2019, by Ultrasound) who presents with increasingly painful contractions.    HOSPITAL COURSE:   From the operative report by RKerrin Champagne MD, dated 12/12/2019, cesarean section is indicated for failure to progress in the second stage. The patient was taken to the OR and underwent a low transverse Cesarean section through a Pfannenstiel incision at Gestational Age: 2372w2dto deliver a female infant weighing 3.857 kg (8 lb 8.1 oz)  with APGARs 9 /9 . Operative findings are listed below. Estimated blood loss was 1000 mL, and her hematocrit fell from 35% to 33%, pre- to post-operatively.    Postpartum course was uncomplicated. By postoperative day #2, patient was ambulating, tolerating regular diet, voiding  spontaneously, pain was well controlled with oral pain medications, she was breastfeeding. She scored a 5 on the [place] Postnatal Depression Scale. She plans to discuss contraception at her postpartum OB visit. She received Tdap and declined Influenza prenatally and RhoGAM and MMR were not indicated.    DISPOSITION:    Final discharge disposition not confirmed     CONSULTS COMPLETED:    None     OPERATIONS/PROCEDURES:  Primary Low Transverse Cesarean Section through a Pfannenstiel incision - 12/12/2019  Placement of Combined Spinal Epidural - 12/12/2019    OPERATIVE FINDINGS:  Female infant, 3.857 kg, APGARs 9/9  Normal uterus, tubes, ovaries  Umbilical artery pH 7.7.82D 2.5  Umbilical vein pH 7.4.23D 1.2.     COMPLICATIONS: None    PATHOLOGY: Placenta: Routine discard    PHYSICAL EXAM:  Vitals:    12/13/19 1709 12/13/19 2100 12/14/19 0105 12/14/19 0530   BP: 112/71 121/81 112/75    BP Site: Right Arm  Left Arm Left Arm   Pulse: 76 94 89    Resp: '18 16 16 18   ' Temp: 36.7 C 36.6 C  36.6 C   TempSrc: Temporal Temporal Temporal Temporal   SpO2: 97% 99% 98%    Weight:       Height:           Intake/Output Summary (Last 24 hours) at 12/14/2019 [id number] data filed at 12/14/2019 0600  Gross per 24 hour   Intake --   Output 1225 ml   Net -1225 ml     Physical Exam   Constitutional: She is oriented to person, place, and time and well-developed, well-nourished, and in no distress.   Cardiovascular: Normal rate and regular rhythm.   No murmur heard.  Pulmonary/Chest: Effort normal and breath sounds normal. She has no wheezes.   Abdominal: Soft.   Incision closed, dry, and intact  Uterus firm  Uterine fundus inferior to the umbilicus   Musculoskeletal:         General: No edema.   Neurological: She is alert and oriented to person, place, and time.   Psychiatric: Affect normal.         Discharge Orders   Other Follow-up:     Instructions for follow-up: Follow up with Dr. JaCharlette Caffeyn 2 and 6 weeks.      Return to previous diet  Diet type: Return to previous diet              [facility] Medicine physicians mentioned in this note can be reached by calling [employer] at [phone]. If any part of this transcript is missing or to request other transcripts for this patient call [phone]. For online access to patient records enroll in [place] at [email or web address].

## 2019-12-14 NOTE — Progress Notes (Signed)
Illness Severity  Discharge      Patient Summary  Reviewed warning signs and also DC meds and instructions preior to DC         Action Items  Has follow up appointments in 2 and 6 wk pp           Situational Awareness  DC home with newborn

## 2019-12-15 ENCOUNTER — Encounter (HOSPITAL_BASED_OUTPATIENT_CLINIC_OR_DEPARTMENT_OTHER): Payer: PPO | Admitting: Unknown Physician Specialty

## 2019-12-16 LAB — PATHOLOGY, SURGICAL

## 2019-12-21 ENCOUNTER — Encounter (HOSPITAL_BASED_OUTPATIENT_CLINIC_OR_DEPARTMENT_OTHER): Payer: Self-pay | Admitting: Unknown Physician Specialty

## 2019-12-21 NOTE — Telephone Encounter (Signed)
Paid family medical leave paperwork attached  Asked pt to confirm leave dates

## 2019-12-22 ENCOUNTER — Encounter (HOSPITAL_BASED_OUTPATIENT_CLINIC_OR_DEPARTMENT_OTHER): Payer: PPO | Admitting: Unknown Physician Specialty

## 2019-12-22 ENCOUNTER — Encounter (HOSPITAL_BASED_OUTPATIENT_CLINIC_OR_DEPARTMENT_OTHER): Payer: Self-pay

## 2019-12-22 NOTE — Telephone Encounter (Signed)
Leave paperwork completed   Scanned and sent to patient

## 2019-12-28 NOTE — Progress Notes (Signed)
POSTPARTUM    ID/CC: 29 year old G94P1011 female here for 2 week postpartum visit after C-section at 38+2 on 12/12/19.    Subjective:  Date of delivery: Location: [place]   Mode of delivery/complications: C-section for prolonged second stage   Procedures: C-section     Patient denies excessive bleeding, vaginal discharge, pain, fever, difficulties.    She is breast feeding.    Mood is good. Good support from husband. Sleeping well.     Child: Singleton   Neonatal complications: none   Feeding: well, supplementing with formula   Difficulties with breastfeeding? None, has info for lactation consultant if needed   Other concerns re. child? no   Help in caring for baby? fob     Social   Relationship of father of baby to mother: husband   Father of baby supportive? yes   Number of other children in home: 0  Mother (patient) working outside of home? Not currently     Contraception   Has resumed intercourse? no      Maternal Labs:   Rubella immune   Rh positive   Last pap smear: 09/2016 NILM, due at 6 week  PP    Details of the patient's PMH, PSH, FamHx and Social Hx were reviewed and updated in Epic as appropriate.     Physical Exam:  1997   Detailed - 5-7 systems, Comprehensive -8+  BP 123/82    Pulse 76    Temp 36.8 C (Temporal)    Wt 78.6 kg (173 lb 3.2 oz)    LMP 03/12/2019 (Approximate)    BMI 29.73 kg/m   General: healthy, alert, no distress.  Respiratory: Normal respiratory effort and chest wall movement with respiration.   Abdomen: soft, non-tender, no HSM or masses. UT at umbi -2.  Psychiatric:   Mood/affect:  Normal.  Orientation: oriented to time, person and place  Neurologic:  Gait:  Normal.  Skin: Skin color, texture, turgor normal. No rashes or concerning lesions on visible areas.  Nodes: Inguinal areas: No adenopathy.  Incision: Well healed, no erythema or exudate.     Impression:  Reassuring postpartum exam.    Plan:  #Postpartum/Postop  -Recovering well   -Warning signs of PP depression reviewed  -Discussed  RTC precautions for bleeding, infection or incisional erythema     #Family Planning:  -Discussed use of condoms    #HCM:  -Pap due at 6 week PP visit  -Tdap given during pregnancy  -Flu vaccine patient declined     Follow-up for 6 week visit: Needs pap at next visit

## 2019-12-29 ENCOUNTER — Ambulatory Visit: Payer: PPO | Attending: Obstetrics & Gynecology | Admitting: Unknown Physician Specialty

## 2019-12-29 ENCOUNTER — Encounter (HOSPITAL_BASED_OUTPATIENT_CLINIC_OR_DEPARTMENT_OTHER): Payer: Self-pay | Admitting: Unknown Physician Specialty

## 2019-12-29 ENCOUNTER — Encounter (HOSPITAL_BASED_OUTPATIENT_CLINIC_OR_DEPARTMENT_OTHER): Payer: PPO | Admitting: Unknown Physician Specialty

## 2019-12-31 ENCOUNTER — Encounter (HOSPITAL_COMMUNITY): Payer: PPO

## 2020-01-06 ENCOUNTER — Encounter (HOSPITAL_BASED_OUTPATIENT_CLINIC_OR_DEPARTMENT_OTHER): Payer: PPO | Admitting: Unknown Physician Specialty

## 2020-01-19 NOTE — Progress Notes (Signed)
POSTPARTUM    ID/CC: 29 year old G29P1011 female here for 6 week postpartum visit after C-section at 38+2 on 12/12/19.    Subjective:  Date of delivery: Location: [place]   Mode of delivery/complications: C-section for prolonged second stage   Procedures: C-section     Patient doing well postpartum. She reports she is mainly back to her baseline. Pain is minimal, bleeding is minimal, no full menses yet but is intermittently pumping/breast feeding, no breast concerns, no fevers or chills.     Child: Singleton   Neonatal complications: none   Feeding: well, supplementing with formula   Difficulties with breastfeeding? None, has info for lactation consultant if needed   Other concerns re. child? no   Help in caring for baby? fob     Social   Relationship of father of baby to mother: husband   Father of baby supportive? yes   Number of other children in home: 0  Mother (patient) working outside of home? Not currently     Contraception   Has resumed intercourse? No      Maternal Labs:   Rubella immune   Rh positive   Last pap smear: 09/2016 NILM, due today    Details of the patient's PMH, PSH, FamHx and Social Hx were reviewed and updated in Epic as appropriate.     Physical Exam:  1995   Detailed - 5-7 systems, Comprehensive -8+  BP 106/69    Pulse 79    Wt 76.2 kg (168 lb)    LMP 03/12/2019 (Approximate)    BMI 28.84 kg/m   General: healthy, alert, no distress.  Respiratory: Normal respiratory effort and chest wall movement with respiration.   Abdomen: soft, non-tender, no HSM or masses.   Pelvic: normal external genitalia, well rugated vaginal epithelium, no visible vaginal abrasions, cervix without abnormal lesions visually slightly dilated,physiologic discharge. Bimanual exam: Mobile uterus without palpable masses, no palpable adnexal masses, no CMT or fundal tenderness. Cervical cancer screen completed.   Breasts: normal without suspicious masses, skin or nipple changes or axillary nodes, nipples normal without  inversion, lesions or discharge and no skin dimpling or peau d'orange.  Incision: Well healed, no erythema or exudate.     Impression:  Reassuring postpartum exam.    Plan:  #Postpartum/Postop  -Recovering well   -Warning signs of PP depression reviewed  -Discussed RTC precautions for bleeding, infection or incisional erythema   -OK to resume intercourse. Discussed use of lubrication    #Family Planning:  -Discussed use of condoms    #HCM:  -Pap collected today  -Tdap given during pregnancy  -Flu vaccine patient declined     Follow-up PRN

## 2020-01-20 ENCOUNTER — Ambulatory Visit: Payer: PPO | Attending: Obstetrics & Gynecology | Admitting: Unknown Physician Specialty

## 2020-01-20 DIAGNOSIS — Z124 Encounter for screening for malignant neoplasm of cervix: Secondary | ICD-10-CM

## 2020-01-22 NOTE — Progress Notes (Signed)
I saw and evaluated the patient and agree with Dr.[name]'s note.

## 2020-01-24 LAB — HPV REFLEX TO PAP
HPV 16 Genotype: NEGATIVE
HPV 18 Genotype: NEGATIVE
High Risk HPV Screening: NEGATIVE
Other High Risk HPV Genotype: NEGATIVE

## 2020-02-02 ENCOUNTER — Encounter (HOSPITAL_BASED_OUTPATIENT_CLINIC_OR_DEPARTMENT_OTHER): Payer: Self-pay | Admitting: Unknown Physician Specialty

## 2020-02-02 NOTE — Progress Notes (Signed)
COVID-19 Antibody Testing Results: NEGATIVE    Your blood was tested for COVID-19 antibodies as part of a COVID-19 Seroprevalence in Pregnancy Study.    Your result was NEGATIVE. The test was run on blood collected on: 12/12/19    What does a NEGATIVE result mean?  This means you likely have not had a COVID-19 infection over the past few months at the time your blood was drawn.   Depending on when your test was performed, it could also be too early to show antibodies.   In certain cases, such as those patients who are immunocompromised, a negative test result may be due to a delayed antibody response or there may not be detectable levels of antibodies.      Do I still need to practice social distancing and other precautions?  Yes, you should still continue social distancing, practicing good hand hygiene, and wearing a mask when social distancing is not possible.      Are my test results in my medical record?  Because this test was does for research, your results are not in the normal place in your medical record where test results for clinical care are placed.   That is why we are using this notice to tell you about your test results.      Further questions? Email the study team at [email or web address] or call them at [phone] with questions or concerns.     We may also contact you by phone or email in the future to ask you to take part in other research on COVID-19 and pregnancy.    Thank you for being a part of this study.

## 2020-02-08 ENCOUNTER — Ambulatory Visit (HOSPITAL_BASED_OUTPATIENT_CLINIC_OR_DEPARTMENT_OTHER): Payer: PPO | Admitting: Unknown Physician Specialty

## 2020-03-27 MED FILL — METFORMIN HCL 500 MG TABS: 500 | 30 days supply | Qty: 60 | Fill #0

## 2020-05-01 MED FILL — METFORMIN HCL 500 MG TABS: 500 | 31 days supply | Qty: 120 | Fill #0

## 2020-06-26 ENCOUNTER — Telehealth: Payer: PRIVATE HEALTH INSURANCE | Admitting: Family Medicine

## 2020-06-27 ENCOUNTER — Telehealth (INDEPENDENT_AMBULATORY_CARE_PROVIDER_SITE_OTHER): Payer: 59 | Admitting: Family Medicine

## 2020-06-27 ENCOUNTER — Encounter: Payer: Self-pay | Admitting: Family Medicine

## 2020-06-27 ENCOUNTER — Other Ambulatory Visit: Payer: Self-pay | Admitting: Family Medicine

## 2020-06-27 VITALS — Ht 64.02 in | Wt 279.0 lb

## 2020-06-27 DIAGNOSIS — F32A Depression, unspecified: Secondary | ICD-10-CM

## 2020-06-27 DIAGNOSIS — F419 Anxiety disorder, unspecified: Secondary | ICD-10-CM | POA: Diagnosis not present

## 2020-06-27 MED ORDER — FLUOXETINE HCL 20 MG PO TABS: 20.0000 mg | ORAL_TABLET | Freq: Every day | ORAL | 3 refills | Status: DC

## 2020-06-27 MED FILL — FLUOXETINE HCL 20 MG TABS: 20 | 30 days supply | Qty: 30 | Fill #0
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
## 2020-06-27 NOTE — Progress Notes (Signed)
I connected with  Brittany Lindsey on 06/27/20 by a video enabled telemedicine application and verified that I am speaking with the correct person using two identifiers.   I discussed the limitations of evaluation and management by telemedicine. The patient expressed understanding and agreed to proceed.

## 2020-06-28 ENCOUNTER — Other Ambulatory Visit (HOSPITAL_COMMUNITY): Payer: Self-pay | Admitting: Obstetrics and Gynecology

## 2020-06-28 MED FILL — METFORMIN HCL 500 MG TABS: 500 | 30 days supply | Qty: 120 | Fill #0

## 2020-06-29 ENCOUNTER — Other Ambulatory Visit (HOSPITAL_COMMUNITY): Payer: Self-pay | Admitting: Obstetrics and Gynecology

## 2020-07-09 ENCOUNTER — Telehealth: Payer: PRIVATE HEALTH INSURANCE | Admitting: Family Medicine

## 2020-07-26 MED FILL — FLUOXETINE HCL 20 MG TABS: 20 | 30 days supply | Qty: 30 | Fill #1

## 2020-08-30 ENCOUNTER — Encounter: Payer: Self-pay | Admitting: Family Medicine

## 2020-08-30 ENCOUNTER — Other Ambulatory Visit: Payer: Self-pay | Admitting: Family Medicine

## 2020-08-30 MED ORDER — FLUOXETINE HCL 40 MG PO CAPS: 40.0000 mg | ORAL_CAPSULE | Freq: Every day | ORAL | 3 refills | Status: DC

## 2020-08-30 MED FILL — FLUoxetine HCL 40 MG CAPS: 40 | 30 days supply | Qty: 30 | Fill #0

## 2020-09-06 ENCOUNTER — Encounter: Payer: Self-pay | Admitting: Family Medicine

## 2020-09-10 ENCOUNTER — Encounter: Payer: Self-pay | Admitting: Physician Assistant

## 2020-09-10 ENCOUNTER — Telehealth (INDEPENDENT_AMBULATORY_CARE_PROVIDER_SITE_OTHER): Payer: BLUE CROSS/BLUE SHIELD | Admitting: Physician Assistant

## 2020-09-10 ENCOUNTER — Other Ambulatory Visit: Payer: Self-pay

## 2020-09-10 DIAGNOSIS — Z7689 Persons encountering health services in other specified circumstances: Secondary | ICD-10-CM | POA: Diagnosis not present
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
## 2020-09-10 NOTE — Progress Notes (Signed)
I have discussed the procedure for the virtual visit with the patient who has given consent to proceed with assessment and treatment.   Roxann Vierra S Auriana Scalia, CMA     

## 2020-10-02 MED FILL — FLUoxetine HCL 40 MG CAPS: 40 | 30 days supply | Qty: 30 | Fill #0

## 2020-10-02 MED FILL — METFORMIN HCL 500 MG TABS: 500 | 30 days supply | Qty: 120 | Fill #0

## 2020-10-22 ENCOUNTER — Encounter: Payer: Self-pay | Admitting: Family Medicine

## 2020-10-24 ENCOUNTER — Emergency Department (HOSPITAL_COMMUNITY)
Admission: EM | Admit: 2020-10-24 | Discharge: 2020-10-24 | Disposition: A | Discharge status: Home / Self Care | Payer: BLUE CROSS/BLUE SHIELD | Attending: Emergency Medicine | Admitting: Emergency Medicine

## 2020-10-24 ENCOUNTER — Encounter (HOSPITAL_COMMUNITY): Payer: Self-pay

## 2020-10-24 ENCOUNTER — Other Ambulatory Visit: Payer: Self-pay

## 2020-10-24 ENCOUNTER — Other Ambulatory Visit (HOSPITAL_COMMUNITY): Payer: Self-pay | Admitting: Emergency Medicine

## 2020-10-24 DIAGNOSIS — R7303 Prediabetes: Secondary | ICD-10-CM | POA: Diagnosis not present

## 2020-10-24 DIAGNOSIS — Z7984 Long term (current) use of oral hypoglycemic drugs: Secondary | ICD-10-CM | POA: Diagnosis not present

## 2020-10-24 DIAGNOSIS — Z87891 Personal history of nicotine dependence: Secondary | ICD-10-CM | POA: Diagnosis not present

## 2020-10-24 DIAGNOSIS — U071 COVID-19: Secondary | ICD-10-CM | POA: Insufficient documentation

## 2020-10-24 DIAGNOSIS — R509 Fever, unspecified: Secondary | ICD-10-CM | POA: Diagnosis present

## 2020-10-24 MED ORDER — ONDANSETRON 4 MG PO TBDP: ORAL_TABLET | ORAL | 0 refills | Status: DC

## 2020-10-24 MED ORDER — BENZONATATE 100 MG PO CAPS: 100.0000 mg | ORAL_CAPSULE | Freq: Three times a day (TID) | ORAL | 0 refills | Status: DC

## 2020-10-24 MED FILL — BENZONATATE 100 MG CAPS: 100 | 7 days supply | Qty: 21 | Fill #0

## 2020-10-24 MED FILL — ONDANSETRON ODT 4 MG TABLET: 4 | 2 days supply | Qty: 18 | Fill #0

## 2020-10-24 NOTE — Discharge Instructions (Signed)
Take tylenol 2 pills 4 times a day and motrin 4 pills 3 times a day.  Drink plenty of fluids.  Return for worsening shortness of breath, headache, confusion. Follow up with your family doctor.   

## 2020-10-24 NOTE — ED Notes (Signed)
Patient ambulated in room with pulse ox, 94-100% RA, denies SOB w/ ambulation, able to speak in full sentences.

## 2020-10-24 NOTE — ED Triage Notes (Signed)
Pt presents with c/o fever. Pt reports she tested positive for Covid on 2/7 and has not been able to get her fever to break at home.
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
## 2020-10-25 ENCOUNTER — Telehealth: Payer: Self-pay | Admitting: Nurse Practitioner

## 2020-10-25 ENCOUNTER — Other Ambulatory Visit: Payer: Self-pay | Admitting: Family Medicine

## 2020-10-25 MED ORDER — ALBUTEROL SULFATE HFA 108 (90 BASE) MCG/ACT IN AERS: 2.0000 | INHALATION_SPRAY | Freq: Four times a day (QID) | RESPIRATORY_TRACT | 0 refills | Status: DC | PRN

## 2020-10-25 MED FILL — ALBUTEROL SULFATE HFA 108 (: 108 (90 BAS | 25 days supply | Qty: 18 | Fill #0

## 2020-10-25 NOTE — Telephone Encounter (Signed)
Pt was call to follow up on her ED Visit VM was full could not leave a message and Pt never answered the phone after 3 attempts

## 2020-10-25 NOTE — Progress Notes (Unsigned)
Albuterol inhaler sent.

## 2020-10-26 ENCOUNTER — Encounter: Payer: Self-pay | Admitting: Family Medicine

## 2020-11-02 ENCOUNTER — Encounter: Payer: Self-pay | Admitting: Family Medicine

## 2020-11-22 ENCOUNTER — Encounter: Payer: Self-pay | Admitting: Family Medicine

## 2020-11-23 ENCOUNTER — Telehealth: Payer: Self-pay | Admitting: Family Medicine

## 2020-11-23 NOTE — Telephone Encounter (Signed)
Encounter made in error - patient will see Dr. Beverely Low on Monday

## 2020-11-26 ENCOUNTER — Other Ambulatory Visit (HOSPITAL_COMMUNITY)
Admission: RE | Admit: 2020-11-26 | Discharge: 2020-11-26 | Disposition: A | Discharge status: Home / Self Care | Payer: BLUE CROSS/BLUE SHIELD | Source: Ambulatory Visit | Attending: Family Medicine | Admitting: Family Medicine

## 2020-11-26 ENCOUNTER — Encounter: Payer: Self-pay | Admitting: Family Medicine

## 2020-11-26 ENCOUNTER — Ambulatory Visit (INDEPENDENT_AMBULATORY_CARE_PROVIDER_SITE_OTHER): Payer: BLUE CROSS/BLUE SHIELD | Admitting: Family Medicine

## 2020-11-26 ENCOUNTER — Other Ambulatory Visit: Payer: Self-pay

## 2020-11-26 VITALS — BP 110/70 | HR 71 | Temp 97.9°F | Resp 16 | Ht 64.2 in | Wt 269.6 lb

## 2020-11-26 DIAGNOSIS — E282 Polycystic ovarian syndrome: Secondary | ICD-10-CM

## 2020-11-26 DIAGNOSIS — R202 Paresthesia of skin: Secondary | ICD-10-CM | POA: Diagnosis not present

## 2020-11-26 DIAGNOSIS — N644 Mastodynia: Secondary | ICD-10-CM | POA: Diagnosis not present

## 2020-11-26 DIAGNOSIS — Z113 Encounter for screening for infections with a predominantly sexual mode of transmission: Secondary | ICD-10-CM | POA: Diagnosis not present

## 2020-11-26 DIAGNOSIS — R200 Anesthesia of skin: Secondary | ICD-10-CM | POA: Diagnosis not present

## 2020-11-26 LAB — HEPATIC FUNCTION PANEL
ALT: 26 U/L (ref 0–35)
AST: 21 U/L (ref 0–37)
Albumin: 4.3 g/dL (ref 3.5–5.2)
Alkaline Phosphatase: 54 U/L (ref 39–117)
Bilirubin, Direct: 0.1 mg/dL (ref 0.0–0.3)
Total Bilirubin: 0.6 mg/dL (ref 0.2–1.2)
Total Protein: 7.4 g/dL (ref 6.0–8.3)

## 2020-11-26 LAB — VITAMIN D 25 HYDROXY (VIT D DEFICIENCY, FRACTURES): VITD: 16.18 ng/mL — ABNORMAL LOW (ref 30.00–100.00)

## 2020-11-26 LAB — LIPID PANEL
Cholesterol: 143 mg/dL (ref 0–200)
HDL: 41.2 mg/dL (ref >39.00)
LDL Cholesterol: 75 mg/dL (ref 0–99)
NonHDL: 101.61
Total CHOL/HDL Ratio: 3
Triglycerides: 135 mg/dL (ref 0.0–149.0)
VLDL: 27 mg/dL (ref 0.0–40.0)

## 2020-11-26 LAB — CBC WITH DIFFERENTIAL/PLATELET
Basophils Absolute: 0.1 10*3/uL (ref 0.0–0.1)
Basophils Relative: 1.2 % (ref 0.0–3.0)
Eosinophils Absolute: 0.1 10*3/uL (ref 0.0–0.7)
Eosinophils Relative: 1.3 % (ref 0.0–5.0)
HCT: 39.9 % (ref 36.0–46.0)
Hemoglobin: 13.5 g/dL (ref 12.0–15.0)
Lymphocytes Relative: 30.6 % (ref 12.0–46.0)
Lymphs Abs: 2 10*3/uL (ref 0.7–4.0)
MCHC: 33.8 g/dL (ref 30.0–36.0)
MCV: 83.5 fl (ref 78.0–100.0)
Monocytes Absolute: 0.4 10*3/uL (ref 0.1–1.0)
Monocytes Relative: 6.9 % (ref 3.0–12.0)
Neutro Abs: 3.9 10*3/uL (ref 1.4–7.7)
Neutrophils Relative %: 60 % (ref 43.0–77.0)
Platelets: 198 10*3/uL (ref 150.0–400.0)
RBC: 4.78 Mil/uL (ref 3.87–5.11)
RDW: 13.5 % (ref 11.5–15.5)
WBC: 6.5 10*3/uL (ref 4.0–10.5)

## 2020-11-26 LAB — BASIC METABOLIC PANEL
BUN: 14 mg/dL (ref 6–23)
CO2: 24 mEq/L (ref 19–32)
Calcium: 9.1 mg/dL (ref 8.4–10.5)
Chloride: 104 mEq/L (ref 96–112)
Creatinine, Ser: 0.68 mg/dL (ref 0.40–1.20)
GFR: 115.76 mL/min (ref >60.00)
Glucose, Bld: 108 mg/dL — ABNORMAL HIGH (ref 70–99)
Potassium: 3.7 mEq/L (ref 3.5–5.1)
Sodium: 135 mEq/L (ref 135–145)

## 2020-11-26 LAB — TSH: TSH: 2.7 u[IU]/mL (ref 0.35–4.50)

## 2020-11-26 LAB — B12 AND FOLATE PANEL
Folate: 8.8 ng/mL (ref >5.9)
Vitamin B-12: 657 pg/mL (ref 211–911)

## 2020-11-26 NOTE — Assessment & Plan Note (Signed)
Deteriorated.  Pt reports that in the last 2 weeks her nipples and areola have been much more painful.  Due to her PCOS, she has not had a period since 2019 but she had one on Saturday.  Since Saturday, the breast pain has almost completely resolved.  Suspect this recent worsening of sxs is hormonal but encouraged pt to keep a log of her sxs to determine if we can establish a pattern.  Pt expressed understanding and is in agreement w/ plan.

## 2020-11-26 NOTE — Assessment & Plan Note (Signed)
Ongoing issue for pt.  BMI is 45.99  She is not currently getting any regular exercise or following any particular diet.  Stressed need for healthy food choices and regular physical activity.  Check labs to risk stratify.  Will follow.

## 2020-11-26 NOTE — Patient Instructions (Signed)
Schedule your complete physical in 6 months We'll notify you of your lab results and make any changes if needed Make sure breasts are well supported to help decrease pain Keep a log to see if we can find any patterns related to the breast pain- when it happens, how long it lasts, any triggers (cold air, caffeine, etc) We'll call you with your GYN and Hand Specialist appts Continue to work on healthy diet and regular exercise- you can do it! Call with any questions or concerns Hang in there!

## 2020-11-26 NOTE — Progress Notes (Signed)
Subjective:    Patient ID: Brittany Lindsey, female    DOB: 1990/12/17, 30 y.o.   MRN: [id number]  HPI Painful nipples- pt reports 'this has increased a lot' a lot in the last 2 weeks.  Pain starts in nipple and then radiate outward to the areola.  [facility].  Imaging was unrevealing and she was told it was likely too much caffeine.  Pain will last 20-30 minutes.  No relief w/ heating pad.  Pain is mainly at night but they remain sensitive to touch.  Has issues wearing her seatbelt.  At times there will be redness and warmth.  This has been going on 'for years' but was worse recently.  She had her first period on Saturday since 2019 (PCOS) and breasts have not hurt since Saturday.  No redness or warmth at this time  Hand/wrist numbness- L greater than R (pt is L handed).  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  Continues to have numbness in tips of fingers.  Obesity- pt's BMI is 45.99  Pt is not following a particular diet, not getting any regular exercise.  STD screen- pt desires this today   Review of Systems For ROS see HPI   This visit occurred during the SARS-CoV-2 public health emergency.  Safety protocols were in place, including screening questions prior to the visit, additional usage of staff PPE, and extensive cleaning of exam room while observing appropriate contact time as indicated for disinfecting solutions.       Objective:   Physical Exam Vitals reviewed.  Constitutional:      General: She is not in acute distress.    Appearance: Normal appearance. She is obese. She is not ill-appearing.  HENT:     Head: Normocephalic and atraumatic.  Eyes:     Extraocular Movements: Extraocular movements intact.     Conjunctiva/sclera: Conjunctivae normal.     Pupils: Pupils are equal, round, and reactive to light.  Cardiovascular:     Rate and Rhythm: Normal rate and regular rhythm.     Pulses: Normal  pulses.     Heart sounds: Normal heart sounds.  Pulmonary:     Effort: Pulmonary effort is normal.     Breath sounds: Normal breath sounds.  Chest:  Breasts:     Right: No swelling, bleeding, inverted nipple, mass, nipple discharge, skin change or tenderness.     Left: Skin change (very dry areola) present. No swelling, bleeding, inverted nipple, mass, nipple discharge or tenderness.    Abdominal:     General: There is no distension.     Palpations: Abdomen is soft.     Tenderness: There is no abdominal tenderness. There is no guarding.  Musculoskeletal:        General: No swelling or deformity.     Cervical back: Normal range of motion and neck supple.     Comments: (-) phalen's bilaterally  Skin:    General: Skin is warm and dry.  Neurological:     General: No focal deficit present.     Mental Status: She is oriented to person, place, and time.  Psychiatric:        Mood and Affect: Mood normal.        Behavior: Behavior normal.        Thought Content: Thought content normal.           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  Wearing a wrist brace at night has improved her sxs but she continues to have some pain and numbness b/c she is not able to wear the braces during the day.  Will refer to hand specialist for evaluation and tx.  Pt expressed understanding and is in agreement w/ plan.

## 2020-11-26 NOTE — Assessment & Plan Note (Signed)
Pt is in need of new GYN.  Referral placed

## 2020-11-27 ENCOUNTER — Other Ambulatory Visit: Payer: Self-pay

## 2020-11-27 DIAGNOSIS — E559 Vitamin D deficiency, unspecified: Secondary | ICD-10-CM

## 2020-11-27 LAB — URINE CYTOLOGY ANCILLARY ONLY
Bacterial Vaginitis-Urine: NEGATIVE
Candida Urine: NEGATIVE
Chlamydia: POSITIVE — AB
Comment: NEGATIVE
Comment: NEGATIVE
Comment: NORMAL
Neisseria Gonorrhea: NEGATIVE
Trichomonas: NEGATIVE

## 2020-11-27 LAB — HIV ANTIBODY (ROUTINE TESTING W REFLEX): HIV 1&2 Ab, 4th Generation: NONREACTIVE

## 2020-11-27 LAB — RPR: RPR Ser Ql: NONREACTIVE

## 2020-11-27 MED ORDER — VITAMIN D (ERGOCALCIFEROL) 1.25 MG (50000 UNIT) PO CAPS
50000.0000 [IU] | ORAL_CAPSULE | ORAL | 0 refills | Status: DC
Start: 2020-11-27 — End: 2020-11-27

## 2020-11-27 MED FILL — VIT D2 1.25 MG (50,000 UNIT: 1.25 MG | 28 days supply | Qty: 4 | Fill #0

## 2020-11-28 ENCOUNTER — Encounter: Payer: Self-pay | Admitting: Family Medicine

## 2020-11-28 ENCOUNTER — Other Ambulatory Visit: Payer: Self-pay | Admitting: Family Medicine

## 2020-11-28 ENCOUNTER — Other Ambulatory Visit: Payer: Self-pay

## 2020-11-28 MED ORDER — DOXYCYCLINE HYCLATE 100 MG PO TABS: 100.0000 mg | ORAL_TABLET | Freq: Two times a day (BID) | ORAL | 0 refills | Status: DC

## 2020-11-28 MED FILL — DOXYCYCLINE HYCLATE 100 MG: 100 | 7 days supply | Qty: 14 | Fill #0

## 2020-11-28 NOTE — Progress Notes (Signed)
Prescription for Doxy sent to pharmacy

## 2020-11-29 ENCOUNTER — Other Ambulatory Visit: Payer: Self-pay

## 2020-11-29 DIAGNOSIS — N898 Other specified noninflammatory disorders of vagina: Secondary | ICD-10-CM

## 2020-11-29 DIAGNOSIS — Z113 Encounter for screening for infections with a predominantly sexual mode of transmission: Secondary | ICD-10-CM

## 2020-11-30 ENCOUNTER — Encounter (HOSPITAL_BASED_OUTPATIENT_CLINIC_OR_DEPARTMENT_OTHER): Payer: Self-pay

## 2020-12-10 ENCOUNTER — Other Ambulatory Visit: Payer: BLUE CROSS/BLUE SHIELD

## 2020-12-17 ENCOUNTER — Other Ambulatory Visit (HOSPITAL_COMMUNITY): Payer: Self-pay

## 2020-12-17 ENCOUNTER — Ambulatory Visit (HOSPITAL_BASED_OUTPATIENT_CLINIC_OR_DEPARTMENT_OTHER): Payer: BLUE CROSS/BLUE SHIELD | Admitting: Obstetrics & Gynecology

## 2020-12-17 MED FILL — Fluoxetine HCl Cap 40 MG: ORAL | 30 days supply | Qty: 30 | Fill #0 | Status: CN

## 2020-12-17 MED FILL — Metformin HCl Tab 500 MG: ORAL | 30 days supply | Qty: 120 | Fill #0 | Status: CN

## 2020-12-23 MED FILL — Ergocalciferol Cap 1.25 MG (50000 Unit): ORAL | 28 days supply | Qty: 4 | Fill #0 | Status: CN

## 2020-12-24 ENCOUNTER — Other Ambulatory Visit (HOSPITAL_COMMUNITY): Payer: Self-pay

## 2020-12-25 ENCOUNTER — Other Ambulatory Visit (HOSPITAL_COMMUNITY): Payer: Self-pay | Admitting: Obstetrics and Gynecology

## 2020-12-25 DIAGNOSIS — N971 Female infertility of tubal origin: Secondary | ICD-10-CM

## 2020-12-28 ENCOUNTER — Other Ambulatory Visit (HOSPITAL_COMMUNITY): Payer: Self-pay

## 2021-01-02 ENCOUNTER — Other Ambulatory Visit (HOSPITAL_COMMUNITY): Payer: Self-pay

## 2021-01-14 ENCOUNTER — Encounter: Payer: Self-pay | Admitting: Family Medicine

## 2021-01-16 ENCOUNTER — Encounter: Payer: Self-pay | Admitting: Family Medicine

## 2021-01-16 ENCOUNTER — Telehealth (INDEPENDENT_AMBULATORY_CARE_PROVIDER_SITE_OTHER): Payer: BC Managed Care – PPO | Admitting: Family Medicine

## 2021-01-16 ENCOUNTER — Other Ambulatory Visit (HOSPITAL_COMMUNITY): Payer: Self-pay

## 2021-01-16 VITALS — Ht 64.2 in | Wt 270.0 lb

## 2021-01-16 DIAGNOSIS — R21 Rash and other nonspecific skin eruption: Secondary | ICD-10-CM | POA: Diagnosis not present

## 2021-01-16 DIAGNOSIS — F419 Anxiety disorder, unspecified: Secondary | ICD-10-CM | POA: Diagnosis not present

## 2021-01-16 DIAGNOSIS — F32A Depression, unspecified: Secondary | ICD-10-CM | POA: Insufficient documentation

## 2021-01-16 DIAGNOSIS — E282 Polycystic ovarian syndrome: Secondary | ICD-10-CM | POA: Diagnosis not present

## 2021-01-16 MED ORDER — FLUOXETINE HCL 40 MG PO CAPS
ORAL_CAPSULE | Freq: Every day | ORAL | 1 refills | Status: DC
  Filled 2021-01-16: qty 30, 30d supply, fill #0
  Filled 2021-02-12: qty 30, 30d supply, fill #1

## 2021-01-16 MED ORDER — METFORMIN HCL 500 MG PO TABS
500.0000 mg | ORAL_TABLET | Freq: Two times a day (BID) | ORAL | 1 refills | Status: DC
  Filled 2021-01-16: qty 60, 30d supply, fill #0
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
## 2021-01-17 ENCOUNTER — Other Ambulatory Visit (HOSPITAL_COMMUNITY): Payer: Self-pay

## 2021-01-17 MED FILL — Ergocalciferol Cap 1.25 MG (50000 Unit): ORAL | 28 days supply | Qty: 4 | Fill #0 | Status: AC

## 2021-01-18 ENCOUNTER — Other Ambulatory Visit: Payer: Self-pay

## 2021-01-18 ENCOUNTER — Other Ambulatory Visit (INDEPENDENT_AMBULATORY_CARE_PROVIDER_SITE_OTHER): Payer: BC Managed Care – PPO

## 2021-01-18 ENCOUNTER — Other Ambulatory Visit: Payer: BC Managed Care – PPO

## 2021-01-18 DIAGNOSIS — E282 Polycystic ovarian syndrome: Secondary | ICD-10-CM

## 2021-01-18 DIAGNOSIS — R21 Rash and other nonspecific skin eruption: Secondary | ICD-10-CM | POA: Diagnosis not present

## 2021-01-18 LAB — CBC WITH DIFFERENTIAL/PLATELET
Basophils Absolute: 0.1 10*3/uL (ref 0.0–0.1)
Basophils Relative: 1 % (ref 0.0–3.0)
Eosinophils Absolute: 0.1 10*3/uL (ref 0.0–0.7)
Eosinophils Relative: 1.2 % (ref 0.0–5.0)
HCT: 39.3 % (ref 36.0–46.0)
Hemoglobin: 13.1 g/dL (ref 12.0–15.0)
Lymphocytes Relative: 31.1 % (ref 12.0–46.0)
Lymphs Abs: 1.9 10*3/uL (ref 0.7–4.0)
MCHC: 33.4 g/dL (ref 30.0–36.0)
MCV: 84.1 fl (ref 78.0–100.0)
Monocytes Absolute: 0.4 10*3/uL (ref 0.1–1.0)
Monocytes Relative: 6 % (ref 3.0–12.0)
Neutro Abs: 3.6 10*3/uL (ref 1.4–7.7)
Neutrophils Relative %: 60.7 % (ref 43.0–77.0)
Platelets: 233 10*3/uL (ref 150.0–400.0)
RBC: 4.67 Mil/uL (ref 3.87–5.11)
RDW: 13.1 % (ref 11.5–15.5)
WBC: 6 10*3/uL (ref 4.0–10.5)

## 2021-01-18 LAB — LIPID PANEL
Cholesterol: 144 mg/dL (ref 0–200)
HDL: 39.3 mg/dL (ref >39.00)
LDL Cholesterol: 75 mg/dL (ref 0–99)
NonHDL: 104.24
Total CHOL/HDL Ratio: 4
Triglycerides: 146 mg/dL (ref 0.0–149.0)
VLDL: 29.2 mg/dL (ref 0.0–40.0)

## 2021-01-18 LAB — HEPATIC FUNCTION PANEL
ALT: 18 U/L (ref 0–35)
AST: 15 U/L (ref 0–37)
Albumin: 4 g/dL (ref 3.5–5.2)
Alkaline Phosphatase: 50 U/L (ref 39–117)
Bilirubin, Direct: 0.1 mg/dL (ref 0.0–0.3)
Total Bilirubin: 0.9 mg/dL (ref 0.2–1.2)
Total Protein: 7.2 g/dL (ref 6.0–8.3)

## 2021-01-18 LAB — BASIC METABOLIC PANEL
BUN: 15 mg/dL (ref 6–23)
CO2: 26 mEq/L (ref 19–32)
Calcium: 8.7 mg/dL (ref 8.4–10.5)
Chloride: 106 mEq/L (ref 96–112)
Creatinine, Ser: 0.79 mg/dL (ref 0.40–1.20)
GFR: 99.32 mL/min (ref >60.00)
Glucose, Bld: 106 mg/dL — ABNORMAL HIGH (ref 70–99)
Potassium: 3.7 mEq/L (ref 3.5–5.1)
Sodium: 139 mEq/L (ref 135–145)

## 2021-01-18 LAB — TSH: TSH: 1.85 u[IU]/mL (ref 0.35–4.50)

## 2021-01-18 LAB — HEMOGLOBIN A1C: Hgb A1c MFr Bld: 5.5 % (ref 4.6–6.5)

## 2021-01-21 LAB — ANTI-NUCLEAR AB-TITER (ANA TITER): ANA Titer 1: 1:40 {titer} — ABNORMAL HIGH

## 2021-01-21 LAB — ANA: Anti Nuclear Antibody (ANA): POSITIVE — AB

## 2021-01-22 ENCOUNTER — Other Ambulatory Visit: Payer: Self-pay

## 2021-01-22 DIAGNOSIS — R21 Rash and other nonspecific skin eruption: Secondary | ICD-10-CM

## 2021-01-22 DIAGNOSIS — R768 Other specified abnormal immunological findings in serum: Secondary | ICD-10-CM

## 2021-01-25 ENCOUNTER — Encounter: Payer: Self-pay | Admitting: Family Medicine

## 2021-01-28 ENCOUNTER — Ambulatory Visit (HOSPITAL_BASED_OUTPATIENT_CLINIC_OR_DEPARTMENT_OTHER): Payer: BLUE CROSS/BLUE SHIELD | Admitting: Obstetrics & Gynecology

## 2021-02-12 MED FILL — Ergocalciferol Cap 1.25 MG (50000 Unit): ORAL | 28 days supply | Qty: 4 | Fill #1 | Status: AC

## 2021-02-25 ENCOUNTER — Other Ambulatory Visit (HOSPITAL_COMMUNITY): Payer: Self-pay

## 2021-02-26 ENCOUNTER — Other Ambulatory Visit (HOSPITAL_COMMUNITY): Payer: Self-pay

## 2021-03-01 ENCOUNTER — Other Ambulatory Visit: Payer: Self-pay

## 2021-03-01 ENCOUNTER — Encounter: Payer: Self-pay | Admitting: Internal Medicine

## 2021-03-01 ENCOUNTER — Ambulatory Visit: Payer: BC Managed Care – PPO | Admitting: Internal Medicine

## 2021-03-01 VITALS — BP 121/80 | HR 71 | Ht 64.0 in | Wt 261.6 lb

## 2021-03-01 DIAGNOSIS — R21 Rash and other nonspecific skin eruption: Secondary | ICD-10-CM | POA: Insufficient documentation

## 2021-03-01 DIAGNOSIS — R768 Other specified abnormal immunological findings in serum: Secondary | ICD-10-CM | POA: Diagnosis not present
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
## 2021-03-01 NOTE — Patient Instructions (Signed)
Antinuclear Antibody Test Why am I having this test? This is a test that is used to help diagnose systemic lupus erythematosus (SLE) and other autoimmune diseases. An autoimmune disease is a disease in which the body's own defense (immune)system attacks its organs. What is being tested? This test checks for antinuclear antibodies (ANA) in the blood. The presence of ANA is associated with several autoimmune diseases. It is seen in almost allpatients with lupus. What kind of sample is taken?  A blood sample is required for this test. It is usually collected by insertinga needle into a blood vessel. How are the results reported? Your test results will be reported as either positive or negative. A false-positive result can occur. A false positive is incorrect because itmeans that a condition is present when it is not. What do the results mean? A positive test result may mean that you have: Lupus. Other autoimmune diseases, such as rheumatoid arthritis, scleroderma, or Sjgren syndrome. Conditions that may cause a false-positive result include: Liver dysfunction. Myasthenia gravis. Infectious mononucleosis. Talk with your health care provider about what your results mean. Questions to ask your health care provider Ask your health care provider, or the department that is doing the test: When will my results be ready? How will I get my results? What are my treatment options? What other tests do I need? What are my next steps? Summary This is a test that is used to help diagnose systemic lupus erythematosus (SLE) and other autoimmune diseases. An autoimmune disease is a disease in which the body's own defense (immune)system attacks the body. This test checks for antinuclear antibodies (ANA) in the blood. The presence of ANA is associated with several autoimmune diseases. It is seen in almost all patients with lupus. Your test results will be reported as either positive or negative. Talk with  your health care provider about what your results mean.   Anti-DNA Antibody Test Why am I having this test? The anti-DNA antibody test helps with the diagnosis and follow-up of systemic lupus erythematosus (SLE). It is also used to monitor treatment of thiscondition as the antibody decreases with successful therapy. What is being tested? This test measures the amount of anti-DNA antibody in the blood. This antibody is found in 65-80% of patients with active SLE. This antibody is not as commonin patients who have other diseases. What kind of sample is taken?  A blood sample is required for this test. It is usually collected by insertinga needle into a blood vessel. How are the results reported? Your test results will be reported as a value. Your test results may also be reported as positive, intermediate, or negative. Your health care provider will compare your results to normal ranges that were established after testing a large group of people (reference values). Reference values may vary among labs and hospitals. For this test, common reference values are: Positive: 10 or more international units/mL. Intermediate: 5-9 international units/mL. Negative: Less than 5 international units/mL. What do the results mean? Positive results, which are associated with results that are higher than the reference values, may indicate: Autoimmune disorders such as SLE. Infectious mononucleosis. Chronic liver conditions. Intermediate results mean that the anti-DNA antibody levels are higher thannormal, but not high enough to be considered positive. Negative results mean that you do not have the anti-DNA antibody that isassociated with these conditions. Talk with your health care provider about what your results mean. Questions to ask your health care provider Ask your health care provider, or the  department that is doing the test: When will my results be ready? How will I get my results? What are my  treatment options? What other tests do I need? What are my next steps? Summary The anti-DNA antibody test helps with the diagnosis and follow-up of systemic lupus erythematosus (SLE). It is also used to monitor treatment of this condition as the antibody decreases with successful therapy. This test measures the amount of anti-DNA antibody in the blood. Elevated levels of anti-DNA antibody can be seen in patients with SLE and certain other conditions. This information is not intended to replace advice given to you by your health care provider. Make sure you discuss any questions you have with your healthcare provider. Document Revised: 04/27/2020 Document Reviewed: 04/27/2020 Elsevier Patient Education  2022 [employer].

## 2021-03-04 ENCOUNTER — Encounter: Payer: Self-pay | Admitting: Internal Medicine

## 2021-03-04 LAB — SJOGRENS SYNDROME-A EXTRACTABLE NUCLEAR ANTIBODY: SSA (Ro) (ENA) Antibody, IgG: <1 AI

## 2021-03-04 LAB — RNP ANTIBODY: Ribonucleic Protein(ENA) Antibody, IgG: <1 AI

## 2021-03-04 LAB — ANTI-SMITH ANTIBODY: ENA SM Ab Ser-aCnc: <1 AI

## 2021-03-04 LAB — SJOGRENS SYNDROME-B EXTRACTABLE NUCLEAR ANTIBODY: SSB (La) (ENA) Antibody, IgG: <1 AI

## 2021-03-04 LAB — ANTI-DNA ANTIBODY, DOUBLE-STRANDED: ds DNA Ab: 1 IU/mL

## 2021-03-06 ENCOUNTER — Encounter: Payer: Self-pay | Admitting: Family Medicine

## 2021-03-06 ENCOUNTER — Encounter: Payer: Self-pay | Admitting: *Deleted

## 2021-03-07 ENCOUNTER — Other Ambulatory Visit: Payer: Self-pay

## 2021-03-07 ENCOUNTER — Other Ambulatory Visit (HOSPITAL_COMMUNITY): Payer: Self-pay

## 2021-03-07 MED ORDER — FLUOXETINE HCL 40 MG PO CAPS
ORAL_CAPSULE | Freq: Every day | ORAL | 1 refills | Status: DC
  Filled 2021-03-07: qty 90, fill #0
  Filled 2021-04-02: qty 90, 90d supply, fill #0
  Filled 2021-06-26: qty 90, 90d supply, fill #1

## 2021-03-08 ENCOUNTER — Other Ambulatory Visit (HOSPITAL_COMMUNITY): Payer: Self-pay

## 2021-03-08 NOTE — Telephone Encounter (Signed)
FYI-I spoke with Brittany Lindsey her lab results are all negative from clinic visit discussed I do not think her skin rashes and low positive ANA represents a systemic autoimmune disorder.  I recommended if this is related to her previous COVID infection may improve further on its own and if it does not she can consider seeing dermatology for evaluation.

## 2021-04-02 ENCOUNTER — Other Ambulatory Visit (HOSPITAL_COMMUNITY): Payer: Self-pay

## 2021-04-04 ENCOUNTER — Encounter: Payer: Self-pay | Admitting: Family Medicine

## 2021-04-05 ENCOUNTER — Ambulatory Visit: Payer: 59

## 2021-04-05 ENCOUNTER — Other Ambulatory Visit: Payer: Self-pay

## 2021-04-05 DIAGNOSIS — Z1159 Encounter for screening for other viral diseases: Secondary | ICD-10-CM

## 2021-04-05 DIAGNOSIS — Z23 Encounter for immunization: Secondary | ICD-10-CM

## 2021-04-05 NOTE — Addendum Note (Signed)
Addended by: Joylene Igo L on: 04/05/2021 10:01 AM   Modules accepted: Orders

## 2021-04-05 NOTE — Addendum Note (Signed)
Addended by: Ilda Foil on: 04/05/2021 09:59 AM   Modules accepted: Orders

## 2021-04-05 NOTE — Addendum Note (Signed)
Addended by: Joylene Igo L on: 04/05/2021 09:55 AM   Modules accepted: Orders

## 2021-04-05 NOTE — Addendum Note (Signed)
Addended by: Joylene Igo L on: 04/05/2021 09:58 AM   Modules accepted: Orders

## 2021-04-11 ENCOUNTER — Encounter: Payer: Self-pay | Admitting: Family Medicine

## 2021-04-16 ENCOUNTER — Other Ambulatory Visit: Payer: Self-pay

## 2021-04-16 ENCOUNTER — Other Ambulatory Visit (HOSPITAL_COMMUNITY): Payer: Self-pay

## 2021-04-16 ENCOUNTER — Other Ambulatory Visit (HOSPITAL_COMMUNITY)
Admission: RE | Admit: 2021-04-16 | Discharge: 2021-04-16 | Disposition: A | Discharge status: Home / Self Care | Payer: 59 | Source: Ambulatory Visit | Attending: Registered Nurse | Admitting: Registered Nurse

## 2021-04-16 ENCOUNTER — Encounter: Payer: Self-pay | Admitting: Registered Nurse

## 2021-04-16 ENCOUNTER — Ambulatory Visit (INDEPENDENT_AMBULATORY_CARE_PROVIDER_SITE_OTHER): Payer: 59 | Admitting: Registered Nurse

## 2021-04-16 VITALS — BP 113/78 | HR 73 | Temp 98.3°F | Resp 18 | Ht 64.0 in | Wt 267.6 lb

## 2021-04-16 DIAGNOSIS — F418 Other specified anxiety disorders: Secondary | ICD-10-CM | POA: Diagnosis not present

## 2021-04-16 DIAGNOSIS — Z113 Encounter for screening for infections with a predominantly sexual mode of transmission: Secondary | ICD-10-CM | POA: Diagnosis present

## 2021-04-16 DIAGNOSIS — R41840 Attention and concentration deficit: Secondary | ICD-10-CM | POA: Diagnosis not present

## 2021-04-16 MED ORDER — HYDROXYZINE HCL 10 MG PO TABS
5.0000 mg | ORAL_TABLET | Freq: Two times a day (BID) | ORAL | 1 refills | Status: DC | PRN
  Filled 2021-04-16: qty 30, 8d supply, fill #0
  Filled 2021-05-18: qty 30, 8d supply, fill #1

## 2021-04-16 MED ORDER — CLONAZEPAM 0.5 MG PO TABS
0.5000 mg | ORAL_TABLET | Freq: Every day | ORAL | 1 refills | Status: DC | PRN
  Filled 2021-04-16: qty 20, 20d supply, fill #0
  Filled 2021-05-18: qty 20, 20d supply, fill #1

## 2021-04-16 NOTE — Progress Notes (Signed)
Established Patient Office Visit  Subjective:  Patient ID: Brittany Lindsey, female    DOB: Dec 30, 1990  Age: 30 y.o. MRN: [id number]  CC:  Chief Complaint  Patient presents with   Depression    Patient would like to discuss some depression PHQ9=18 . Patient states she would like to discuss getting a STD screening.    HPI Brittany Lindsey presents for STI screen and depression  Depression Ongoing, anxiety worsening Has been better on prozac compliance but admits she is skipping some doses. Feels situational anxiety - when going shopping, out and public. Worse since covid Also having trouble focusing, finishing tasks Interested in work up for ADD/ADHD  STI screening Tested positive for chlamydia in March  Has not been retested No ongoing symptoms or new concerns Aware of incubation periods  Past Medical History:  Diagnosis Date   PCOS (polycystic ovarian syndrome)    Scoliosis    Tubo-ovarian abscess Feb 2016    Past Surgical History:  Procedure Laterality Date   CHROMOPERTUBATION N/A 11/02/2015   Procedure: CHROMOPERTUBATION;  Surgeon: Silverio Lay, MD;  Location: [facility];  Service: Gynecology;  Laterality: N/A;   LAPAROSCOPY Right 11/02/2015   Procedure: LAPAROSCOPY OPERATIVE with Cure of Hydrosalpynx  with Right Salpingectomy;  Surgeon: Silverio Lay, MD;  Location: [facility];  Service: Gynecology;  Laterality: Right;   LYSIS OF ADHESION N/A 11/02/2015   Procedure: EXTENSIVE LYSIS OF ADHESION;  Surgeon: Silverio Lay, MD;  Location: [facility];  Service: Gynecology;  Laterality: N/A;    Family History  Problem Relation Age of Onset   Diabetes Mother    Seizures Mother    Hyperlipidemia Mother    Diabetes Father    Hypertension Father    Thyroid disease Maternal Grandmother    Lupus Other     Social History   Socioeconomic History   Marital status: Single    Spouse name: Not on file   Number of children: Not on file   Years of education: Not on file   Highest  education level: Not on file  Occupational History   Not on file  Tobacco Use   Smoking status: Never   Smokeless tobacco: Never  Vaping Use   Vaping Use: Never used  Substance and Sexual Activity   Alcohol use: No   Drug use: No   Sexual activity: Yes    Birth control/protection: None  Other Topics Concern   Not on file  Social History Narrative   Not on file   Social Determinants of Health   Financial Resource Strain: Not on file  Food Insecurity: Not on file  Transportation Needs: Not on file  Physical Activity: Not on file  Stress: Not on file  Social Connections: Not on file  Intimate Partner Violence: Not on file    Outpatient Medications Prior to Visit  Medication Sig Dispense Refill   albuterol (VENTOLIN HFA) 108 (90 Base) MCG/ACT inhaler INHALE 2 PUFFS BY MOUTH INTO THE LUNGS EVERY 6 HOURS AS NEEDED FOR WHEEZING OR SHORTNESS OF BREATH 18 g 0   FLUoxetine (PROZAC) 40 MG capsule TAKE 1 CAPSULE BY MOUTH ONCE A DAY 90 capsule 1   metFORMIN (GLUCOPHAGE) 500 MG tablet Take 1 tablet (500 mg total) by mouth 2 (two) times daily with a meal. 180 tablet 1   Vitamin D, Ergocalciferol, (DRISDOL) 1.25 MG (50000 UNIT) CAPS capsule TAKE 1 CAPSULE BY MOUTH EVERY 7 DAYS. 12 capsule 0   No facility-administered medications prior to visit.    Allergies  Allergen Reactions   Mushroom Extract Complex Hives   Penicillins Hives    Has patient had a PCN reaction causing immediate rash, facial/tongue/throat swelling, SOB or lightheadedness with hypotension: No Has patient had a PCN reaction causing severe rash involving mucus membranes or skin necrosis: No-- pt had hives Has patient had a PCN reaction that required hospitalization: No Has patient had a PCN reaction occurring within the last 10 years: No If all of the above answers are "NO", then may proceed with Cephalosporin use.     ROS Review of Systems  Constitutional: Negative.   HENT: Negative.    Eyes: Negative.    Respiratory: Negative.    Cardiovascular: Negative.   Gastrointestinal: Negative.   Genitourinary: Negative.   Musculoskeletal: Negative.   Skin: Negative.   Neurological: Negative.   Psychiatric/Behavioral: Negative.    All other systems reviewed and are negative.    Objective:    Physical Exam Vitals and nursing note reviewed.  Constitutional:      General: She is not in acute distress.    Appearance: Normal appearance. She is normal weight. She is not ill-appearing, toxic-appearing or diaphoretic.  Cardiovascular:     Rate and Rhythm: Normal rate and regular rhythm.     Heart sounds: Normal heart sounds. No murmur heard.   No friction rub. No gallop.  Pulmonary:     Effort: Pulmonary effort is normal. No respiratory distress.     Breath sounds: Normal breath sounds. No stridor. No wheezing, rhonchi or rales.  Chest:     Chest wall: No tenderness.  Skin:    General: Skin is warm and dry.  Neurological:     General: No focal deficit present.     Mental Status: She is alert and oriented to person, place, and time. Mental status is at baseline.  Psychiatric:        Mood and Affect: Mood normal.        Behavior: Behavior normal.        Thought Content: Thought content normal.        Judgment: Judgment normal.    BP 113/78   Pulse 73   Temp 98.3 F (36.8 C) (Temporal)   Resp 18   Ht 5\' 4"  (1.626 m)   Wt 267 lb 9.6 oz (121.4 kg)   SpO2 99%   BMI 45.93 kg/m  Wt Readings from Last 3 Encounters:  04/16/21 267 lb 9.6 oz (121.4 kg)  03/01/21 261 lb 9.6 oz (118.7 kg)  01/16/21 270 lb (122.5 kg)     Health Maintenance Due  Topic Date Due   INFLUENZA VACCINE  04/08/2021    There are no preventive care reminders to display for this patient.  Lab Results  Component Value Date   TSH 1.85 01/18/2021   Lab Results  Component Value Date   WBC 6.0 01/18/2021   HGB 13.1 01/18/2021   HCT 39.3 01/18/2021   MCV 84.1 01/18/2021   PLT 233.0 01/18/2021   Lab Results   Component Value Date   NA 139 01/18/2021   K 3.7 01/18/2021   CO2 26 01/18/2021   GLUCOSE 106 (H) 01/18/2021   BUN 15 01/18/2021   CREATININE 0.79 01/18/2021   BILITOT 0.9 01/18/2021   ALKPHOS 50 01/18/2021   AST 15 01/18/2021   ALT 18 01/18/2021   PROT 7.2 01/18/2021   ALBUMIN 4.0 01/18/2021   CALCIUM 8.7 01/18/2021   ANIONGAP 7 11/13/2017   GFR 99.32 01/18/2021   Lab Results  Component Value Date   CHOL 144 01/18/2021   Lab Results  Component Value Date   HDL 39.30 01/18/2021   Lab Results  Component Value Date   LDLCALC 75 01/18/2021   Lab Results  Component Value Date   TRIG 146.0 01/18/2021   Lab Results  Component Value Date   CHOLHDL 4 01/18/2021   Lab Results  Component Value Date   HGBA1C 5.5 01/18/2021      Assessment & Plan:   Problem List Items Addressed This Visit   None Visit Diagnoses     Screen for STD (sexually transmitted disease)    -  Primary   Relevant Orders   Urine cytology ancillary only([place])   HIV antibody (with reflex)   RPR   Situational anxiety       Relevant Medications   clonazePAM (KLONOPIN) 0.5 MG tablet   hydrOXYzine (ATARAX/VISTARIL) 10 MG tablet   Concentration deficit           Meds ordered this encounter  Medications   clonazePAM (KLONOPIN) 0.5 MG tablet    Sig: Take 1 tablet (0.5 mg total) by mouth daily as needed for anxiety.    Dispense:  20 tablet    Refill:  1    Order Specific Question:   Supervising Provider    Answer:   Neva Seat, JEFFREY R [[id number]]   hydrOXYzine (ATARAX/VISTARIL) 10 MG tablet    Sig: Take 0.5-2 tablets (5-20 mg total) by mouth 2 (two) times daily as needed for anxiety (or for help falling asleep).    Dispense:  30 tablet    Refill:  1    Order Specific Question:   Supervising Provider    Answer:   Neva Seat, JEFFREY R [[id number]]    Follow-up: Return if symptoms worsen or fail to improve, for as scheduled with PCP.   PLAN STD screening collected, follow up as  warranted Encouraged compliance with daily prozac for better effect. She has visit with Dr. Beverely Low in 6 weeks Can add prn clonazepam and/or hydroxyzine for situational relief. Discussed risks, benefits, and alternatives with patient who voiced understanding Patient encouraged to call clinic with any questions, comments, or concerns.  Janeece Agee, NP

## 2021-04-16 NOTE — Patient Instructions (Addendum)
Ms. Halston Fairclough to meet you  Let me know if the clonazepam works. Can try this and hydroxyzine intermittently for situational anxiety. They each may be sedating. Do not take clonazepam with alcohol.  Check in with Dr. Beverely Low as scheduled.  Thank you  Rich     If you have lab work done today you will be contacted with your lab results within the next 2 weeks.  If you have not heard from [place] then please contact us. The fastest way to get your results is to register for My Chart.   IF you received an x-ray today, you will receive an invoice from [facility]. Please contact [facility] at [phone] with questions or concerns regarding your invoice.   IF you received labwork today, you will receive an invoice from [facility]. Please contact [facility] at [phone] with questions or concerns regarding your invoice.   Our billing staff will not be able to assist you with questions regarding bills from these companies.  You will be contacted with the lab results as soon as they are available. The fastest way to get your results is to activate your My Chart account. Instructions are located on the last page of this paperwork. If you have not heard from [place] regarding the results in 2 weeks, please contact this office.

## 2021-04-17 LAB — MEASLES/MUMPS/RUBELLA IMMUNITY
Mumps IgG: 69.5 AU/mL
Rubella: 7.69 Index
Rubeola IgG: >300 AU/mL

## 2021-04-17 LAB — URINE CYTOLOGY ANCILLARY ONLY
Chlamydia: NEGATIVE
Comment: NEGATIVE
Comment: NEGATIVE
Comment: NORMAL
Neisseria Gonorrhea: NEGATIVE
Trichomonas: NEGATIVE

## 2021-04-17 LAB — VARICELLA-ZOSTER VIRUS AB(IMMUNITY SCREEN),ACIF,SERUM: VARICELLA ZOSTER VIRUS AB (IMMUNITY SCR),ACIF SERUM: >=1:4 {titer}

## 2021-04-17 LAB — HIV ANTIBODY (ROUTINE TESTING W REFLEX): HIV 1&2 Ab, 4th Generation: NONREACTIVE

## 2021-04-17 LAB — RPR: RPR Ser Ql: NONREACTIVE

## 2021-04-17 LAB — HEPATITIS B CORE ANTIBODY, IGM: Hep B C IgM: NONREACTIVE

## 2021-05-18 ENCOUNTER — Other Ambulatory Visit: Payer: Self-pay | Admitting: Family Medicine

## 2021-05-18 ENCOUNTER — Other Ambulatory Visit (HOSPITAL_COMMUNITY): Payer: Self-pay

## 2021-05-18 DIAGNOSIS — E559 Vitamin D deficiency, unspecified: Secondary | ICD-10-CM

## 2021-05-20 ENCOUNTER — Other Ambulatory Visit (HOSPITAL_COMMUNITY): Payer: Self-pay

## 2021-05-20 NOTE — Telephone Encounter (Signed)
Patient is requesting a refill of the following medications: Requested Prescriptions   Pending Prescriptions Disp Refills   Vitamin D, Ergocalciferol, (DRISDOL) 1.25 MG (50000 UNIT) CAPS capsule 12 capsule 0    Sig: TAKE 1 CAPSULE BY MOUTH EVERY 7 DAYS.    Date of patient request: 05/18/2021 Last office visit: 04/16/2021 Date of last refill: 11/27/20 Last refill amount: 12 capsules Follow up time period per chart: 05/29/2021

## 2021-05-24 ENCOUNTER — Encounter: Payer: Self-pay | Admitting: Registered Nurse

## 2021-05-29 ENCOUNTER — Encounter: Payer: BLUE CROSS/BLUE SHIELD | Admitting: Family Medicine

## 2021-06-02 ENCOUNTER — Encounter: Payer: Self-pay | Admitting: Family Medicine

## 2021-06-02 DIAGNOSIS — Z202 Contact with and (suspected) exposure to infections with a predominantly sexual mode of transmission: Secondary | ICD-10-CM

## 2021-06-03 ENCOUNTER — Other Ambulatory Visit: Payer: 59

## 2021-06-03 ENCOUNTER — Other Ambulatory Visit (HOSPITAL_COMMUNITY)
Admission: RE | Admit: 2021-06-03 | Discharge: 2021-06-03 | Disposition: A | Discharge status: Home / Self Care | Payer: 59 | Source: Ambulatory Visit | Attending: Family Medicine | Admitting: Family Medicine

## 2021-06-03 ENCOUNTER — Other Ambulatory Visit: Payer: Self-pay

## 2021-06-03 DIAGNOSIS — Z202 Contact with and (suspected) exposure to infections with a predominantly sexual mode of transmission: Secondary | ICD-10-CM

## 2021-06-05 LAB — URINE CYTOLOGY ANCILLARY ONLY
Bacterial Vaginitis-Urine: NEGATIVE
Candida Urine: NEGATIVE
Chlamydia: NEGATIVE
Comment: NEGATIVE
Comment: NEGATIVE
Comment: NORMAL
Neisseria Gonorrhea: NEGATIVE
Trichomonas: NEGATIVE

## 2021-06-05 LAB — HIV ANTIBODY (ROUTINE TESTING W REFLEX): HIV 1&2 Ab, 4th Generation: NONREACTIVE

## 2021-06-05 LAB — RPR: RPR Ser Ql: NONREACTIVE

## 2021-06-24 ENCOUNTER — Ambulatory Visit (INDEPENDENT_AMBULATORY_CARE_PROVIDER_SITE_OTHER): Payer: 59 | Admitting: Family Medicine

## 2021-06-24 ENCOUNTER — Other Ambulatory Visit: Payer: Self-pay

## 2021-06-24 ENCOUNTER — Encounter: Payer: Self-pay | Admitting: Family Medicine

## 2021-06-24 VITALS — BP 122/82 | HR 75 | Temp 98.2°F | Resp 16 | Ht 64.0 in | Wt 273.2 lb

## 2021-06-24 DIAGNOSIS — Z0000 Encounter for general adult medical examination without abnormal findings: Secondary | ICD-10-CM

## 2021-06-24 DIAGNOSIS — Z23 Encounter for immunization: Secondary | ICD-10-CM | POA: Diagnosis not present

## 2021-06-24 DIAGNOSIS — Z124 Encounter for screening for malignant neoplasm of cervix: Secondary | ICD-10-CM

## 2021-06-24 DIAGNOSIS — R0683 Snoring: Secondary | ICD-10-CM

## 2021-06-24 DIAGNOSIS — K922 Gastrointestinal hemorrhage, unspecified: Secondary | ICD-10-CM

## 2021-06-24 DIAGNOSIS — E282 Polycystic ovarian syndrome: Secondary | ICD-10-CM

## 2021-06-24 LAB — CBC WITH DIFFERENTIAL/PLATELET
Basophils Absolute: 0 10*3/uL (ref 0.0–0.1)
Basophils Relative: 0.7 % (ref 0.0–3.0)
Eosinophils Absolute: 0.1 10*3/uL (ref 0.0–0.7)
Eosinophils Relative: 1.7 % (ref 0.0–5.0)
HCT: 41.8 % (ref 36.0–46.0)
Hemoglobin: 14.1 g/dL (ref 12.0–15.0)
Lymphocytes Relative: 35 % (ref 12.0–46.0)
Lymphs Abs: 2.3 10*3/uL (ref 0.7–4.0)
MCHC: 33.7 g/dL (ref 30.0–36.0)
MCV: 83.7 fl (ref 78.0–100.0)
Monocytes Absolute: 0.5 10*3/uL (ref 0.1–1.0)
Monocytes Relative: 8.2 % (ref 3.0–12.0)
Neutro Abs: 3.5 10*3/uL (ref 1.4–7.7)
Neutrophils Relative %: 54.4 % (ref 43.0–77.0)
Platelets: 277 10*3/uL (ref 150.0–400.0)
RBC: 5 Mil/uL (ref 3.87–5.11)
RDW: 12.4 % (ref 11.5–15.5)
WBC: 6.5 10*3/uL (ref 4.0–10.5)

## 2021-06-24 LAB — LDL CHOLESTEROL, DIRECT: Direct LDL: 91 mg/dL

## 2021-06-24 LAB — BASIC METABOLIC PANEL
BUN: 11 mg/dL (ref 6–23)
CO2: 27 mEq/L (ref 19–32)
Calcium: 8.9 mg/dL (ref 8.4–10.5)
Chloride: 103 mEq/L (ref 96–112)
Creatinine, Ser: 0.77 mg/dL (ref 0.40–1.20)
GFR: 102.12 mL/min (ref >60.00)
Glucose, Bld: 79 mg/dL (ref 70–99)
Potassium: 4.1 mEq/L (ref 3.5–5.1)
Sodium: 136 mEq/L (ref 135–145)

## 2021-06-24 LAB — LIPID PANEL
Cholesterol: 148 mg/dL (ref 0–200)
HDL: 38.4 mg/dL — ABNORMAL LOW (ref >39.00)
NonHDL: 109.27
Total CHOL/HDL Ratio: 4
Triglycerides: 210 mg/dL — ABNORMAL HIGH (ref 0.0–149.0)
VLDL: 42 mg/dL — ABNORMAL HIGH (ref 0.0–40.0)

## 2021-06-24 LAB — HEPATIC FUNCTION PANEL
ALT: 20 U/L (ref 0–35)
AST: 18 U/L (ref 0–37)
Albumin: 4.2 g/dL (ref 3.5–5.2)
Alkaline Phosphatase: 58 U/L (ref 39–117)
Bilirubin, Direct: 0.1 mg/dL (ref 0.0–0.3)
Total Bilirubin: 0.7 mg/dL (ref 0.2–1.2)
Total Protein: 7.1 g/dL (ref 6.0–8.3)

## 2021-06-24 LAB — TSH: TSH: 1.97 u[IU]/mL (ref 0.35–5.50)

## 2021-06-24 LAB — VITAMIN D 25 HYDROXY (VIT D DEFICIENCY, FRACTURES): VITD: 15.02 ng/mL — ABNORMAL LOW (ref 30.00–100.00)

## 2021-06-24 NOTE — Patient Instructions (Addendum)
Follow up in 6 months to see where we are w/ the PCOS stuff We'll notify you of your lab results and make any changes if needed Continue to work on healthy diet and regular exercise- you can do it! We'll call you with your GI and GYN appts We'll call you with your pulmonary appts Call with any questions or concerns Stay Safe!  Stay Healthy!!

## 2021-06-24 NOTE — Progress Notes (Signed)
   Subjective:    Patient ID: Brittany Lindsey, female    DOB: 1991/07/18, 30 y.o.   MRN: [id number]  HPI CPE- UTD on Tdap.  Flu today.  Due for pap  Patient Care Team    Relationship Specialty Notifications Start End  Sheliah Hatch, MD PCP - General Family Medicine  02/10/14   Silverio Lay, MD Consulting Physician Obstetrics and Gynecology  10/02/15     Health Maintenance  Topic Date Due   PAP SMEAR-Modifier  09/10/2018   INFLUENZA VACCINE  04/08/2021   COVID-19 Vaccine (3 - Booster for Pfizer series) 07/10/2021 (Originally 05/31/2021)   Hepatitis C Screening  11/26/2021 (Originally 02/17/2007)   URINE MICROALBUMIN  06/24/2024 (Originally 02/17/1999)   TETANUS/TDAP  03/29/2025   HIV Screening  Completed   HPV VACCINES  Aged Out     Review of Systems Patient reports no vision/hearing changes, adenopathy,fever, weight change, persistant/recurrent hoarseness , swallowing issues, chest pain, palpitations, edema, persistant/recurrent cough, hemoptysis, dyspnea (rest/exertional/paroxysmal nocturnal), abdominal pain, significant heartburn, bowel changes, GU symptoms (dysuria, hematuria, incontinence), Gyn symptoms (abnormal  bleeding, pain),  syncope, focal weakness, memory loss, skin/hair/nail changes, abnormal bruising, anxiety, or depression.   + noisy bowels + bleeding from rectum- can be red or maroon, has passed clots + carpal tunnel- has seen hand specialist, no relief w/ injections + loud snoring  This visit occurred during the SARS-CoV-2 public health emergency.  Safety protocols were in place, including screening questions prior to the visit, additional usage of staff PPE, and extensive cleaning of exam room while observing appropriate contact time as indicated for disinfecting solutions.      Objective:   Physical Exam General Appearance:    Alert, cooperative, no distress, appears stated age, obese  Head:    Normocephalic, without obvious abnormality, atraumatic  Eyes:     PERRL, conjunctiva/corneas clear, EOM's intact both eyes  Ears:    Normal TM's and external ear canals, both ears  Nose:   Nares normal, septum midline, mucosa normal, no drainage    or sinus tenderness  Throat:   Lips, mucosa, and tongue normal; teeth and gums normal  Neck:   Supple, symmetrical, trachea midline, no adenopathy;    Thyroid: no enlargement/tenderness/nodules  Back:     Symmetric, no curvature, ROM normal, no CVA tenderness  Lungs:     Clear to auscultation bilaterally, respirations unlabored  Chest Wall:    No tenderness or deformity   Heart:    Regular rate and rhythm, S1 and S2 normal, no murmur, rub   or gallop  Breast Exam:    Deferred to GYN  Abdomen:     Soft, non-tender, bowel sounds active all four quadrants,    no masses, no organomegaly  Genitalia:    Deferred to GYN  Rectal:    Extremities:   Extremities normal, atraumatic, no cyanosis or edema  Pulses:   2+ and symmetric all extremities  Skin:   Skin color, texture, turgor normal, no rashes or lesions  Lymph nodes:   Cervical, supraclavicular, and axillary nodes normal  Neurologic:   CNII-XII intact, normal strength, sensation and reflexes    throughout          Assessment & Plan:   Loud snoring- new.  Refer to pulmonary for OSA evaluation  GI bleed- refer to GI for complete evaluation and tx.

## 2021-06-26 ENCOUNTER — Other Ambulatory Visit: Payer: Self-pay | Admitting: Registered Nurse

## 2021-06-26 ENCOUNTER — Encounter: Payer: Self-pay | Admitting: Family Medicine

## 2021-06-26 ENCOUNTER — Other Ambulatory Visit (HOSPITAL_COMMUNITY): Payer: Self-pay

## 2021-06-26 ENCOUNTER — Other Ambulatory Visit: Payer: Self-pay

## 2021-06-26 DIAGNOSIS — F418 Other specified anxiety disorders: Secondary | ICD-10-CM

## 2021-06-26 DIAGNOSIS — E559 Vitamin D deficiency, unspecified: Secondary | ICD-10-CM

## 2021-06-26 MED ORDER — VITAMIN D (ERGOCALCIFEROL) 1.25 MG (50000 UNIT) PO CAPS
50000.0000 [IU] | ORAL_CAPSULE | ORAL | 0 refills | Status: DC
  Filled 2021-06-26: qty 12, 84d supply, fill #0

## 2021-06-26 NOTE — Progress Notes (Signed)
g

## 2021-06-27 ENCOUNTER — Other Ambulatory Visit (HOSPITAL_COMMUNITY): Payer: Self-pay

## 2021-06-27 MED ORDER — HYDROXYZINE HCL 10 MG PO TABS
5.0000 mg | ORAL_TABLET | Freq: Two times a day (BID) | ORAL | 1 refills | Status: DC | PRN
Start: 2021-06-27 — End: 2021-10-03
  Filled 2021-06-27: qty 30, 8d supply, fill #0
  Filled 2021-08-29: qty 30, 8d supply, fill #1

## 2021-06-27 MED ORDER — CLONAZEPAM 0.5 MG PO TABS
0.5000 mg | ORAL_TABLET | Freq: Every day | ORAL | 1 refills | Status: DC | PRN
  Filled 2021-06-27: qty 20, 20d supply, fill #0
  Filled 2021-08-29: qty 20, 20d supply, fill #1

## 2021-06-27 NOTE — Telephone Encounter (Signed)
Have addressed with patient -   For any [facility] visit, pts do not need referrals, they are able to call and schedule on their own  Thank you  Luan Pulling

## 2021-07-03 ENCOUNTER — Encounter: Payer: Self-pay | Admitting: Family Medicine

## 2021-07-09 NOTE — Telephone Encounter (Signed)
Patient called checking on her 2 previous messages - please contact patient

## 2021-07-10 ENCOUNTER — Other Ambulatory Visit (HOSPITAL_COMMUNITY): Payer: Self-pay

## 2021-07-10 MED ORDER — NORGESTIMATE-ETH ESTRADIOL 0.25-35 MG-MCG PO TABS
1.0000 | ORAL_TABLET | Freq: Every day | ORAL | 3 refills | Status: DC
  Filled 2021-07-10: qty 84, 84d supply, fill #0

## 2021-07-16 ENCOUNTER — Encounter: Payer: Self-pay | Admitting: Family Medicine

## 2021-07-16 ENCOUNTER — Other Ambulatory Visit: Payer: Self-pay | Admitting: Family Medicine

## 2021-07-16 DIAGNOSIS — R41840 Attention and concentration deficit: Secondary | ICD-10-CM

## 2021-07-16 NOTE — Progress Notes (Signed)
Pt was seen 04/16/21 and complained of concentration deficit.  At that time, an order was supposed to be placed for a referral to [facility].  Referral was not placed.  Pt reached out today asking about it so I will place as stat to try and push through as quickly as possible.

## 2021-07-16 NOTE — Progress Notes (Signed)
[facility]

## 2021-08-10 ENCOUNTER — Encounter: Payer: Self-pay | Admitting: Family Medicine

## 2021-08-10 DIAGNOSIS — G4733 Obstructive sleep apnea (adult) (pediatric): Secondary | ICD-10-CM

## 2021-08-12 ENCOUNTER — Other Ambulatory Visit: Payer: Self-pay

## 2021-08-12 DIAGNOSIS — G4733 Obstructive sleep apnea (adult) (pediatric): Secondary | ICD-10-CM

## 2021-08-12 NOTE — Telephone Encounter (Signed)
Pt requesting sleep study due to several sxs that she has noticed.   Would you prefer office evaluation prior?

## 2021-08-27 ENCOUNTER — Encounter: Payer: Self-pay | Admitting: Family Medicine

## 2021-08-29 ENCOUNTER — Other Ambulatory Visit: Payer: Self-pay | Admitting: Family Medicine

## 2021-08-29 ENCOUNTER — Telehealth (INDEPENDENT_AMBULATORY_CARE_PROVIDER_SITE_OTHER): Payer: 59 | Admitting: Family Medicine

## 2021-08-29 ENCOUNTER — Other Ambulatory Visit (HOSPITAL_COMMUNITY): Payer: Self-pay

## 2021-08-29 ENCOUNTER — Encounter: Payer: Self-pay | Admitting: Family Medicine

## 2021-08-29 VITALS — Ht 64.0 in | Wt 270.0 lb

## 2021-08-29 DIAGNOSIS — R41840 Attention and concentration deficit: Secondary | ICD-10-CM

## 2021-08-29 DIAGNOSIS — E282 Polycystic ovarian syndrome: Secondary | ICD-10-CM | POA: Diagnosis not present

## 2021-08-29 DIAGNOSIS — E559 Vitamin D deficiency, unspecified: Secondary | ICD-10-CM

## 2021-08-29 MED ORDER — VITAMIN D (ERGOCALCIFEROL) 1.25 MG (50000 UNIT) PO CAPS
50000.0000 [IU] | ORAL_CAPSULE | ORAL | 0 refills | Status: DC
  Filled 2021-08-29: qty 12, 84d supply, fill #0

## 2021-08-29 MED ORDER — METHYLPHENIDATE HCL ER (OSM) 18 MG PO TBCR
18.0000 mg | EXTENDED_RELEASE_TABLET | Freq: Every day | ORAL | 0 refills | Status: DC
  Filled 2021-08-29: qty 30, 30d supply, fill #0

## 2021-08-29 NOTE — Progress Notes (Signed)
Virtual Visit via Video   I connected with patient on 08/29/21 at 11:30 AM EST by a video enabled telemedicine application and verified that I am speaking with the correct person using two identifiers.  Location patient: Home Location provider: [place], Office Persons participating in the virtual visit: Patient, Provider, CMA Tresa Endo C)  I discussed the limitations of evaluation and management by telemedicine and the availability of in person appointments. The patient expressed understanding and agreed to proceed.  Subjective:   HPI:   Possible ADHD- has not heard from [facility].  Pt wants to go back to school but is having a really hard time focusing at work.  Has noticed sxs 'for years' but recently things are worsening.  PCOS- started the OCPs and had a very heavy and long period (unable to go to work due to severity of bleeding) so she stopped them  Weight gain- pt notes ongoing weight gain despite not changing diet.    ROS:   See pertinent positives and negatives per HPI.  Patient Active Problem List   Diagnosis Date Noted   Positive ANA (antinuclear antibody) 03/01/2021   Facial rash 03/01/2021   Anxiety and depression 01/16/2021   Physical exam 03/30/2015   Pain of both breasts 12/22/2014   Severe obesity (BMI >= 40) (HCC) 02/10/2014   PCOS (polycystic ovarian syndrome) 02/10/2014    Social History   Tobacco Use   Smoking status: Never   Smokeless tobacco: Never  Substance Use Topics   Alcohol use: No    Current Outpatient Medications:    albuterol (VENTOLIN HFA) 108 (90 Base) MCG/ACT inhaler, INHALE 2 PUFFS BY MOUTH INTO THE LUNGS EVERY 6 HOURS AS NEEDED FOR WHEEZING OR SHORTNESS OF BREATH, Disp: 18 g, Rfl: 0   clonazePAM (KLONOPIN) 0.5 MG tablet, Take 1 tablet (0.5 mg total) by mouth daily as needed for anxiety., Disp: 20 tablet, Rfl: 1   FLUoxetine (PROZAC) 40 MG capsule, TAKE 1 CAPSULE BY MOUTH ONCE A DAY, Disp: 90 capsule,  Rfl: 1   hydrOXYzine (ATARAX/VISTARIL) 10 MG tablet, Take 1/2 to 2 tablets (5-20 mg total) by mouth 2 times daily as needed for anxiety (or for help falling asleep)., Disp: 30 tablet, Rfl: 1   norgestimate-ethinyl estradiol (ORTHO-CYCLEN) 0.25-35 MG-MCG tablet, Take 1 tablet by mouth daily., Disp: 84 tablet, Rfl: 3   Vitamin D, Ergocalciferol, (DRISDOL) 1.25 MG (50000 UNIT) CAPS capsule, Take 1 capsule (50,000 Units total) by mouth every 7 (seven) days., Disp: 12 capsule, Rfl: 0  Allergies  Allergen Reactions   Mushroom Extract Complex Hives   Penicillins Hives    Has patient had a PCN reaction causing immediate rash, facial/tongue/throat swelling, SOB or lightheadedness with hypotension: No Has patient had a PCN reaction causing severe rash involving mucus membranes or skin necrosis: No-- pt had hives Has patient had a PCN reaction that required hospitalization: No Has patient had a PCN reaction occurring within the last 10 years: No If all of the above answers are "NO", then may proceed with Cephalosporin use.     Objective:   Ht 5\' 4"  (1.626 m)    Wt 270 lb (122.5 kg) Comment: pt reported   BMI 46.35 kg/m  AAOx3, NAD NCAT, EOMI No obvious CN deficits Coloring WNL Pt is able to speak clearly, coherently without shortness of breath or increased work of breathing.  Thought process is linear.  Mood is appropriate.   Assessment and Plan:   Concentration deficit- pt has not heard  back from [facility] and she reports her sxs are interfering w/ work and her desire to go back to school.  She states she has had sxs dating back to at least high school and would like to try a medicine to improve her focus/attention/task completion.  Will start low dose Concerta- discussed that this is a controlled substance, possible side effects, appropriate use.  Prescription sent to pharmacy.  Will follow closely.  PCOS- ongoing issue.  Pt did start OCPs but stopped them after a long and  very heavy period in October.  Has GYN appt next month.  Will hold on restarting OCPs until after that appt.  Pt expressed understanding and is in agreement w/ plan.   Obesity- pt reports ongoing weight gain despite not changing her diet.  Discussed that starting a stimulant medication can help w/ weight loss and appetite suppression.  Will hold on Ozempic or similar medications at this time and start w/ Concerta instead.  Pt expressed understanding and is in agreement w/ plan.    Neena Rhymes, MD 08/29/2021

## 2021-09-12 ENCOUNTER — Other Ambulatory Visit: Payer: Self-pay

## 2021-09-12 ENCOUNTER — Encounter: Payer: Self-pay | Admitting: Pulmonary Disease

## 2021-09-12 ENCOUNTER — Ambulatory Visit (INDEPENDENT_AMBULATORY_CARE_PROVIDER_SITE_OTHER): Payer: 59 | Admitting: Pulmonary Disease

## 2021-09-12 DIAGNOSIS — G4733 Obstructive sleep apnea (adult) (pediatric): Secondary | ICD-10-CM

## 2021-09-12 NOTE — Progress Notes (Signed)
Subjective:    Patient ID: Brittany Lindsey, female    DOB: Oct 28, 1990, 31 y.o.   MRN: [id number]  HPI  Chief Complaint  Patient presents with   Consult    Sleep consult for sleep apnea. Pt does report snoring, choking and gasping for air in the middle of night, feeling tired when she wakes up and she states that her chest feels tighter. Never had a sleep study done.    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  Loud snoring has been noted by her friend and family members.  She feels like she is choking on her own spit and wakes up gasping for air.  She has always been a hard sleeper and has very difficult to wake up at times.  She Reports excessive daytime fatigue rather than sleepiness. In fact reports Epworth sleepiness score to be only 3 but I suspect that she is underestimating. Bedtime is between 9 and 11 PM, sleep latency can be minimum or be up to 1 hour, she sleeps on her side with 1 pillow, reports 3-4 nocturnal awakenings including nocturia and is out of bed by 6 AM feeling tired not refreshed but denies dryness of mouth or headaches He was on weekends she denies frequent naps and will wake up liters by 7 AM She has gained 40 pounds over the last 2 years which she attributes to PCOS  Due to this weight gain she reports dyspnea on exertion and feeling heaviness in her chest.  She drinks tea throughout the day but tries to avoid other caffeinated beverages.   PMH -clonazepam for situational anxiety ADHD, started on Concerta about 2 weeks ago this is helping her to focus more PCOS COVID infection February 2022 Serology was negative except for weakly positive ANA    Past Medical History:  Diagnosis Date   PCOS (polycystic ovarian syndrome)    Scoliosis    Tubo-ovarian abscess Feb 2016   Past Surgical History:  Procedure Laterality Date   CHROMOPERTUBATION N/A 11/02/2015   Procedure: CHROMOPERTUBATION;  Surgeon: Delsa Bern, MD;   Location: [facility];  Service: Gynecology;  Laterality: N/A;   LAPAROSCOPY Right 11/02/2015   Procedure: LAPAROSCOPY OPERATIVE with Cure of Hydrosalpynx  with Right Salpingectomy;  Surgeon: Delsa Bern, MD;  Location: [facility];  Service: Gynecology;  Laterality: Right;   LYSIS OF ADHESION N/A 11/02/2015   Procedure: EXTENSIVE LYSIS OF ADHESION;  Surgeon: Delsa Bern, MD;  Location: [facility];  Service: Gynecology;  Laterality: N/A;     Allergies  Allergen Reactions   Mushroom Extract Complex Hives   Penicillins Hives    Has patient had a PCN reaction causing immediate rash, facial/tongue/throat swelling, SOB or lightheadedness with hypotension: No Has patient had a PCN reaction causing severe rash involving mucus membranes or skin necrosis: No-- pt had hives Has patient had a PCN reaction that required hospitalization: No Has patient had a PCN reaction occurring within the last 10 years: No If all of the above answers are "NO", then may proceed with Cephalosporin use.     Social History   Socioeconomic History   Marital status: Single    Spouse name: Not on file   Number of children: Not on file   Years of education: Not on file   Highest education level: Not on file  Occupational History   Not on file  Tobacco Use   Smoking status: Never   Smokeless tobacco: Never  Vaping Use   Vaping Use:  Never used  Substance and Sexual Activity   Alcohol use: No   Drug use: No   Sexual activity: Yes    Birth control/protection: None  Other Topics Concern   Not on file  Social History Narrative   Not on file   Social Determinants of Health   Financial Resource Strain: Not on file  Food Insecurity: Not on file  Transportation Needs: Not on file  Physical Activity: Not on file  Stress: Not on file  Social Connections: Not on file  Intimate Partner Violence: Not on file      Family History  Problem Relation Age of Onset   Diabetes Mother    Seizures Mother    Hyperlipidemia  Mother    Diabetes Father    Hypertension Father    Thyroid disease Maternal Grandmother    Lupus Other      Review of Systems  Shortness of breath with activity Weight gain Anxiety   Constitutional: negative for anorexia, fevers and sweats  Eyes: negative for irritation, redness and visual disturbance  Ears, nose, mouth, throat, and face: negative for earaches, epistaxis, nasal congestion and sore throat  Respiratory: negative for cough, sputum and wheezing  Cardiovascular: negative for chest pain, lower extremity edema, orthopnea, palpitations and syncope  Gastrointestinal: negative for abdominal pain, constipation, diarrhea, melena, nausea and vomiting  Genitourinary:negative for dysuria, frequency and hematuria  Hematologic/lymphatic: negative for bleeding, easy bruising and lymphadenopathy  Musculoskeletal:negative for arthralgias, muscle weakness and stiff joints  Neurological: negative for coordination problems, gait problems, headaches and weakness  Endocrine: negative for diabetic symptoms including polydipsia, polyuria and weight loss     Objective:   Physical Exam  Gen. Pleasant, obese, in no distress, normal affect ENT - no pallor,icterus, no post nasal drip, class 2-3 airway Neck: No JVD, no thyromegaly, no carotid bruits Lungs: no use of accessory muscles, no dullness to percussion, decreased without rales or rhonchi  Cardiovascular: Rhythm regular, heart sounds  normal, no murmurs or gallops, no peripheral edema Abdomen: soft and non-tender, no hepatosplenomegaly, BS normal. Musculoskeletal: No deformities, no cyanosis or clubbing Neuro:  alert, non focal, no tremors       Assessment & Plan:    OSA - Given excessive daytime somnolence, narrow pharyngeal exam, witnessed apneas & loud snoring, obstructive sleep apnea is very likely & an overnight polysomnogram will be scheduled as a split study. The pathophysiology of obstructive sleep apnea , it's  cardiovascular consequences & modes of treatment including CPAP were discused with the patient in detail & they evidenced understanding.  If this does not show significant sleep disordered breathing then we will proceed with an MSLT to rule out narcolepsy.  Some of her symptoms of somnolence and "hard to wake up" seem to precede her weight gain.  However she has no symptoms as of cataplexy  Dyspnea -seems to be mainly deconditioning related to weight gain but if no improvement on CPAP, can consider spirometry and testing for asthma  Obesity-close correlation between OSA and weight was discussed.  She is trying weight loss measures

## 2021-09-12 NOTE — Patient Instructions (Signed)
°  X split night study

## 2021-09-17 ENCOUNTER — Telehealth: Payer: Self-pay

## 2021-09-17 DIAGNOSIS — R768 Other specified abnormal immunological findings in serum: Secondary | ICD-10-CM

## 2021-09-17 DIAGNOSIS — R21 Rash and other nonspecific skin eruption: Secondary | ICD-10-CM

## 2021-09-17 NOTE — Telephone Encounter (Signed)
We can order repeat ANA test if she is now suffering active symptoms again and concerned about this. At our previous visit we didn't find anything specific last year so I thought the previous positive may have been related to recent COVID infection, but she was also having less symptoms at the time we saw her.

## 2021-09-17 NOTE — Telephone Encounter (Signed)
Patient advised per Dr. Dimple Casey,  we can order repeat ANA test if she is now suffering active symptoms again and concerned about this. At our previous visit we didn't find anything specific last year so I thought the previous positive may have been related to recent COVID infection, but she was also having less symptoms at the time we saw her. Patient expressed understanding and future order placed. Reviewed lab hours with patient.

## 2021-09-17 NOTE — Telephone Encounter (Signed)
Patient left a Mychart message on 09/13/21:   "I had a positive ANA in 2021 after having Covid in Feb. And the provider did more blood work at came back negative and I want to get tested again to double check per his directions to get rechecked. Im also experience the same butterfly rash."  I called the patient to schedule an appointment.  Patient states she doesn't want to schedule an appointment and just wants to recheck her labwork specifically her ANA.  Patient states if Dr. Benjamine Mola cannot order the labwork she will call her PCP and see if they can order it.  Please advise.

## 2021-09-18 ENCOUNTER — Telehealth: Payer: 59 | Admitting: Family Medicine

## 2021-09-18 ENCOUNTER — Other Ambulatory Visit (HOSPITAL_COMMUNITY): Payer: Self-pay

## 2021-09-18 MED ORDER — MEDROXYPROGESTERONE ACETATE 10 MG PO TABS
ORAL_TABLET | ORAL | 0 refills | Status: DC
  Filled 2021-09-18: qty 10, 10d supply, fill #0

## 2021-09-18 MED ORDER — LEVONORGESTREL-ETHINYL ESTRAD 0.1-20 MG-MCG PO TABS
1.0000 | ORAL_TABLET | Freq: Every day | ORAL | 1 refills | Status: DC
  Filled 2021-09-18: qty 84, 84d supply, fill #0

## 2021-09-20 ENCOUNTER — Encounter: Payer: Self-pay | Admitting: Family Medicine

## 2021-09-20 ENCOUNTER — Ambulatory Visit (HOSPITAL_BASED_OUTPATIENT_CLINIC_OR_DEPARTMENT_OTHER): Payer: Managed Care, Other (non HMO) | Attending: Pulmonary Disease | Admitting: Pulmonary Disease

## 2021-09-20 ENCOUNTER — Other Ambulatory Visit: Payer: Self-pay

## 2021-09-20 DIAGNOSIS — Z6841 Body Mass Index (BMI) 40.0 and over, adult: Secondary | ICD-10-CM | POA: Diagnosis not present

## 2021-09-20 DIAGNOSIS — G4733 Obstructive sleep apnea (adult) (pediatric): Secondary | ICD-10-CM | POA: Diagnosis present

## 2021-09-23 ENCOUNTER — Telehealth: Payer: Self-pay | Admitting: Pulmonary Disease

## 2021-09-23 DIAGNOSIS — G4733 Obstructive sleep apnea (adult) (pediatric): Secondary | ICD-10-CM | POA: Diagnosis not present

## 2021-09-23 NOTE — Procedures (Signed)
Patient Name: Brittany Lindsey, Brittany Lindsey Date: 09/20/2021 Gender: Female D.O.B: Jun 21, 1991 Age (years): 30 Referring Provider: Cyril Mourning MD, ABSM Height (inches): 64 Interpreting Physician: Cyril Mourning MD, ABSM Weight (lbs): 286 RPSGT: Lowry Ram BMI: 49 MRN: [id number] Neck Size: 16.00 <br> <br> CLINICAL INFORMATION Sleep Study Type: NPSG    Indication for sleep study: Depression, Morbid Obesity, Snoring    Epworth Sleepiness Score: 12    SLEEP STUDY TECHNIQUE As per the AASM Manual for the Scoring of Sleep and Associated Events v2.3 (April 2016) with a hypopnea requiring 4% desaturations.  The channels recorded and monitored were frontal, central and occipital EEG, electrooculogram (EOG), submentalis EMG (chin), nasal and oral airflow, thoracic and abdominal wall motion, anterior tibialis EMG, snore microphone, electrocardiogram, and pulse oximetry.  MEDICATIONS Medications self-administered by patient taken the night of the study : N/A  SLEEP ARCHITECTURE The study was initiated at 10:31:02 PM and ended at 5:44:08 AM.  Sleep onset time was 5.5 minutes and the sleep efficiency was 87.7%%. The total sleep time was 380 minutes.  Stage REM latency was 94.0 minutes.  The patient spent 8.4%% of the night in stage N1 sleep, 72.8%% in stage N2 sleep, 0.0%% in stage N3 and 18.8% in REM.  Alpha intrusion was absent.  Supine sleep was 18.29%.  RESPIRATORY PARAMETERS The overall apnea/hypopnea index (AHI) was 5.4 per hour. There were 1 total apneas, including 1 obstructive, 0 central and 0 mixed apneas. There were 33 hypopneas and 10 RERAs.  The AHI during Stage REM sleep was 24.3 per hour.  AHI while supine was 10.4 per hour.  The mean oxygen saturation was 96.2%. The minimum SpO2 during sleep was 88.0%.  moderate snoring was noted during this study.  CARDIAC DATA The 2 lead EKG demonstrated sinus rhythm. The mean heart rate was 75.2 beats per minute. Other EKG  findings include: None.   LEG MOVEMENT DATA The total PLMS were 0 with a resulting PLMS index of 0.0. Associated arousal with leg movement index was 3.5 .  IMPRESSIONS - Mild obstructive sleep apnea occurred during this study (AHI = 5.4/h). - The patient had minimal or no oxygen desaturation during the study (Min O2 = 88.0%) - The patient snored with moderate snoring volume. - No cardiac abnormalities were noted during this study. - Clinically significant periodic limb movements did not occur during sleep. No significant associated arousals.   DIAGNOSIS - Obstructive Sleep Apnea (G47.33)   RECOMMENDATIONS - Positional therapy avoiding supine position during sleep. - Very mild obstructive sleep apnea. Return to discuss treatment options including dental appliance or CPAP. - Hypersomnolence seems to be out of proportion to degree of sleep disordered breathing. Consider MSLT to evaluate for narcolepsy - Avoid alcohol, sedatives and other CNS depressants that may worsen sleep apnea and disrupt normal sleep architecture. - Sleep hygiene should be reviewed to assess factors that may improve sleep quality. - Weight management and regular exercise should be initiated or continued if appropriate.   Cyril Mourning MD Board Certified in Sleep medicine

## 2021-09-23 NOTE — Telephone Encounter (Signed)
Very mild OSA , AHI 5.4/h with low sat 88% Im not convinced that all her symptoms are due to OSA, but will need a treatment trial of CPAP before we can investigate more  Suggest Rx for autoCPAP 5-12 cm with Airfit F20 size medium & OV with me 1 month after using If no better or still sleepy, we can consider investigating for narcolepsy

## 2021-09-27 NOTE — Telephone Encounter (Signed)
ATC patient to go over results, unable to leave VM due to it not being set up will try again later

## 2021-10-01 ENCOUNTER — Encounter: Payer: Self-pay | Admitting: Pulmonary Disease

## 2021-10-01 DIAGNOSIS — G4733 Obstructive sleep apnea (adult) (pediatric): Secondary | ICD-10-CM

## 2021-10-02 NOTE — Telephone Encounter (Signed)
Message has been sent to patient with results awaiting response from her  Very mild OSA , AHI 5.4/h with low sat 88% Im not convinced that all her symptoms are due to OSA, but will need a treatment trial of CPAP before we can investigate more   Suggest Rx for autoCPAP 5-12 cm with Airfit F20 size medium & OV with me 1 month after using If no better or still sleepy, we can consider investigating for narcolepsy

## 2021-10-02 NOTE — Telephone Encounter (Signed)
Dr Vassie Loll, please advise on pt email, thanks!  Well I guess if its to rule out certain things I will try the machine but during the day I struggle tremendously with sleep I can pretty much fall asleep anywhere. Being cold does not help either

## 2021-10-02 NOTE — Telephone Encounter (Signed)
ATC patient to go over results unable to leave message due to mailbox not being set up yet. She has sent mychart message and asked for results. Results have been sent with treatment options. Waiting to hear back from her. I am going to close this encounter, please see mychart encounter for further information.

## 2021-10-02 NOTE — Telephone Encounter (Signed)
Dr Vassie Loll- please advise on results of split night. Thanks!

## 2021-10-03 ENCOUNTER — Other Ambulatory Visit (HOSPITAL_COMMUNITY): Payer: Self-pay

## 2021-10-03 ENCOUNTER — Other Ambulatory Visit: Payer: Self-pay | Admitting: Family Medicine

## 2021-10-03 ENCOUNTER — Other Ambulatory Visit: Payer: Self-pay | Admitting: Registered Nurse

## 2021-10-03 ENCOUNTER — Encounter: Payer: Self-pay | Admitting: Family Medicine

## 2021-10-03 DIAGNOSIS — F418 Other specified anxiety disorders: Secondary | ICD-10-CM

## 2021-10-03 MED ORDER — FLUOXETINE HCL 40 MG PO CAPS
ORAL_CAPSULE | Freq: Every day | ORAL | 1 refills | Status: DC
  Filled 2021-10-03: qty 90, 90d supply, fill #0

## 2021-10-03 MED ORDER — METHYLPHENIDATE HCL ER (OSM) 18 MG PO TBCR
18.0000 mg | EXTENDED_RELEASE_TABLET | Freq: Every day | ORAL | 0 refills | Status: DC
  Filled 2021-10-03: qty 30, 30d supply, fill #0
  Filled 2021-10-04: qty 10, 10d supply, fill #0

## 2021-10-03 MED ORDER — HYDROXYZINE HCL 10 MG PO TABS
5.0000 mg | ORAL_TABLET | Freq: Two times a day (BID) | ORAL | 1 refills | Status: DC | PRN
Start: 2021-10-03 — End: 2022-01-06
  Filled 2021-10-03: qty 30, 8d supply, fill #0

## 2021-10-03 MED ORDER — CLONAZEPAM 0.5 MG PO TABS
0.5000 mg | ORAL_TABLET | Freq: Every day | ORAL | 1 refills | Status: DC | PRN
  Filled 2021-10-03: qty 20, 20d supply, fill #0

## 2021-10-03 NOTE — Telephone Encounter (Signed)
Pt is concerned due to spells of intense anger as well as some vision changes during one of these episodes notes Concerta has been working okay so far other than this   Any advice that can be given or is there a more appropriate action?

## 2021-10-03 NOTE — Telephone Encounter (Signed)
Pt is following up on previous message about letter of necessity

## 2021-10-03 NOTE — Telephone Encounter (Signed)
FYI:  Pt reports she has stopped fluoxetine after starting concerta, pt does note she has an appointment for the 3rd

## 2021-10-04 ENCOUNTER — Telehealth: Payer: Self-pay

## 2021-10-04 ENCOUNTER — Other Ambulatory Visit (HOSPITAL_COMMUNITY): Payer: Self-pay

## 2021-10-04 MED ORDER — METHYLPHENIDATE HCL ER (OSM) 18 MG PO TBCR: 18.0000 mg | EXTENDED_RELEASE_TABLET | Freq: Every day | ORAL | 0 refills | Status: DC

## 2021-10-04 NOTE — Telephone Encounter (Signed)
Patient aware.

## 2021-10-04 NOTE — Telephone Encounter (Signed)
Caller name:[name]   On DPR? :Yes  Call back [phone]  Provider they see: Beverely Low   Reason for call:methylphenidate (CONCERTA) 18 MG PO CR tablet  this was sent to [facility] and they are on back order can this be sent to [facility] on [address]

## 2021-10-04 NOTE — Telephone Encounter (Signed)
Prescription sent to [facility] as requested.

## 2021-10-11 ENCOUNTER — Encounter: Payer: Self-pay | Admitting: Family Medicine

## 2021-10-11 ENCOUNTER — Telehealth (INDEPENDENT_AMBULATORY_CARE_PROVIDER_SITE_OTHER): Payer: Managed Care, Other (non HMO) | Admitting: Family Medicine

## 2021-10-11 DIAGNOSIS — F32A Depression, unspecified: Secondary | ICD-10-CM | POA: Diagnosis not present

## 2021-10-11 DIAGNOSIS — F419 Anxiety disorder, unspecified: Secondary | ICD-10-CM

## 2021-10-11 MED ORDER — BUPROPION HCL ER (XL) 150 MG PO TB24: 150.0000 mg | ORAL_TABLET | Freq: Every day | ORAL | 3 refills | Status: DC

## 2021-10-11 NOTE — Progress Notes (Signed)
Virtual Visit via Video   I connected with patient on 10/11/21 at 10:30 AM EST by a video enabled telemedicine application and verified that I am speaking with the correct person using two identifiers.  Location patient: Home Location provider: [facility], Office Persons participating in the virtual visit: Patient, Provider, CMA Tresa Endo C)  I discussed the limitations of evaluation and management by telemedicine and the availability of in person appointments. The patient expressed understanding and agreed to proceed.  Subjective:   HPI:   Anxiety- currently prescribed Fluoxetine 40mg  daily.  Stopped the medication b/c it made her sleepy.  Has Hydroxyzine to help w/ sleep.  Sent a message last week that she was having almost panicked type rage episodes.  Pt got in her car, started to drive, started to cry, then started driving very slowly, felt light headed and had to pull over.  Ended up sitting for about an hour to calm down.  Feels she can't control her emotions.  Pt reports Clonazepam was not helpful.  Feels Concerta helps w/ anxiety but does not help w/ focus.  ROS:   See pertinent positives and negatives per HPI.  Patient Active Problem List   Diagnosis Date Noted   Positive ANA (antinuclear antibody) 03/01/2021   Facial rash 03/01/2021   Anxiety and depression 01/16/2021   Physical exam 03/30/2015   Pain of both breasts 12/22/2014   Severe obesity (BMI >= 40) (HCC) 02/10/2014   PCOS (polycystic ovarian syndrome) 02/10/2014    Social History   Tobacco Use   Smoking status: Never   Smokeless tobacco: Never  Substance Use Topics   Alcohol use: No    Current Outpatient Medications:    albuterol (VENTOLIN HFA) 108 (90 Base) MCG/ACT inhaler, INHALE 2 PUFFS BY MOUTH INTO THE LUNGS EVERY 6 HOURS AS NEEDED FOR WHEEZING OR SHORTNESS OF BREATH, Disp: 18 g, Rfl: 0   FLUoxetine (PROZAC) 40 MG capsule, TAKE 1 CAPSULE BY MOUTH ONCE A DAY, Disp: 90 capsule, Rfl: 1    hydrOXYzine (ATARAX) 10 MG tablet, Take 1/2 to 2 tablets by mouth 2 times daily as needed for anxiety (or for help falling asleep)., Disp: 30 tablet, Rfl: 1   levonorgestrel-ethinyl estradiol (ALESSE) 0.1-20 MG-MCG tablet, Take one tablet by mouth daily., Disp: 84 tablet, Rfl: 1   medroxyPROGESTERone (PROVERA) 10 MG tablet, Take one tablet (10 mg dose) by mouth daily., Disp: 10 tablet, Rfl: 0   methylphenidate (CONCERTA) 18 MG PO CR tablet, Take 1 tablet (18 mg total) by mouth daily., Disp: 30 tablet, Rfl: 0   Vitamin D, Ergocalciferol, (DRISDOL) 1.25 MG (50000 UNIT) CAPS capsule, Take 1 capsule (50,000 Units total) by mouth every 7 (seven) days., Disp: 12 capsule, Rfl: 0  Allergies  Allergen Reactions   Mushroom Extract Complex Hives   Penicillins Hives    Has patient had a PCN reaction causing immediate rash, facial/tongue/throat swelling, SOB or lightheadedness with hypotension: No Has patient had a PCN reaction causing severe rash involving mucus membranes or skin necrosis: No-- pt had hives Has patient had a PCN reaction that required hospitalization: No Has patient had a PCN reaction occurring within the last 10 years: No If all of the above answers are "NO", then may proceed with Cephalosporin use.     Objective:   There were no vitals taken for this visit. AAOx3, NAD NCAT, EOMI No obvious CN deficits Coloring WNL Pt is able to speak clearly, coherently without shortness of breath or increased work of breathing.  Thought process is linear.  Mood is appropriate.   Assessment and Plan:   Anxiety/Depression- deteriorated.  Now having episodes where she finds it hard to control her emotions.  Described as 'panicked rage'.  I told her that it was not appropriate to get in the car during these episodes as she could harm herself or others.  She stopped her Fluoxetine when she started the Concerta so at this time, is on nothing for mood.  Did not find Clonazepam helpful when taken prn.   Given her mixed anxiety/depression/ADHD picture, will start Wellbutrin and monitor closely for improvement.  Pt expressed understanding and is in agreement w/ plan.    Neena Rhymes, MD 10/11/2021

## 2021-10-14 ENCOUNTER — Other Ambulatory Visit (HOSPITAL_COMMUNITY): Payer: Self-pay

## 2021-10-14 MED ORDER — MEDROXYPROGESTERONE ACETATE 10 MG PO TABS
ORAL_TABLET | ORAL | 0 refills | Status: DC
  Filled 2021-10-14: qty 20, 10d supply, fill #0

## 2021-10-23 NOTE — Telephone Encounter (Signed)
Will forward to PCC's to inform [facility] that pt has an updated number. Thank you!

## 2021-10-24 NOTE — Telephone Encounter (Signed)
I called [facility] and spoke to [place].  They had been trying to contact pt at old #.  Gave her new phone #.  Will route back to triage for them to make pt aware and to close out message.

## 2021-11-08 ENCOUNTER — Encounter: Payer: Self-pay | Admitting: Pulmonary Disease

## 2021-11-08 NOTE — Telephone Encounter (Signed)
Dr. Elsworth Soho, please see mychart messages sent by pt and advise. Also, please see attached EKG in media section of that message. ?

## 2021-11-11 ENCOUNTER — Encounter: Payer: Self-pay | Admitting: Family Medicine

## 2021-11-11 ENCOUNTER — Other Ambulatory Visit: Payer: Self-pay | Admitting: Family Medicine

## 2021-11-12 MED ORDER — BUDESONIDE-FORMOTEROL FUMARATE 80-4.5 MCG/ACT IN AERO: 2.0000 | INHALATION_SPRAY | Freq: Two times a day (BID) | RESPIRATORY_TRACT | 5 refills | Status: DC

## 2021-11-12 MED ORDER — METHYLPHENIDATE HCL ER (OSM) 18 MG PO TBCR: 18.0000 mg | EXTENDED_RELEASE_TABLET | Freq: Every day | ORAL | 0 refills | Status: DC

## 2021-11-20 LAB — HM PAP SMEAR

## 2021-11-28 ENCOUNTER — Encounter: Payer: Self-pay | Admitting: Family Medicine

## 2021-12-02 ENCOUNTER — Encounter: Payer: Self-pay | Admitting: Family Medicine

## 2021-12-02 ENCOUNTER — Telehealth (INDEPENDENT_AMBULATORY_CARE_PROVIDER_SITE_OTHER): Payer: Managed Care, Other (non HMO) | Admitting: Family Medicine

## 2021-12-02 DIAGNOSIS — F419 Anxiety disorder, unspecified: Secondary | ICD-10-CM | POA: Diagnosis not present

## 2021-12-02 DIAGNOSIS — F32A Depression, unspecified: Secondary | ICD-10-CM | POA: Diagnosis not present

## 2021-12-02 MED ORDER — FLUOXETINE HCL 40 MG PO CAPS: ORAL_CAPSULE | Freq: Every day | ORAL | 1 refills | Status: DC

## 2021-12-02 NOTE — Progress Notes (Signed)
? ?Virtual Visit via Video  ? ?I connected with patient on 12/02/21 at 11:30 AM EDT by a video enabled telemedicine application and verified that I am speaking with the correct person using two identifiers. ? ?Location patient: Home ?Location provider: [facility], Office ?Persons participating in the virtual visit: Patient, Provider, CMA Tresa Endo C) ? ?I discussed the limitations of evaluation and management by telemedicine and the availability of in person appointments. The patient expressed understanding and agreed to proceed. ? ?Subjective:  ? ?HPI:  ? ?Anxiety/Depression- at last visit was started on Wellbutrin.  Pt reports that most of the time she is 'unhappy and irritable'.  Doesn't feel like interacting w/ people.  Pt reports that when she initially started Prozac it was effective but as time went on, it became less effective. ? ?ROS:  ? ?See pertinent positives and negatives per HPI. ? ?Patient Active Problem List  ? Diagnosis Date Noted  ? Positive ANA (antinuclear antibody) 03/01/2021  ? Facial rash 03/01/2021  ? Anxiety and depression 01/16/2021  ? Physical exam 03/30/2015  ? Pain of both breasts 12/22/2014  ? Severe obesity (BMI >= 40) (HCC) 02/10/2014  ? PCOS (polycystic ovarian syndrome) 02/10/2014  ?  ?Social History  ? ?Tobacco Use  ? Smoking status: Never  ? Smokeless tobacco: Never  ?Substance Use Topics  ? Alcohol use: No  ? ? ?Current Outpatient Medications:  ?  budesonide-formoterol (SYMBICORT) 80-4.5 MCG/ACT inhaler, Inhale 2 puffs into the lungs 2 (two) times daily., Disp: 1 each, Rfl: 5 ?  buPROPion (WELLBUTRIN XL) 150 MG 24 hr tablet, Take 1 tablet (150 mg total) by mouth daily., Disp: 30 tablet, Rfl: 3 ?  hydrOXYzine (ATARAX) 10 MG tablet, Take 1/2 to 2 tablets by mouth 2 times daily as needed for anxiety (or for help falling asleep)., Disp: 30 tablet, Rfl: 1 ?  levonorgestrel-ethinyl estradiol (ALESSE) 0.1-20 MG-MCG tablet, Take one tablet by mouth daily., Disp: 84 tablet, Rfl:  1 ?  methylphenidate (CONCERTA) 18 MG PO CR tablet, Take 1 tablet (18 mg total) by mouth daily., Disp: 30 tablet, Rfl: 0 ?  Vitamin D, Ergocalciferol, (DRISDOL) 1.25 MG (50000 UNIT) CAPS capsule, Take 1 capsule (50,000 Units total) by mouth every 7 (seven) days., Disp: 12 capsule, Rfl: 0 ?  albuterol (VENTOLIN HFA) 108 (90 Base) MCG/ACT inhaler, INHALE 2 PUFFS BY MOUTH INTO THE LUNGS EVERY 6 HOURS AS NEEDED FOR WHEEZING OR SHORTNESS OF BREATH, Disp: 18 g, Rfl: 0 ?  medroxyPROGESTERone (PROVERA) 10 MG tablet, Take two tablets (20 mg dose) by mouth daily for 10 days. (Patient not taking: Reported on 12/02/2021), Disp: 20 tablet, Rfl: 0 ? ?Allergies  ?Allergen Reactions  ? Mushroom Extract Complex Hives  ? Penicillins Hives  ?  Has patient had a PCN reaction causing immediate rash, facial/tongue/throat swelling, SOB or lightheadedness with hypotension: No ?Has patient had a PCN reaction causing severe rash involving mucus membranes or skin necrosis: No-- pt had hives ?Has patient had a PCN reaction that required hospitalization: No ?Has patient had a PCN reaction occurring within the last 10 years: No ?If all of the above answers are "NO", then may proceed with Cephalosporin use. ?  ? ? ?Objective:  ? ?There were no vitals taken for this visit. ?AAOx3, NAD ?NCAT, EOMI ?No obvious CN deficits ?Coloring WNL ?Pt is able to speak clearly, coherently without shortness of breath or increased work of breathing.  ?Thought process is linear.  Mood is appropriate.  ? ?Assessment and Plan:  ? ?  Anxiety and depression- ongoing issue for pt.  Continues to struggle w/ generalized unhappiness and irritability.  Will restart Prozac alongside her recently started Wellbutrin in hopes that this will help.  Did discuss counseling but pt feels she knows what the problem is and isn't sure how counseling will be helpful.  Will continue to follow closely at upcoming appts. ? ? ?Neena Rhymes, MD ?12/02/2021 ? ?

## 2021-12-14 ENCOUNTER — Other Ambulatory Visit: Payer: Self-pay | Admitting: Family Medicine

## 2021-12-16 ENCOUNTER — Other Ambulatory Visit: Payer: Self-pay | Admitting: Family Medicine

## 2021-12-16 ENCOUNTER — Encounter: Payer: Self-pay | Admitting: Family Medicine

## 2021-12-16 NOTE — Telephone Encounter (Signed)
Patient is requesting a refill of the following medications: ?Requested Prescriptions  ? ?Pending Prescriptions Disp Refills  ? methylphenidate (CONCERTA) 18 MG PO CR tablet 30 tablet 0  ?  Sig: Take 1 tablet (18 mg total) by mouth daily.  ? ? ?Date of patient request: 12/16/21 ?Last office visit: 08/29/21 ?Date of last refill: 11/12/21 ?Last refill amount: 30 ? ? ?

## 2021-12-17 MED ORDER — METHYLPHENIDATE HCL ER (OSM) 18 MG PO TBCR: 18.0000 mg | EXTENDED_RELEASE_TABLET | Freq: Every day | ORAL | 0 refills | Status: DC

## 2021-12-18 ENCOUNTER — Encounter: Payer: Self-pay | Admitting: Family Medicine

## 2021-12-23 ENCOUNTER — Ambulatory Visit: Payer: 59 | Admitting: Family Medicine

## 2021-12-30 ENCOUNTER — Ambulatory Visit: Payer: Managed Care, Other (non HMO) | Admitting: Adult Health

## 2021-12-30 ENCOUNTER — Ambulatory Visit: Payer: 59 | Admitting: Family Medicine

## 2022-01-06 ENCOUNTER — Telehealth (INDEPENDENT_AMBULATORY_CARE_PROVIDER_SITE_OTHER): Payer: 59 | Admitting: Family Medicine

## 2022-01-06 ENCOUNTER — Encounter: Payer: Self-pay | Admitting: Family Medicine

## 2022-01-06 DIAGNOSIS — F419 Anxiety disorder, unspecified: Secondary | ICD-10-CM

## 2022-01-06 DIAGNOSIS — F32A Depression, unspecified: Secondary | ICD-10-CM

## 2022-01-06 DIAGNOSIS — K625 Hemorrhage of anus and rectum: Secondary | ICD-10-CM | POA: Diagnosis not present

## 2022-01-06 NOTE — Progress Notes (Signed)
? ?Virtual Visit via Video  ? ?I connected with patient on 01/06/22 at 10:20 AM EDT by a video enabled telemedicine application and verified that I am speaking with the correct person using two identifiers. ? ?Location patient: Home ?Location provider: [facility], Office ?Persons participating in the virtual visit: Patient, Provider, CMA Tresa Endo C) ? ?I discussed the limitations of evaluation and management by telemedicine and the availability of in person appointments. The patient expressed understanding and agreed to proceed. ? ?Subjective:  ? ?HPI:  ? ?Anxiety/Depression- pt was restarted on Prozac 40mg  at last visit.  This was in addition to her Wellbutrin 150mg  daily.  Pt reports things are 'going a lot better'.  'A whole big turn around'.  Pt states she is in a good mood, has increased patience.  'i can tell a huge difference'.  Pt feels she's better able to handle mom and ongoing health issues.  Work is no longer making her 'down and depressed'.  Is showing more positivity.  Pt is not interested in making any changes at this time. ? ?BRBPR- pt reports she feels that there is 'a knot' and there is pressure when she has a BM.  Pt has had blood in underwear.  Has had blood on top of stool and blood in bowl.  No pain.  At times amount of blood can be significant.  Also has bleeding w/o BM. ? ?ROS:  ? ?See pertinent positives and negatives per HPI. ? ?Patient Active Problem List  ? Diagnosis Date Noted  ? Positive ANA (antinuclear antibody) 03/01/2021  ? Facial rash 03/01/2021  ? Anxiety and depression 01/16/2021  ? Physical exam 03/30/2015  ? Pain of both breasts 12/22/2014  ? Severe obesity (BMI >= 40) (HCC) 02/10/2014  ? PCOS (polycystic ovarian syndrome) 02/10/2014  ?  ?Social History  ? ?Tobacco Use  ? Smoking status: Never  ? Smokeless tobacco: Never  ?Substance Use Topics  ? Alcohol use: No  ? ? ?Current Outpatient Medications:  ?  budesonide-formoterol (SYMBICORT) 80-4.5 MCG/ACT inhaler, Inhale 2  puffs into the lungs 2 (two) times daily., Disp: 1 each, Rfl: 5 ?  buPROPion (WELLBUTRIN XL) 150 MG 24 hr tablet, Take 1 tablet (150 mg total) by mouth daily., Disp: 30 tablet, Rfl: 3 ?  FLUoxetine (PROZAC) 40 MG capsule, TAKE 1 CAPSULE BY MOUTH ONCE A DAY, Disp: 90 capsule, Rfl: 1 ?  methylphenidate (CONCERTA) 18 MG PO CR tablet, Take 1 tablet (18 mg total) by mouth daily., Disp: 30 tablet, Rfl: 0 ?  Vitamin D, Ergocalciferol, (DRISDOL) 1.25 MG (50000 UNIT) CAPS capsule, Take 1 capsule (50,000 Units total) by mouth every 7 (seven) days., Disp: 12 capsule, Rfl: 0 ?  albuterol (VENTOLIN HFA) 108 (90 Base) MCG/ACT inhaler, INHALE 2 PUFFS BY MOUTH INTO THE LUNGS EVERY 6 HOURS AS NEEDED FOR WHEEZING OR SHORTNESS OF BREATH, Disp: 18 g, Rfl: 0 ?  hydrOXYzine (ATARAX) 10 MG tablet, Take 1/2 to 2 tablets by mouth 2 times daily as needed for anxiety (or for help falling asleep)., Disp: 30 tablet, Rfl: 1 ?  levonorgestrel-ethinyl estradiol (ALESSE) 0.1-20 MG-MCG tablet, Take one tablet by mouth daily., Disp: 84 tablet, Rfl: 1 ? ?Allergies  ?Allergen Reactions  ? Mushroom Extract Complex Hives  ? Penicillins Hives  ?  Has patient had a PCN reaction causing immediate rash, facial/tongue/throat swelling, SOB or lightheadedness with hypotension: No ?Has patient had a PCN reaction causing severe rash involving mucus membranes or skin necrosis: No-- pt had hives ?Has  patient had a PCN reaction that required hospitalization: No ?Has patient had a PCN reaction occurring within the last 10 years: No ?If all of the above answers are "NO", then may proceed with Cephalosporin use. ?  ? ? ?Objective:  ? ?There were no vitals taken for this visit. ?AAOx3, NAD ?NCAT, EOMI ?No obvious CN deficits ?Coloring WNL ?Pt is able to speak clearly, coherently without shortness of breath or increased work of breathing.  ?Thought process is linear.  Mood is appropriate. ? ? ?Assessment and Plan:  ? ?Anxiety/Depression- much improved w/ addition of  Fluoxetine 40mg  daily.  Pt reports a huge and noticeable change.  Continue Fluoxetine and Wellbutrin.  No med changes at this time. ? ?BRBPR- new.  Pt reports she is now having bleeding w/o having a BM.  Can feel a 'knot' when she has a BM.  Needs GI referral for complete evaluation.  Referral placed. ? ? ? , MD ?01/06/2022 ? ?  ?

## 2022-01-29 ENCOUNTER — Other Ambulatory Visit (HOSPITAL_COMMUNITY): Payer: Self-pay

## 2022-01-29 ENCOUNTER — Encounter: Payer: Self-pay | Admitting: Family Medicine

## 2022-01-29 ENCOUNTER — Ambulatory Visit (INDEPENDENT_AMBULATORY_CARE_PROVIDER_SITE_OTHER): Payer: Managed Care, Other (non HMO) | Admitting: Family Medicine

## 2022-01-29 DIAGNOSIS — E282 Polycystic ovarian syndrome: Secondary | ICD-10-CM

## 2022-01-29 DIAGNOSIS — R61 Generalized hyperhidrosis: Secondary | ICD-10-CM | POA: Diagnosis not present

## 2022-01-29 DIAGNOSIS — R030 Elevated blood-pressure reading, without diagnosis of hypertension: Secondary | ICD-10-CM | POA: Insufficient documentation

## 2022-01-29 LAB — CBC WITH DIFFERENTIAL/PLATELET
Basophils Absolute: 0.1 10*3/uL (ref 0.0–0.1)
Basophils Relative: 0.8 % (ref 0.0–3.0)
Eosinophils Absolute: 0.1 10*3/uL (ref 0.0–0.7)
Eosinophils Relative: 1.6 % (ref 0.0–5.0)
HCT: 42.8 % (ref 36.0–46.0)
Hemoglobin: 14.3 g/dL (ref 12.0–15.0)
Lymphocytes Relative: 26.4 % (ref 12.0–46.0)
Lymphs Abs: 1.7 10*3/uL (ref 0.7–4.0)
MCHC: 33.3 g/dL (ref 30.0–36.0)
MCV: 84.8 fl (ref 78.0–100.0)
Monocytes Absolute: 0.4 10*3/uL (ref 0.1–1.0)
Monocytes Relative: 6.3 % (ref 3.0–12.0)
Neutro Abs: 4.1 10*3/uL (ref 1.4–7.7)
Neutrophils Relative %: 64.9 % (ref 43.0–77.0)
Platelets: 256 10*3/uL (ref 150.0–400.0)
RBC: 5.04 Mil/uL (ref 3.87–5.11)
RDW: 13.5 % (ref 11.5–15.5)
WBC: 6.3 10*3/uL (ref 4.0–10.5)

## 2022-01-29 LAB — HEMOGLOBIN A1C: Hgb A1c MFr Bld: 5.7 % (ref 4.6–6.5)

## 2022-01-29 LAB — HEPATIC FUNCTION PANEL
ALT: 36 U/L — ABNORMAL HIGH (ref 0–35)
AST: 26 U/L (ref 0–37)
Albumin: 4.4 g/dL (ref 3.5–5.2)
Alkaline Phosphatase: 67 U/L (ref 39–117)
Bilirubin, Direct: 0.1 mg/dL (ref 0.0–0.3)
Total Bilirubin: 0.7 mg/dL (ref 0.2–1.2)
Total Protein: 7.3 g/dL (ref 6.0–8.3)

## 2022-01-29 LAB — BASIC METABOLIC PANEL
BUN: 8 mg/dL (ref 6–23)
CO2: 29 mEq/L (ref 19–32)
Calcium: 9.2 mg/dL (ref 8.4–10.5)
Chloride: 102 mEq/L (ref 96–112)
Creatinine, Ser: 0.77 mg/dL (ref 0.40–1.20)
GFR: 101.69 mL/min (ref >60.00)
Glucose, Bld: 112 mg/dL — ABNORMAL HIGH (ref 70–99)
Potassium: 4.1 mEq/L (ref 3.5–5.1)
Sodium: 138 mEq/L (ref 135–145)

## 2022-01-29 LAB — LDL CHOLESTEROL, DIRECT: Direct LDL: 107 mg/dL

## 2022-01-29 LAB — LIPID PANEL
Cholesterol: 167 mg/dL (ref 0–200)
HDL: 44.3 mg/dL (ref >39.00)
NonHDL: 122.65
Total CHOL/HDL Ratio: 4
Triglycerides: 236 mg/dL — ABNORMAL HIGH (ref 0.0–149.0)
VLDL: 47.2 mg/dL — ABNORMAL HIGH (ref 0.0–40.0)

## 2022-01-29 LAB — T4, FREE: Free T4: 0.73 ng/dL (ref 0.60–1.60)

## 2022-01-29 LAB — TSH: TSH: 1.57 u[IU]/mL (ref 0.35–5.50)

## 2022-01-29 LAB — T3, FREE: T3, Free: 3.4 pg/mL (ref 2.3–4.2)

## 2022-01-29 MED ORDER — NORETHIN ACE-ETH ESTRAD-FE 1-20 MG-MCG PO TABS
1.0000 | ORAL_TABLET | Freq: Every day | ORAL | 4 refills | Status: DC
  Filled 2022-01-29: qty 84, 84d supply, fill #0
  Filled 2022-03-28: qty 84, 84d supply, fill #1

## 2022-01-29 NOTE — Assessment & Plan Note (Signed)
Ongoing issue for pt.  Had recent US w/ GYN that confirmed multiple cysts on both ovaries.  I suspect that hormones have a lot to do w/ her night sweats and hot flashes as she is describing vasomotor sxs.  Encouraged low carb diet.  May need to start Spironolactone at next visit to treat both BP and increased facial hair.  Pt expressed understanding and is in agreement w/ plan.

## 2022-01-29 NOTE — Assessment & Plan Note (Signed)
New.  Pt is describing 2 separate situations.  The episode that occurred on Sunday that resolved with eating is consistent w/ low blood sugar.  She had gone 7 hrs between eating when her sxs started.  The other episodes she is describing- including night sweats- sound like vasomotor sxs consistent w/ hormonal variations.  She does agree that sxs worsened after stopping OCPs.  Encouraged her to reach out to GYN to resume these to get her sxs under better control.  Check labs to r/o other possible causes such as hyperthyroidism.  Will follow.

## 2022-01-29 NOTE — Progress Notes (Signed)
   Subjective:    Patient ID: Brittany Lindsey, female    DOB: [birth year], 31 y.o.   MRN: [id number]  HPI Excessive sweating- pt reports for the last few months she has had instances where she gets overwhelmingly hot and sweaty.  'my hair will be drenched'.  Will sweat at night.  Hot flashes worsened when GYN stopped OCPs.  pt reports she had an episode on Sunday and on Tuesday of feeling 'hot and sweaty'.  Says it felt similar to when she was taking the Metformin and her sugar would drop low.  On Sunday she had not eaten since 6am and it was 1pm.  She felt better after eating.  Yesterday's episode did not improve w/ eating and afterwards she was extremely fatigued.  Elevated BP- pt reports during her hot and sweaty episode yesterday BP was as high as 160/90.  Upon recheck was 142/90.    Obesity- pt has gained 5 lbs since October   Review of Systems For ROS see HPI     Objective:   Physical Exam Vitals reviewed.  Constitutional:      General: She is not in acute distress.    Appearance: Normal appearance. She is well-developed. She is obese.     Comments: Hirsute  HENT:     Head: Normocephalic and atraumatic.  Eyes:     Conjunctiva/sclera: Conjunctivae normal.     Pupils: Pupils are equal, round, and reactive to light.  Neck:     Thyroid: No thyromegaly.  Cardiovascular:     Rate and Rhythm: Normal rate and regular rhythm.     Heart sounds: Normal heart sounds. No murmur heard. Pulmonary:     Effort: Pulmonary effort is normal. No respiratory distress.     Breath sounds: Normal breath sounds.  Abdominal:     General: There is no distension.     Palpations: Abdomen is soft.     Tenderness: There is no abdominal tenderness.  Musculoskeletal:     Cervical back: Normal range of motion and neck supple.  Lymphadenopathy:     Cervical: No cervical adenopathy.  Skin:    General: Skin is warm and dry.  Neurological:     Mental Status: She is alert and oriented to person, place,  and time.  Psychiatric:        Behavior: Behavior normal.          Assessment & Plan:

## 2022-01-29 NOTE — Patient Instructions (Addendum)
Follow up in 6-8 weeks to recheck BP We'll determine the next steps based on your results Make sure you are eating regularly throughout the day to prevent your sugar from dropping low Try and stick w/ a low carb diet to improve the hot flashes Ask GYN if you can get back on the birth control b/c I think that would improve your hot flashes Make sure you are drinking LOTS of water Call with any questions or concerns Hang in there!

## 2022-01-29 NOTE — Assessment & Plan Note (Signed)
New.  BP is elevated again today even though pt is asymptomatic.  I'm not as worried about the elevated readings when pt is having an 'episode' as some of the BP elevation is likely stress related.  But if BP remains elevated at next visit will add Spironolactone to treat not only BP but also hirsutism.  Pt expressed understanding and is in agreement w/ plan.

## 2022-01-29 NOTE — Assessment & Plan Note (Signed)
Deteriorated.  Pt has gained another 5 lbs since October.  Stressed the need for low carb diet- for both weight loss and to improve vasomotor sxs.  Encouraged daily physical activity.  Check labs to risk stratify.  Will follow.

## 2022-01-30 ENCOUNTER — Telehealth: Payer: Managed Care, Other (non HMO) | Admitting: Family Medicine

## 2022-01-31 ENCOUNTER — Other Ambulatory Visit: Payer: Self-pay | Admitting: Registered Nurse

## 2022-01-31 MED ORDER — METHYLPHENIDATE HCL ER (OSM) 18 MG PO TBCR: 18.0000 mg | EXTENDED_RELEASE_TABLET | Freq: Every day | ORAL | 0 refills | Status: DC

## 2022-01-31 NOTE — Telephone Encounter (Signed)
Patient is requesting a refill of the following medications: Requested Prescriptions   Pending Prescriptions Disp Refills   methylphenidate (CONCERTA) 18 MG PO CR tablet 30 tablet 0    Sig: Take 1 tablet (18 mg total) by mouth daily.    Date of patient request: 01/31/2022 Last office visit: 01/29/2022 Date of last refill: 12/17/2021 Last refill amount: 30 Follow up time period per chart: 03/24/2022

## 2022-02-03 ENCOUNTER — Encounter: Payer: Self-pay | Admitting: Family Medicine

## 2022-02-04 ENCOUNTER — Other Ambulatory Visit: Payer: Self-pay | Admitting: Family Medicine

## 2022-02-04 ENCOUNTER — Other Ambulatory Visit (HOSPITAL_COMMUNITY): Payer: Self-pay

## 2022-02-04 MED ORDER — ALBUTEROL SULFATE HFA 108 (90 BASE) MCG/ACT IN AERS
2.0000 | INHALATION_SPRAY | Freq: Four times a day (QID) | RESPIRATORY_TRACT | 0 refills | Status: AC | PRN
  Filled 2022-02-04: qty 6.7, 25d supply, fill #0

## 2022-02-05 ENCOUNTER — Other Ambulatory Visit (HOSPITAL_COMMUNITY): Payer: Self-pay

## 2022-02-05 MED ORDER — SPIRONOLACTONE 25 MG PO TABS
25.0000 mg | ORAL_TABLET | Freq: Every day | ORAL | 3 refills | Status: DC
  Filled 2022-02-05: qty 30, 30d supply, fill #0
  Filled 2022-03-03: qty 30, 30d supply, fill #1
  Filled 2022-03-28: qty 30, 30d supply, fill #2
  Filled 2022-05-02: qty 30, 30d supply, fill #3

## 2022-02-06 NOTE — Telephone Encounter (Signed)
Pt does have appointment scheduled as you had requested

## 2022-02-11 ENCOUNTER — Telehealth: Payer: Self-pay | Admitting: Family Medicine

## 2022-02-11 NOTE — Telephone Encounter (Signed)
Mother called on behalf of patient -   Stated patient rx for methylphenidate was [facility], however [facility] is out of that medication - Mother asking if we can send this over to [facility]-

## 2022-02-12 ENCOUNTER — Other Ambulatory Visit (HOSPITAL_COMMUNITY): Payer: Self-pay

## 2022-02-12 ENCOUNTER — Other Ambulatory Visit: Payer: Self-pay | Admitting: Family Medicine

## 2022-02-12 MED ORDER — METHYLPHENIDATE HCL ER (OSM) 18 MG PO TBCR
18.0000 mg | EXTENDED_RELEASE_TABLET | Freq: Every day | ORAL | 0 refills | Status: DC
  Filled 2022-02-12: qty 30, 30d supply, fill #0

## 2022-02-12 NOTE — Telephone Encounter (Signed)
Please ask pt if she has a different pharmacy as [facility] says medication is on back order.

## 2022-02-12 NOTE — Telephone Encounter (Signed)
Prescription sent to requested pharmacy. 

## 2022-02-13 ENCOUNTER — Encounter: Payer: Self-pay | Admitting: Family Medicine

## 2022-02-13 ENCOUNTER — Other Ambulatory Visit (HOSPITAL_COMMUNITY): Payer: Self-pay

## 2022-02-13 MED ORDER — METHYLPHENIDATE HCL ER (OSM) 18 MG PO TBCR: 18.0000 mg | EXTENDED_RELEASE_TABLET | Freq: Every day | ORAL | 0 refills | Status: DC

## 2022-02-13 NOTE — Telephone Encounter (Signed)
Advised pt that her Rx has been sent to pharmacy

## 2022-03-03 ENCOUNTER — Other Ambulatory Visit: Payer: Self-pay | Admitting: Family Medicine

## 2022-03-03 ENCOUNTER — Other Ambulatory Visit: Payer: Self-pay

## 2022-03-03 ENCOUNTER — Other Ambulatory Visit (HOSPITAL_COMMUNITY): Payer: Self-pay

## 2022-03-03 ENCOUNTER — Encounter: Payer: Self-pay | Admitting: Family Medicine

## 2022-03-03 DIAGNOSIS — F419 Anxiety disorder, unspecified: Secondary | ICD-10-CM

## 2022-03-03 MED ORDER — METHYLPHENIDATE HCL ER (OSM) 18 MG PO TBCR: 18.0000 mg | EXTENDED_RELEASE_TABLET | Freq: Every day | ORAL | 0 refills | Status: DC

## 2022-03-03 MED ORDER — BUPROPION HCL ER (XL) 150 MG PO TB24
150.0000 mg | ORAL_TABLET | Freq: Every day | ORAL | 3 refills | Status: DC
  Filled 2022-03-03: qty 30, 30d supply, fill #0
  Filled 2022-03-28: qty 30, 30d supply, fill #1
  Filled 2022-05-02: qty 30, 30d supply, fill #2

## 2022-03-03 MED ORDER — METHYLPHENIDATE HCL ER (OSM) 18 MG PO TBCR
18.0000 mg | EXTENDED_RELEASE_TABLET | Freq: Every day | ORAL | 0 refills | Status: DC
Start: 2022-03-03 — End: 2022-04-21

## 2022-03-06 ENCOUNTER — Encounter: Payer: Self-pay | Admitting: Family Medicine

## 2022-03-07 ENCOUNTER — Other Ambulatory Visit (HOSPITAL_COMMUNITY): Payer: Self-pay

## 2022-03-24 ENCOUNTER — Ambulatory Visit: Payer: Managed Care, Other (non HMO) | Admitting: Family Medicine

## 2022-03-24 ENCOUNTER — Encounter: Payer: Self-pay | Admitting: Family Medicine

## 2022-03-28 ENCOUNTER — Other Ambulatory Visit: Payer: Self-pay | Admitting: Family Medicine

## 2022-03-28 ENCOUNTER — Other Ambulatory Visit (HOSPITAL_COMMUNITY): Payer: Self-pay

## 2022-03-28 MED ORDER — FLUOXETINE HCL 40 MG PO CAPS
40.0000 mg | ORAL_CAPSULE | Freq: Every day | ORAL | 1 refills | Status: DC
  Filled 2022-03-28: qty 90, 90d supply, fill #0

## 2022-03-28 NOTE — Telephone Encounter (Signed)
Patient is requesting a refill of the following medications: Requested Prescriptions   Pending Prescriptions Disp Refills   FLUoxetine (PROZAC) 40 MG capsule      Sig: Take by mouth.    Date of patient request: 03/28/2022 Last office visit: 01/29/2022 Date of last refill: 12/02/2021 Last refill amount: 90 capsules 1 refill Follow up time period per chart:

## 2022-03-31 ENCOUNTER — Other Ambulatory Visit (HOSPITAL_COMMUNITY): Payer: Self-pay

## 2022-04-01 ENCOUNTER — Other Ambulatory Visit (HOSPITAL_COMMUNITY): Payer: Self-pay

## 2022-04-15 ENCOUNTER — Other Ambulatory Visit (HOSPITAL_COMMUNITY): Payer: Self-pay

## 2022-04-19 ENCOUNTER — Encounter (INDEPENDENT_AMBULATORY_CARE_PROVIDER_SITE_OTHER): Payer: 59 | Admitting: Family Medicine

## 2022-04-19 DIAGNOSIS — F419 Anxiety disorder, unspecified: Secondary | ICD-10-CM

## 2022-04-21 ENCOUNTER — Other Ambulatory Visit (HOSPITAL_COMMUNITY): Payer: Self-pay

## 2022-04-21 MED ORDER — DEXMETHYLPHENIDATE HCL ER 10 MG PO CP24
10.0000 mg | ORAL_CAPSULE | Freq: Every day | ORAL | 0 refills | Status: DC
Start: 2022-04-21 — End: 2022-07-15
  Filled 2022-04-21: qty 30, 30d supply, fill #0

## 2022-04-21 NOTE — Telephone Encounter (Signed)
[facility] VISIT   Patient agreed to [facility] visit and is aware that copayment and coinsurance may apply. Patient was treated using telemedicine according to accepted telemedicine protocols.  Subjective:   Patient complains of needing alternative to Concerta due to inavailability  Patient Active Problem List   Diagnosis Date Noted   Elevated BP without diagnosis of hypertension 01/29/2022   Excessive sweating 01/29/2022   Positive ANA (antinuclear antibody) 03/01/2021   Facial rash 03/01/2021   Anxiety and depression 01/16/2021   Physical exam 03/30/2015   Pain of both breasts 12/22/2014   Severe obesity (BMI >= 40) (HCC) 02/10/2014   PCOS (polycystic ovarian syndrome) 02/10/2014   Social History   Tobacco Use   Smoking status: Never   Smokeless tobacco: Never  Substance Use Topics   Alcohol use: No    Current Outpatient Medications:    dexmethylphenidate (FOCALIN XR) 10 MG 24 hr capsule, Take 1 capsule (10 mg total) by mouth daily., Disp: 30 capsule, Rfl: 0   albuterol (VENTOLIN HFA) 108 (90 Base) MCG/ACT inhaler, INHALE 2 PUFFS BY MOUTH INTO THE LUNGS EVERY 6 HOURS AS NEEDED FOR WHEEZING OR SHORTNESS OF BREATH, Disp: 6.7 g, Rfl: 0   budesonide-formoterol (SYMBICORT) 80-4.5 MCG/ACT inhaler, Inhale 2 puffs into the lungs 2 (two) times daily., Disp: 1 each, Rfl: 5   buPROPion (WELLBUTRIN XL) 150 MG 24 hr tablet, Take 1 tablet  by mouth daily., Disp: 30 tablet, Rfl: 3   FLUoxetine (PROZAC) 40 MG capsule, TAKE 1 CAPSULE BY MOUTH ONCE A DAY, Disp: 90 capsule, Rfl: 1   FLUoxetine (PROZAC) 40 MG capsule, Take 1 capsule (40 mg total) by mouth daily., Disp: 90 capsule, Rfl: 1   norethindrone-ethinyl estradiol-FE (LOESTRIN FE) 1-20 MG-MCG tablet, Take one tablet by mouth daily., Disp: 84 tablet, Rfl: 4   spironolactone (ALDACTONE) 25 MG tablet, Take 1 tablet by mouth daily., Disp: 30 tablet, Rfl: 3   Vitamin D, Ergocalciferol, (DRISDOL) 1.25 MG (50000 UNIT) CAPS capsule, Take 1 capsule  (50,000 Units total) by mouth every 7 (seven) days., Disp: 12 capsule, Rfl: 0  Allergies  Allergen Reactions   Mushroom Extract Complex Hives   Penicillins Hives    Has patient had a PCN reaction causing immediate rash, facial/tongue/throat swelling, SOB or lightheadedness with hypotension: No Has patient had a PCN reaction causing severe rash involving mucus membranes or skin necrosis: No-- pt had hives Has patient had a PCN reaction that required hospitalization: No Has patient had a PCN reaction occurring within the last 10 years: No If all of the above answers are "NO", then may proceed with Cephalosporin use.     Assessment and Plan:   Diagnosis: ADHD. Please see myChart communication and orders below.   No orders of the defined types were placed in this encounter.  Meds ordered this encounter  Medications   dexmethylphenidate (FOCALIN XR) 10 MG 24 hr capsule    Sig: Take 1 capsule (10 mg total) by mouth daily.    Dispense:  30 capsule    Refill:  0    Neena Rhymes, MD 04/21/2022  A total of 6 minutes were spent by me to personally review the patient-generated inquiry, review patient records and data pertinent to assessment of the patient's problem, develop a management plan including generation of prescriptions and/or orders, and on subsequent communication with the patient through secure the MyChart portal service.   There is no separately reported E/M service related to this service in the past 7 days nor does  the patient have an upcoming soonest available appointment for this issue. This work was completed in less than 7 days.   The patient consented to this service today (see patient agreement prior to ongoing communication). Patient counseled regarding the need for in-person exam for certain conditions and was advised to call the office if any changing or worsening symptoms occur.   The codes to be used for the E/M service are: [x]   99421 for 5-10 minutes of time  spent on the inquiry. []   for 11-20 minutes. []   for 21+ minutes.

## 2022-04-22 ENCOUNTER — Other Ambulatory Visit (HOSPITAL_COMMUNITY): Payer: Self-pay

## 2022-04-24 ENCOUNTER — Encounter: Payer: Self-pay | Admitting: Internal Medicine

## 2022-05-02 ENCOUNTER — Other Ambulatory Visit (HOSPITAL_COMMUNITY): Payer: Self-pay

## 2022-05-06 ENCOUNTER — Other Ambulatory Visit (HOSPITAL_COMMUNITY): Payer: Self-pay

## 2022-05-15 ENCOUNTER — Encounter (INDEPENDENT_AMBULATORY_CARE_PROVIDER_SITE_OTHER): Payer: Commercial Managed Care - HMO | Admitting: Family Medicine

## 2022-05-15 DIAGNOSIS — R61 Generalized hyperhidrosis: Secondary | ICD-10-CM | POA: Diagnosis not present

## 2022-05-21 NOTE — Telephone Encounter (Signed)
[facility] VISIT   Patient agreed to [facility] visit and is aware that copayment and coinsurance may apply. Patient was treated using telemedicine according to accepted telemedicine protocols.  Subjective:   Patient complains of excessive sweating  Patient Active Problem List   Diagnosis Date Noted   Elevated BP without diagnosis of hypertension 01/29/2022   Excessive sweating 01/29/2022   Positive ANA (antinuclear antibody) 03/01/2021   Facial rash 03/01/2021   Anxiety and depression 01/16/2021   Physical exam 03/30/2015   Pain of both breasts 12/22/2014   Severe obesity (BMI >= 40) (HCC) 02/10/2014   PCOS (polycystic ovarian syndrome) 02/10/2014   Social History   Tobacco Use   Smoking status: Never   Smokeless tobacco: Never  Substance Use Topics   Alcohol use: No    Current Outpatient Medications:    albuterol (VENTOLIN HFA) 108 (90 Base) MCG/ACT inhaler, INHALE 2 PUFFS BY MOUTH INTO THE LUNGS EVERY 6 HOURS AS NEEDED FOR WHEEZING OR SHORTNESS OF BREATH, Disp: 6.7 g, Rfl: 0   budesonide-formoterol (SYMBICORT) 80-4.5 MCG/ACT inhaler, Inhale 2 puffs into the lungs 2 (two) times daily., Disp: 1 each, Rfl: 5   buPROPion (WELLBUTRIN XL) 150 MG 24 hr tablet, Take 1 tablet  by mouth daily., Disp: 30 tablet, Rfl: 3   dexmethylphenidate (FOCALIN XR) 10 MG 24 hr capsule, Take 1 capsule (10 mg total) by mouth daily., Disp: 30 capsule, Rfl: 0   FLUoxetine (PROZAC) 40 MG capsule, TAKE 1 CAPSULE BY MOUTH ONCE A DAY, Disp: 90 capsule, Rfl: 1   FLUoxetine (PROZAC) 40 MG capsule, Take 1 capsule (40 mg total) by mouth daily., Disp: 90 capsule, Rfl: 1   norethindrone-ethinyl estradiol-FE (LOESTRIN FE) 1-20 MG-MCG tablet, Take one tablet by mouth daily., Disp: 84 tablet, Rfl: 4   spironolactone (ALDACTONE) 25 MG tablet, Take 1 tablet by mouth daily., Disp: 30 tablet, Rfl: 3   Vitamin D, Ergocalciferol, (DRISDOL) 1.25 MG (50000 UNIT) CAPS capsule, Take 1 capsule (50,000 Units total) by mouth every 7  (seven) days., Disp: 12 capsule, Rfl: 0  Allergies  Allergen Reactions   Mushroom Extract Complex Hives   Penicillins Hives    Has patient had a PCN reaction causing immediate rash, facial/tongue/throat swelling, SOB or lightheadedness with hypotension: No Has patient had a PCN reaction causing severe rash involving mucus membranes or skin necrosis: No-- pt had hives Has patient had a PCN reaction that required hospitalization: No Has patient had a PCN reaction occurring within the last 10 years: No If all of the above answers are "NO", then may proceed with Cephalosporin use.     Assessment and Plan:   Diagnosis: excessive sweating. Please see myChart communication and orders below.   Orders Placed This Encounter  Procedures   Ambulatory referral to Endocrinology   No orders of the defined types were placed in this encounter.   Neena Rhymes, MD 05/21/2022  A total of 12 minutes were spent by me to personally review the patient-generated inquiry, review patient records and data pertinent to assessment of the patient's problem, develop a management plan including generation of prescriptions and/or orders, and on subsequent communication with the patient through secure the MyChart portal service.   There is no separately reported E/M service related to this service in the past 7 days nor does the patient have an upcoming soonest available appointment for this issue. This work was completed in less than 7 days.   The patient consented to this service today (see patient agreement prior to  ongoing communication). Patient counseled regarding the need for in-person exam for certain conditions and was advised to call the office if any changing or worsening symptoms occur.   The codes to be used for the E/M service are: []   99421 for 5-10 minutes of time spent on the inquiry. [x]   for 11-20 minutes. []   99423 for 21+ minutes.

## 2022-06-02 NOTE — Assessment & Plan Note (Signed)
Refer back to GYN.  Pt is also due for pap

## 2022-06-02 NOTE — Assessment & Plan Note (Signed)
Pt's PE WNL w/ exception of obesity.  UTD on Tdap.  Flu given today.  Check labs.  Anticipatory guidance provided.  

## 2022-06-02 NOTE — Assessment & Plan Note (Signed)
Ongoing issue for pt.  Check labs to risk stratify.  Will follow.

## 2022-06-03 ENCOUNTER — Ambulatory Visit: Payer: Commercial Managed Care - HMO | Admitting: Internal Medicine

## 2022-06-17 ENCOUNTER — Other Ambulatory Visit (HOSPITAL_BASED_OUTPATIENT_CLINIC_OR_DEPARTMENT_OTHER): Payer: Self-pay

## 2022-07-15 ENCOUNTER — Encounter: Payer: Self-pay | Admitting: Family Medicine

## 2022-07-15 ENCOUNTER — Ambulatory Visit (INDEPENDENT_AMBULATORY_CARE_PROVIDER_SITE_OTHER): Payer: BC Managed Care – PPO | Admitting: Family Medicine

## 2022-07-15 ENCOUNTER — Other Ambulatory Visit (HOSPITAL_COMMUNITY)
Admission: RE | Admit: 2022-07-15 | Discharge: 2022-07-15 | Disposition: A | Discharge status: Home / Self Care | Payer: BC Managed Care – PPO | Source: Ambulatory Visit | Attending: Family Medicine | Admitting: Family Medicine

## 2022-07-15 VITALS — BP 126/78 | HR 79 | Temp 98.4°F | Resp 17 | Ht 64.0 in | Wt 284.4 lb

## 2022-07-15 DIAGNOSIS — R200 Anesthesia of skin: Secondary | ICD-10-CM

## 2022-07-15 DIAGNOSIS — F32A Depression, unspecified: Secondary | ICD-10-CM | POA: Diagnosis not present

## 2022-07-15 DIAGNOSIS — F419 Anxiety disorder, unspecified: Secondary | ICD-10-CM

## 2022-07-15 DIAGNOSIS — Z202 Contact with and (suspected) exposure to infections with a predominantly sexual mode of transmission: Secondary | ICD-10-CM | POA: Insufficient documentation

## 2022-07-15 DIAGNOSIS — R202 Paresthesia of skin: Secondary | ICD-10-CM

## 2022-07-15 MED ORDER — SPIRONOLACTONE 25 MG PO TABS: 25.0000 mg | ORAL_TABLET | Freq: Every day | ORAL | 1 refills | Status: DC

## 2022-07-15 MED ORDER — BUPROPION HCL ER (XL) 150 MG PO TB24: 150.0000 mg | ORAL_TABLET | Freq: Every day | ORAL | 1 refills | Status: DC

## 2022-07-15 MED ORDER — FLUOXETINE HCL 40 MG PO CAPS: 40.0000 mg | ORAL_CAPSULE | Freq: Every day | ORAL | 1 refills | Status: DC

## 2022-07-15 MED ORDER — DEXMETHYLPHENIDATE HCL ER 10 MG PO CP24: 10.0000 mg | ORAL_CAPSULE | Freq: Every day | ORAL | 0 refills | Status: DC

## 2022-07-15 NOTE — Patient Instructions (Signed)
Follow up in 4-6 weeks to recheck mood We'll notify you of your lab results and make any changes if needed RESTART the Fluoxetine, Bupropion, and Focalin TAKE the Spironolactone daily Try and limit large amounts of carbs and sugars at 1 time to avoid the crash a few hours later Try and eat regularly throughout the day Switch to water instead of HiC or juice Call with any questions or concerns Stay Safe!  Stay Healthy!!!

## 2022-07-15 NOTE — Assessment & Plan Note (Signed)
Deteriorated.  Pt stopped her medications ~2 months ago.  Initially she just forgot to take them and then ran out of them entirely.  She initially thought she was doing ok w/o them but then realized she was more irritable, less focused.  Will restart Fluoxetine, Bupropion, and Focalin and monitor for necessary dose adjustments.  Pt expressed understanding and is in agreement w/ plan.

## 2022-07-15 NOTE — Progress Notes (Signed)
   Subjective:    Patient ID: Brittany Lindsey, female    DOB: 1991/04/21, 31 y.o.   MRN: [id number]  HPI Anxiety/Depression- pt reports she got busy with a new job and was not taking medications regularly.  Has been off medications for ~2 months.  Starting to feel the effects- increased irritability.  Medications were working well.  She wants to restart medications but wanted to talk about the best way to do it.  STD exposure- denies current sxs.  No pain, discharge, burning.  L hand numbness- pt reports hand will go numb while driving, overnight.  Sxs will radiate up arm.  L handed.  She is hoping to avoid surgery but knows sxs are worsening and the injxn she had previously did not help.     Review of Systems For ROS see HPI     Objective:   Physical Exam Vitals reviewed.  Constitutional:      General: She is not in acute distress.    Appearance: Normal appearance. She is obese. She is not ill-appearing.  HENT:     Head: Normocephalic and atraumatic.  Eyes:     Extraocular Movements: Extraocular movements intact.     Conjunctiva/sclera: Conjunctivae normal.  Cardiovascular:     Rate and Rhythm: Normal rate and regular rhythm.  Pulmonary:     Effort: Pulmonary effort is normal. No respiratory distress.  Skin:    General: Skin is warm and dry.  Neurological:     General: No focal deficit present.     Mental Status: She is alert and oriented to person, place, and time.  Psychiatric:        Mood and Affect: Mood normal.        Behavior: Behavior normal.        Thought Content: Thought content normal.           Assessment & Plan:   Possible STD exposure- pt is asymptomatic but would like to be tested.  Stressed importance of using condoms.  Numbness/tingling L hand- deteriorated.  Pt reports it will wake her at night.  It impairs her driving (and she is L handed).  Sxs no longer resolve quickly w/ repositioning.  Previously saw a hand specialist and received an injection  but this was not helpful.  She doesn't want to have surgery but she knows something must be done.  Re-refer to hand.

## 2022-07-16 LAB — URINE CYTOLOGY ANCILLARY ONLY
Bacterial Vaginitis-Urine: NEGATIVE
Candida Urine: NEGATIVE
Chlamydia: NEGATIVE
Comment: NEGATIVE
Comment: NEGATIVE
Comment: NORMAL
Neisseria Gonorrhea: NEGATIVE
Trichomonas: NEGATIVE

## 2022-07-16 LAB — RPR: RPR Ser Ql: NONREACTIVE

## 2022-07-16 LAB — HIV ANTIBODY (ROUTINE TESTING W REFLEX): HIV 1&2 Ab, 4th Generation: NONREACTIVE

## 2022-07-17 NOTE — Progress Notes (Signed)
Pt seen results via my chart  

## 2022-07-21 ENCOUNTER — Telehealth: Payer: Self-pay

## 2022-07-21 ENCOUNTER — Other Ambulatory Visit (HOSPITAL_COMMUNITY): Payer: Self-pay

## 2022-07-21 NOTE — Telephone Encounter (Deleted)
Pharmacy Patient Advocate Encounter   Received notification from [facility] that prior authorization for Dexmethylphenidate HCl ER 10MG  er capsules is required/requested.  Per Test Claim: Drug is covered. Eligibility check showed new insurance   PA not needed. Called pharmacy to update insurance.

## 2022-07-21 NOTE — Telephone Encounter (Signed)
Pharmacy Patient Advocate Encounter   Received notification from [facility] that prior authorization for Dexmethylphenidate HCl ER 10MG  er capsules is required/requested.  Per Test Claim: New insurance, Drug is covered   PA not needed. Called pharmacy to update insurance and prescription went through.

## 2022-07-21 NOTE — Telephone Encounter (Signed)
error 

## 2022-08-18 ENCOUNTER — Ambulatory Visit: Payer: BC Managed Care – PPO | Admitting: Family Medicine

## 2022-09-04 ENCOUNTER — Other Ambulatory Visit: Payer: Self-pay

## 2022-10-10 ENCOUNTER — Encounter: Payer: Self-pay | Admitting: Family Medicine

## 2022-10-13 ENCOUNTER — Ambulatory Visit: Payer: BLUE CROSS/BLUE SHIELD | Admitting: Family Medicine

## 2022-11-17 ENCOUNTER — Encounter: Payer: Self-pay | Admitting: Family Medicine

## 2022-11-17 ENCOUNTER — Telehealth: Payer: Self-pay | Admitting: Family Medicine

## 2022-11-17 NOTE — Telephone Encounter (Signed)
Caller name: Junius Finner  On DPR?: Yes  Call back number: [phone]  Provider they see: Midge Minium, MD  Reason for call: Called stating pt is out of network and you can call to solve issue.

## 2022-11-17 NOTE — Telephone Encounter (Signed)
Patient is asking if you spoke with [employer]?

## 2022-12-11 ENCOUNTER — Encounter: Payer: Self-pay | Admitting: Family Medicine

## 2022-12-11 ENCOUNTER — Ambulatory Visit: Payer: Medicaid Other | Admitting: Family Medicine

## 2022-12-11 ENCOUNTER — Other Ambulatory Visit (HOSPITAL_COMMUNITY)
Admission: RE | Admit: 2022-12-11 | Discharge: 2022-12-11 | Disposition: A | Discharge status: Home / Self Care | Payer: Medicaid Other | Source: Ambulatory Visit | Attending: Family Medicine | Admitting: Family Medicine

## 2022-12-11 VITALS — BP 130/80 | HR 75 | Temp 97.8°F | Resp 17 | Ht 64.0 in | Wt 288.2 lb

## 2022-12-11 DIAGNOSIS — E282 Polycystic ovarian syndrome: Secondary | ICD-10-CM

## 2022-12-11 DIAGNOSIS — F419 Anxiety disorder, unspecified: Secondary | ICD-10-CM | POA: Diagnosis not present

## 2022-12-11 DIAGNOSIS — Z202 Contact with and (suspected) exposure to infections with a predominantly sexual mode of transmission: Secondary | ICD-10-CM | POA: Diagnosis present

## 2022-12-11 DIAGNOSIS — F32A Depression, unspecified: Secondary | ICD-10-CM

## 2022-12-11 LAB — LIPID PANEL
Cholesterol: 147 mg/dL (ref 0–200)
HDL: 40.2 mg/dL (ref >39.00)
LDL Cholesterol: 81 mg/dL (ref 0–99)
NonHDL: 106.8
Total CHOL/HDL Ratio: 4
Triglycerides: 129 mg/dL (ref 0.0–149.0)
VLDL: 25.8 mg/dL (ref 0.0–40.0)

## 2022-12-11 LAB — BASIC METABOLIC PANEL
BUN: 16 mg/dL (ref 6–23)
CO2: 25 mEq/L (ref 19–32)
Calcium: 9.1 mg/dL (ref 8.4–10.5)
Chloride: 106 mEq/L (ref 96–112)
Creatinine, Ser: 0.78 mg/dL (ref 0.40–1.20)
GFR: 99.52 mL/min (ref >60.00)
Glucose, Bld: 134 mg/dL — ABNORMAL HIGH (ref 70–99)
Potassium: 3.7 mEq/L (ref 3.5–5.1)
Sodium: 137 mEq/L (ref 135–145)

## 2022-12-11 LAB — CBC WITH DIFFERENTIAL/PLATELET
Basophils Absolute: 0 10*3/uL (ref 0.0–0.1)
Basophils Relative: 0.8 % (ref 0.0–3.0)
Eosinophils Absolute: 0.1 10*3/uL (ref 0.0–0.7)
Eosinophils Relative: 2.1 % (ref 0.0–5.0)
HCT: 40.8 % (ref 36.0–46.0)
Hemoglobin: 13.8 g/dL (ref 12.0–15.0)
Lymphocytes Relative: 32.8 % (ref 12.0–46.0)
Lymphs Abs: 1.9 10*3/uL (ref 0.7–4.0)
MCHC: 33.9 g/dL (ref 30.0–36.0)
MCV: 83.4 fl (ref 78.0–100.0)
Monocytes Absolute: 0.3 10*3/uL (ref 0.1–1.0)
Monocytes Relative: 4.7 % (ref 3.0–12.0)
Neutro Abs: 3.5 10*3/uL (ref 1.4–7.7)
Neutrophils Relative %: 59.6 % (ref 43.0–77.0)
Platelets: 234 10*3/uL (ref 150.0–400.0)
RBC: 4.89 Mil/uL (ref 3.87–5.11)
RDW: 12.6 % (ref 11.5–15.5)
WBC: 5.8 10*3/uL (ref 4.0–10.5)

## 2022-12-11 LAB — HEPATIC FUNCTION PANEL
ALT: 37 U/L — ABNORMAL HIGH (ref 0–35)
AST: 30 U/L (ref 0–37)
Albumin: 4.2 g/dL (ref 3.5–5.2)
Alkaline Phosphatase: 57 U/L (ref 39–117)
Bilirubin, Direct: 0.1 mg/dL (ref 0.0–0.3)
Total Bilirubin: 0.8 mg/dL (ref 0.2–1.2)
Total Protein: 7.3 g/dL (ref 6.0–8.3)

## 2022-12-11 LAB — VITAMIN D 25 HYDROXY (VIT D DEFICIENCY, FRACTURES): VITD: 16.18 ng/mL — ABNORMAL LOW (ref 30.00–100.00)

## 2022-12-11 LAB — TSH: TSH: 2 u[IU]/mL (ref 0.35–5.50)

## 2022-12-11 LAB — HEMOGLOBIN A1C: Hgb A1c MFr Bld: 5.8 % (ref 4.6–6.5)

## 2022-12-11 MED ORDER — BUPROPION HCL ER (XL) 300 MG PO TB24: 300.0000 mg | ORAL_TABLET | Freq: Every day | ORAL | 3 refills | Status: DC

## 2022-12-11 NOTE — Patient Instructions (Signed)
Follow up in 4-6 weeks to recheck mood We'll notify you of your lab results and make any changes if needed We'll call you to schedule your Endocrinology appt for the PCOS INCREASE the Wellbutrin to 300mg  daily- 2 of what you have at home and 1 of the new prescription CONTINUE to work on low carb/low sugar diet and get regular physical activity Call with any questions or concerns Hang in there!!!

## 2022-12-11 NOTE — Assessment & Plan Note (Signed)
Deteriorated.  Pt is in a very difficult position- mom is mentally ill and pt is providing for her both emotionally and financially.  She finds that she is more irritable and has very little emotional reserve to deal w/ things or people.  Will increase Wellbutrin to 300mg  daily in hopes this improves irritability and weight.  Pt expressed understanding and is in agreement w/ plan.

## 2022-12-11 NOTE — Assessment & Plan Note (Signed)
BMI is now 49.48.  Pt reports she is struggling w/ weight despite changing her diet for the better.  Discussed that her PCOS is likely impacting her ability to lose weight.  Rather than starting weight loss medication, I think we need to get her hormone levels stabilized and then pursue weight loss.  Pt expressed understanding and is in agreement w/ plan.

## 2022-12-11 NOTE — Assessment & Plan Note (Signed)
Deteriorated.  Pt reports facial hair is worsening- is now having to get her face waxed.  Also struggling w/ weight gain.  On spironolactone w/o improvement in sxs.  Will refer to Endo for complete evaluation and treatment.

## 2022-12-11 NOTE — Progress Notes (Signed)
   Subjective:    Patient ID: Brittany Lindsey, female    DOB: [birth year], 32 y.o.   MRN: [id number]  HPI Anxiety/Depression- chronic problem, currently on Fluoxetine 40mg  daily and Wellbutrin 150mg  daily.  Increased irritability over the last few months.  Has had episodes of being excessively emotional.  PCOS- pt reports ongoing weight gain despite changing diet.  Worsening facial hair.  Currently on Spironolactone.    STD screening- pt would like routine screen   Review of Systems For ROS see HPI     Objective:   Physical Exam Vitals reviewed.  Constitutional:      General: She is not in acute distress.    Appearance: She is well-developed. She is obese. She is not ill-appearing.  HENT:     Head: Normocephalic and atraumatic.  Eyes:     Conjunctiva/sclera: Conjunctivae normal.     Pupils: Pupils are equal, round, and reactive to light.  Neck:     Thyroid: No thyromegaly.  Cardiovascular:     Rate and Rhythm: Normal rate and regular rhythm.     Pulses: Normal pulses.     Heart sounds: Normal heart sounds. No murmur heard. Pulmonary:     Effort: Pulmonary effort is normal. No respiratory distress.     Breath sounds: Normal breath sounds.  Abdominal:     General: There is no distension.     Palpations: Abdomen is soft.     Tenderness: There is no abdominal tenderness.  Musculoskeletal:     Cervical back: Normal range of motion and neck supple.     Right lower leg: No edema.     Left lower leg: No edema.  Lymphadenopathy:     Cervical: No cervical adenopathy.  Skin:    General: Skin is warm and dry.  Neurological:     General: No focal deficit present.     Mental Status: She is alert and oriented to person, place, and time.  Psychiatric:        Mood and Affect: Mood normal.        Behavior: Behavior normal.        Thought Content: Thought content normal.           Assessment & Plan:

## 2022-12-12 ENCOUNTER — Telehealth: Payer: Self-pay

## 2022-12-12 ENCOUNTER — Other Ambulatory Visit: Payer: Self-pay

## 2022-12-12 DIAGNOSIS — E559 Vitamin D deficiency, unspecified: Secondary | ICD-10-CM

## 2022-12-12 LAB — URINE CYTOLOGY ANCILLARY ONLY
Chlamydia: NEGATIVE
Comment: NEGATIVE
Comment: NEGATIVE
Comment: NORMAL
Neisseria Gonorrhea: NEGATIVE
Trichomonas: NEGATIVE

## 2022-12-12 LAB — RPR: RPR Ser Ql: NONREACTIVE

## 2022-12-12 LAB — HIV ANTIBODY (ROUTINE TESTING W REFLEX): HIV 1&2 Ab, 4th Generation: NONREACTIVE

## 2022-12-12 MED ORDER — VITAMIN D (ERGOCALCIFEROL) 1.25 MG (50000 UNIT) PO CAPS
50000.0000 [IU] | ORAL_CAPSULE | ORAL | 12 refills | Status: DC
Start: 2022-12-12 — End: 2023-06-26

## 2022-12-12 NOTE — Telephone Encounter (Signed)
-----   Message from Sheliah Hatch, MD sent at 12/12/2022  7:22 AM EDT ----- Labs look great w/ exception of low Vit D.  Based on this, we need to start 50,000 units weekly x12 weeks in addition to daily OTC supplement of at least 2000 units.   Sugar is elevated but your A1C (marker of diabetes) is normal- so no concern at this time

## 2022-12-12 NOTE — Telephone Encounter (Signed)
-----   Message from Sheliah Hatch, MD sent at 12/12/2022  3:44 PM EDT ----- Negative HIV- great news!

## 2022-12-12 NOTE — Telephone Encounter (Signed)
Informed pt of lab results and Vit D 50,000 units has been sent in  

## 2022-12-12 NOTE — Telephone Encounter (Signed)
Pt aware of lab result

## 2022-12-15 ENCOUNTER — Telehealth: Payer: Self-pay

## 2022-12-15 NOTE — Telephone Encounter (Signed)
-----   Message from Sheliah Hatch, MD sent at 12/12/2022  4:50 PM EDT ----- No evidence of gonorrhea or chlamydia- great news!

## 2022-12-15 NOTE — Telephone Encounter (Signed)
Pt seen results Via my chart  

## 2022-12-16 ENCOUNTER — Telehealth: Payer: Self-pay

## 2022-12-16 NOTE — Telephone Encounter (Signed)
Pt aware of lab results 

## 2022-12-16 NOTE — Telephone Encounter (Signed)
-----   Message from Sheliah Hatch, MD sent at 12/15/2022  4:56 PM EDT ----- HIV and syphilis are both negative- great news!

## 2023-01-08 ENCOUNTER — Ambulatory Visit (INDEPENDENT_AMBULATORY_CARE_PROVIDER_SITE_OTHER): Payer: Medicaid Other | Admitting: Family Medicine

## 2023-01-08 DIAGNOSIS — Z202 Contact with and (suspected) exposure to infections with a predominantly sexual mode of transmission: Secondary | ICD-10-CM | POA: Diagnosis not present

## 2023-01-08 DIAGNOSIS — Z6841 Body Mass Index (BMI) 40.0 and over, adult: Secondary | ICD-10-CM

## 2023-01-08 DIAGNOSIS — F419 Anxiety disorder, unspecified: Secondary | ICD-10-CM | POA: Diagnosis not present

## 2023-01-08 DIAGNOSIS — F32A Depression, unspecified: Secondary | ICD-10-CM

## 2023-01-08 NOTE — Progress Notes (Signed)
   Subjective:    Patient ID: Brittany Lindsey, female    DOB: [birth year], 32 y.o.   MRN: [id number]  HPI Anxiety/depression- at last visit we increased Wellbutrin to 300mg  daily.  Pt reports feeling better.  Feels that medicine is helping along w/ healthy lifestyle changes.  Obesity- pt is down 4 lbs since last visit.  Walking a mile on treadmill daily.  Has completely changed diet.  STD testing- pt would like repeat testing as it has been 4-6 weeks since exposure   Review of Systems For ROS see HPI     Objective:   Physical Exam Vitals reviewed.  Constitutional:      General: She is not in acute distress.    Appearance: Normal appearance. She is obese. She is not ill-appearing.  HENT:     Head: Normocephalic and atraumatic.  Cardiovascular:     Rate and Rhythm: Normal rate and regular rhythm.     Pulses: Normal pulses.  Pulmonary:     Effort: Pulmonary effort is normal. No respiratory distress.     Breath sounds: Normal breath sounds.  Skin:    General: Skin is warm and dry.  Neurological:     General: No focal deficit present.     Mental Status: She is alert and oriented to person, place, and time.  Psychiatric:        Mood and Affect: Mood normal.        Behavior: Behavior normal.        Thought Content: Thought content normal.           Assessment & Plan:  Possible exposure to STD- it has been 6 weeks since pt was last tested and she wants to be sure she has not developed STD in the interim.  Encouraged condom use w/ each encounter

## 2023-01-08 NOTE — Patient Instructions (Addendum)
Schedule your complete physical in 6 months We'll notify you of your lab results and make any changes if needed Keep up the good work on healthy diet and regular exercise- I'm SO proud of you!! Continue the Wellbutrin 300mg  daily Call with any questions or concerns Happy Early [place] Ouch!!!

## 2023-01-09 LAB — RPR: RPR Ser Ql: NONREACTIVE

## 2023-01-09 LAB — HIV ANTIBODY (ROUTINE TESTING W REFLEX): HIV 1&2 Ab, 4th Generation: NONREACTIVE

## 2023-01-12 ENCOUNTER — Telehealth: Payer: Self-pay

## 2023-01-12 NOTE — Telephone Encounter (Signed)
Pt seen results Via my chart  

## 2023-01-12 NOTE — Telephone Encounter (Signed)
-----   Message from Sheliah Hatch, MD sent at 01/12/2023  7:25 AM EDT ----- Negative for syphilis as well- great news!

## 2023-01-15 ENCOUNTER — Telehealth: Payer: Self-pay

## 2023-01-15 DIAGNOSIS — E282 Polycystic ovarian syndrome: Secondary | ICD-10-CM

## 2023-01-15 NOTE — Telephone Encounter (Signed)
Orders Placed This Encounter  Procedures   Hemoglobin A1c    Standing Status:   Future    Standing Expiration Date:   07/18/2023   Comprehensive metabolic panel    Standing Status:   Future    Standing Expiration Date:   07/18/2023   Lipid panel    Standing Status:   Future    Standing Expiration Date:   07/18/2023   Insulin, random    Standing Status:   Future    Standing Expiration Date:   07/18/2023   TSH    Standing Status:   Future    Standing Expiration Date:   07/18/2023   T4, free    Standing Status:   Future    Standing Expiration Date:   07/18/2023

## 2023-01-19 ENCOUNTER — Other Ambulatory Visit (INDEPENDENT_AMBULATORY_CARE_PROVIDER_SITE_OTHER): Payer: Medicaid Other

## 2023-01-19 ENCOUNTER — Other Ambulatory Visit: Payer: Medicaid Other

## 2023-01-19 DIAGNOSIS — E282 Polycystic ovarian syndrome: Secondary | ICD-10-CM | POA: Diagnosis not present

## 2023-01-19 LAB — LIPID PANEL
Cholesterol: 141 mg/dL (ref 0–200)
HDL: 37.5 mg/dL — ABNORMAL LOW (ref >39.00)
LDL Cholesterol: 73 mg/dL (ref 0–99)
NonHDL: 103.06
Total CHOL/HDL Ratio: 4
Triglycerides: 152 mg/dL — ABNORMAL HIGH (ref 0.0–149.0)
VLDL: 30.4 mg/dL (ref 0.0–40.0)

## 2023-01-19 LAB — COMPREHENSIVE METABOLIC PANEL
ALT: 27 U/L (ref 0–35)
AST: 20 U/L (ref 0–37)
Albumin: 4.1 g/dL (ref 3.5–5.2)
Alkaline Phosphatase: 65 U/L (ref 39–117)
BUN: 11 mg/dL (ref 6–23)
CO2: 24 mEq/L (ref 19–32)
Calcium: 8.9 mg/dL (ref 8.4–10.5)
Chloride: 104 mEq/L (ref 96–112)
Creatinine, Ser: 0.79 mg/dL (ref 0.40–1.20)
GFR: 97.94 mL/min (ref >60.00)
Glucose, Bld: 131 mg/dL — ABNORMAL HIGH (ref 70–99)
Potassium: 3.7 mEq/L (ref 3.5–5.1)
Sodium: 138 mEq/L (ref 135–145)
Total Bilirubin: 0.8 mg/dL (ref 0.2–1.2)
Total Protein: 7.3 g/dL (ref 6.0–8.3)

## 2023-01-19 LAB — HEMOGLOBIN A1C: Hgb A1c MFr Bld: 5.7 % (ref 4.6–6.5)

## 2023-01-19 LAB — TSH: TSH: 2.57 u[IU]/mL (ref 0.35–5.50)

## 2023-01-19 LAB — T4, FREE: Free T4: 0.81 ng/dL (ref 0.60–1.60)

## 2023-01-20 ENCOUNTER — Ambulatory Visit (INDEPENDENT_AMBULATORY_CARE_PROVIDER_SITE_OTHER): Payer: Medicaid Other | Admitting: "Endocrinology

## 2023-01-20 ENCOUNTER — Encounter: Payer: Self-pay | Admitting: "Endocrinology

## 2023-01-20 VITALS — BP 120/80 | HR 72 | Ht 64.0 in | Wt 282.2 lb

## 2023-01-20 DIAGNOSIS — E282 Polycystic ovarian syndrome: Secondary | ICD-10-CM

## 2023-01-20 DIAGNOSIS — E88819 Insulin resistance, unspecified: Secondary | ICD-10-CM

## 2023-01-20 DIAGNOSIS — E782 Mixed hyperlipidemia: Secondary | ICD-10-CM

## 2023-01-20 DIAGNOSIS — Z6841 Body Mass Index (BMI) 40.0 and over, adult: Secondary | ICD-10-CM

## 2023-01-20 LAB — INSULIN, RANDOM: Insulin: 83.1 u[IU]/mL — ABNORMAL HIGH

## 2023-01-20 MED ORDER — SPIRONOLACTONE 50 MG PO TABS: 50.0000 mg | ORAL_TABLET | Freq: Every day | ORAL | 2 refills | Status: DC

## 2023-01-20 MED ORDER — OZEMPIC (0.25 OR 0.5 MG/DOSE) 2 MG/1.5ML ~~LOC~~ SOPN: 0.2500 mg | PEN_INJECTOR | SUBCUTANEOUS | 2 refills | Status: DC

## 2023-01-20 NOTE — Progress Notes (Signed)
Outpatient Endocrinology Note Brittany Premont, MD    Brittany Lindsey 1990/11/23 [id number]  Referring Provider: Sheliah Hatch, MD Primary Care Provider: Sheliah Hatch, MD Reason for consultation: Subjective   Assessment & Plan  Diagnoses and all orders for this visit:  PCOS (polycystic ovarian syndrome)  Insulin resistance  Mixed hypercholesterolemia and hypertriglyceridemia  Class 3 severe obesity due to excess calories with serious comorbidity and body mass index (BMI) of 45.0 to 49.9 in adult [facility])  Other orders -     spironolactone (ALDACTONE) 50 MG tablet; Take 1 tablet (50 mg total) by mouth daily. -     Semaglutide,0.25 or 0.5MG /DOS, (OZEMPIC, 0.25 OR 0.5 MG/DOSE,) 2 MG/1.5ML SOPN; Inject 0.25 mg into the skin once a week.   Diagnosed of PCOS since 2014 Stopped metformin due to various side effects, was on 1000 mg twice daily at 1 point  Stop spironolactone 25 mg due to lack of benefit. Patient had no side effects from it Recommend restarting spironolactone at 50 mg daily  Obesity complicated by insulin resistance as well as hyperlipidemia, current Body mass index is 48.44 kg/m. Pt interested in weight loss Discussed lifestyle changes, medical management as well as bariatric surgery Not interested in weight loss surgery Patient interested in GLP-1, No history of MEN syndrome/medullary thyroid cancer/pancreatitis or pancreatic cancer in self or family Discussed and ordered Ozempic 0.25 mg weekly given the patient has insulin resistance Maintain healthy lifestyle including 1200 Cal/day, 30 min of activity/day, avoiding refined/processed/outside food 20 minutes physical activity per day, in continuum or interruptedly through the day  Goals: less than 60 grams of carbohydrate/meal, 1200-1500 Cal/day, 10,0000 steps a day and weight loss of 0.5-1 lb/ wk    Continue to follow-up with OB/GYN for menstrual cycle and [facility] for fertility  issues  Return in about 6 weeks (around 03/03/2023) for visit .   I have reviewed current medications, nurse's notes, allergies, vital signs, past medical and surgical history, family medical history, and social history for this encounter. Counseled patient on symptoms, examination findings, lab findings, imaging results, treatment decisions and monitoring and prognosis. The patient understood the recommendations and agrees with the treatment plan. All questions regarding treatment plan were fully answered.  Brittany Walton, MD  01/20/23   History of Present Illness HPI  Brittany Lindsey is a 32 y.o. year old female who presents for evaluation of PCOS. Brittany Lindsey was first diagnosed of PCOS in 2014.    Current regimen: none  Patient reports issues with menstrual cycle in th past and  Went to [facility] for ovulation  Was on metformin but reports dizziness sweaty, shaking, was on 1000 mg bid  Started on spironolactone 25 mg by Ob-gyn, pt stopped it due to lack of benefits, no S/E  C/o facial hair and weight gain Gets facial hair waxed    Physical Exam  BP 120/80 (BP Location: Right Arm, Patient Position: Sitting, Cuff Size: Normal)   Pulse 72   Ht 5\' 4"  (1.626 m)   Wt 282 lb 3.2 oz (128 kg)   SpO2 96%   BMI 48.44 kg/m    Constitutional: well developed, well nourished, thick facial hair on upper lips and chin Head: normocephalic, atraumatic Eyes: sclera anicteric, no redness Neck: supple Lungs: normal respiratory effort Neurology: alert and oriented Skin: dry, no appreciable rashes Musculoskeletal: no appreciable defects Psychiatric: normal mood and affect   Current Medications Patient's Medications  New Prescriptions   SEMAGLUTIDE,0.25 OR 0.5MG /DOS, (OZEMPIC, 0.25  OR 0.5 MG/DOSE,) 2 MG/1.5ML SOPN    Inject 0.25 mg into the skin once a week.   SPIRONOLACTONE (ALDACTONE) 50 MG TABLET    Take 1 tablet (50 mg total) by mouth daily.  Previous Medications    ALBUTEROL (VENTOLIN HFA) 108 (90 BASE) MCG/ACT INHALER    INHALE 2 PUFFS BY MOUTH INTO THE LUNGS EVERY 6 HOURS AS NEEDED FOR WHEEZING OR SHORTNESS OF BREATH   BUDESONIDE-FORMOTEROL (SYMBICORT) 80-4.5 MCG/ACT INHALER    Inhale 2 puffs into the lungs 2 (two) times daily.   BUPROPION (WELLBUTRIN XL) 300 MG 24 HR TABLET    Take 1 tablet (300 mg total) by mouth daily.   DEXMETHYLPHENIDATE (FOCALIN XR) 10 MG 24 HR CAPSULE    Take 1 capsule (10 mg total) by mouth daily.   FLUOXETINE (PROZAC) 40 MG CAPSULE    Take 1 capsule (40 mg total) by mouth daily.   VITAMIN D, ERGOCALCIFEROL, (DRISDOL) 1.25 MG (50000 UNIT) CAPS CAPSULE    Take 1 capsule (50,000 Units total) by mouth every 7 (seven) days.  Modified Medications   No medications on file  Discontinued Medications   SPIRONOLACTONE (ALDACTONE) 25 MG TABLET    Take 1 tablet by mouth daily.    Allergies Allergies  Allergen Reactions   Mushroom Extract Complex Hives   Penicillins Hives    Has patient had a PCN reaction causing immediate rash, facial/tongue/throat swelling, SOB or lightheadedness with hypotension: No Has patient had a PCN reaction causing severe rash involving mucus membranes or skin necrosis: No-- pt had hives Has patient had a PCN reaction that required hospitalization: No Has patient had a PCN reaction occurring within the last 10 years: No If all of the above answers are "NO", then may proceed with Cephalosporin use.     Past Medical History Past Medical History:  Diagnosis Date   PCOS (polycystic ovarian syndrome)    Scoliosis    Tubo-ovarian abscess Feb 2016    Past Surgical History Past Surgical History:  Procedure Laterality Date   CHROMOPERTUBATION N/A 11/02/2015   Procedure: CHROMOPERTUBATION;  Surgeon: Silverio Lay, MD;  Location: [facility];  Service: Gynecology;  Laterality: N/A;   LAPAROSCOPY Right 11/02/2015   Procedure: LAPAROSCOPY OPERATIVE with Cure of Hydrosalpynx  with Right Salpingectomy;  Surgeon: Silverio Lay, MD;  Location: [facility];  Service: Gynecology;  Laterality: Right;   LYSIS OF ADHESION N/A 11/02/2015   Procedure: EXTENSIVE LYSIS OF ADHESION;  Surgeon: Silverio Lay, MD;  Location: [facility];  Service: Gynecology;  Laterality: N/A;    Family History family history includes Diabetes in her father and mother; Hyperlipidemia in her mother; Hypertension in her father; Lupus in an other family member; Seizures in her mother; Thyroid disease in her maternal grandmother.  Social History Social History   Socioeconomic History   Marital status: Single    Spouse name: Not on file   Number of children: Not on file   Years of education: Not on file   Highest education level: Associate degree: occupational, Scientist, product/process development, or vocational program  Occupational History   Not on file  Tobacco Use   Smoking status: Never   Smokeless tobacco: Never  Vaping Use   Vaping Use: Never used  Substance and Sexual Activity   Alcohol use: No   Drug use: No   Sexual activity: Yes    Birth control/protection: None  Other Topics Concern   Not on file  Social History Narrative   Not on file   Social  Determinants of Health   Financial Resource Strain: Low Risk  (12/07/2022)   Overall Financial Resource Strain (CARDIA)    Difficulty of Paying Living Expenses: Not hard at all  Food Insecurity: No Food Insecurity (12/07/2022)   Hunger Vital Sign    Worried About Running Out of Food in the Last Year: Never true    Ran Out of Food in the Last Year: Never true  Transportation Needs: No Transportation Needs (12/07/2022)   PRAPARE - Administrator, Civil Service (Medical): No    Lack of Transportation (Non-Medical): No  Physical Activity: Unknown (12/07/2022)   Exercise Vital Sign    Days of Exercise per Week: 0 days    Minutes of Exercise per Session: Not on file  Stress: No Stress Concern Present (12/07/2022)   [employer] of Occupational Health - Occupational Stress Questionnaire     Feeling of Stress : Not at all  Social Connections: Socially Isolated (12/07/2022)   Social Connection and Isolation Panel [NHANES]    Frequency of Communication with Friends and Family: More than three times a week    Frequency of Social Gatherings with Friends and Family: Once a week    Attends Religious Services: Never    Database administrator or Organizations: No    Attends Banker Meetings: Not on file    Marital Status: Never married  Intimate Partner Violence: Not on file    Lab Results  Component Value Date   CHOL 141 01/19/2023   Lab Results  Component Value Date   HDL 37.50 (L) 01/19/2023   Lab Results  Component Value Date   LDLCALC 73 01/19/2023   Lab Results  Component Value Date   TRIG 152.0 (H) 01/19/2023   Lab Results  Component Value Date   CHOLHDL 4 01/19/2023   Lab Results  Component Value Date   CREATININE 0.79 01/19/2023   Lab Results  Component Value Date   GFR 97.94 01/19/2023      Component Value Date/Time   NA 138 01/19/2023 1050   K 3.7 01/19/2023 1050   CL 104 01/19/2023 1050   CO2 24 01/19/2023 1050   GLUCOSE 131 (H) 01/19/2023 1050   BUN 11 01/19/2023 1050   CREATININE 0.79 01/19/2023 1050   CREATININE 0.70 03/30/2015 1441   CALCIUM 8.9 01/19/2023 1050   PROT 7.3 01/19/2023 1050   ALBUMIN 4.1 01/19/2023 1050   AST 20 01/19/2023 1050   ALT 27 01/19/2023 1050   ALKPHOS 65 01/19/2023 1050   BILITOT 0.8 01/19/2023 1050   GFRNONAA >60 11/13/2017 1525   GFRAA >60 11/13/2017 1525      Latest Ref Rng & Units 01/19/2023   10:50 AM 12/11/2022   11:10 AM 01/29/2022   11:12 AM  BMP  Glucose 70 - 99 mg/dL 409  811  914   BUN 6 - 23 mg/dL 11  16  8    Creatinine 0.40 - 1.20 mg/dL 7.82  9.56  2.13   Sodium 135 - 145 mEq/L 138  137  138   Potassium 3.5 - 5.1 mEq/L 3.7  3.7  4.1   Chloride 96 - 112 mEq/L 104  106  102   CO2 19 - 32 mEq/L 24  25  29    Calcium 8.4 - 10.5 mg/dL 8.9  9.1  9.2        Component Value Date/Time    WBC 5.8 12/11/2022 1110   RBC 4.89 12/11/2022 1110   HGB 13.8 12/11/2022  1110   HCT 40.8 12/11/2022 1110   PLT 234.0 12/11/2022 1110   MCV 83.4 12/11/2022 1110   MCH 28.3 11/13/2017 1525   MCHC 33.9 12/11/2022 1110   RDW 12.6 12/11/2022 1110   LYMPHSABS 1.9 12/11/2022 1110   MONOABS 0.3 12/11/2022 1110   EOSABS 0.1 12/11/2022 1110   BASOSABS 0.0 12/11/2022 1110   Lab Results  Component Value Date   TSH 2.57 01/19/2023   TSH 2.00 12/11/2022   TSH 1.57 01/29/2022   FREET4 0.81 01/19/2023   FREET4 0.73 01/29/2022         Parts of this note may have been dictated using voice recognition software. There may be variances in spelling and vocabulary which are unintentional. Not all errors are proofread. Please notify the Thereasa Parkin if any discrepancies are noted or if the meaning of any statement is not clear.

## 2023-02-06 ENCOUNTER — Encounter: Payer: Self-pay | Admitting: Family Medicine

## 2023-02-06 NOTE — Assessment & Plan Note (Signed)
Improved w/ increased dose of Wellbutrin.  Pt is doing well on 300mg  daily.  No med changes at this time.  Will continue to follow.

## 2023-02-06 NOTE — Assessment & Plan Note (Signed)
Ongoing issue for pt.  She is down 4 lbs since last visit.  She is now walking on a treadmill and has completely changed her diet.  Applauded her efforts.  Will follow.

## 2023-02-12 ENCOUNTER — Telehealth: Payer: Self-pay

## 2023-02-12 ENCOUNTER — Other Ambulatory Visit (HOSPITAL_COMMUNITY): Payer: Self-pay

## 2023-02-12 NOTE — Telephone Encounter (Signed)
Patient Advocate Encounter   Received notification from [facility] that prior authorization is required for Ozempic  Submitted: 02/12/23 Key [id number]  Status is pending

## 2023-02-18 NOTE — Telephone Encounter (Signed)
Pharmacy Patient Advocate Encounter  Received notification that the request for prior authorization has been denied      

## 2023-03-03 ENCOUNTER — Ambulatory Visit: Payer: Medicaid Other | Admitting: "Endocrinology

## 2023-03-24 ENCOUNTER — Ambulatory Visit: Payer: 59 | Admitting: "Endocrinology

## 2023-04-06 ENCOUNTER — Ambulatory Visit (INDEPENDENT_AMBULATORY_CARE_PROVIDER_SITE_OTHER): Payer: BLUE CROSS/BLUE SHIELD | Admitting: Family Medicine

## 2023-04-06 ENCOUNTER — Other Ambulatory Visit (HOSPITAL_COMMUNITY)
Admission: RE | Admit: 2023-04-06 | Discharge: 2023-04-06 | Disposition: A | Discharge status: Home / Self Care | Payer: BLUE CROSS/BLUE SHIELD | Source: Ambulatory Visit | Attending: Family Medicine | Admitting: Family Medicine

## 2023-04-06 ENCOUNTER — Encounter: Payer: Self-pay | Admitting: Family Medicine

## 2023-04-06 VITALS — BP 112/60 | HR 85 | Temp 98.3°F | Resp 18 | Ht 64.0 in | Wt 292.4 lb

## 2023-04-06 DIAGNOSIS — Z202 Contact with and (suspected) exposure to infections with a predominantly sexual mode of transmission: Secondary | ICD-10-CM | POA: Insufficient documentation

## 2023-04-06 DIAGNOSIS — F419 Anxiety disorder, unspecified: Secondary | ICD-10-CM

## 2023-04-06 DIAGNOSIS — Z6841 Body Mass Index (BMI) 40.0 and over, adult: Secondary | ICD-10-CM

## 2023-04-06 DIAGNOSIS — F32A Depression, unspecified: Secondary | ICD-10-CM | POA: Diagnosis not present

## 2023-04-06 LAB — BASIC METABOLIC PANEL
BUN: 10 mg/dL (ref 6–23)
CO2: 25 mEq/L (ref 19–32)
Calcium: 9 mg/dL (ref 8.4–10.5)
Chloride: 103 mEq/L (ref 96–112)
Creatinine, Ser: 0.78 mg/dL (ref 0.40–1.20)
GFR: 99.3 mL/min (ref >60.00)
Glucose, Bld: 199 mg/dL — ABNORMAL HIGH (ref 70–99)
Potassium: 3.3 mEq/L — ABNORMAL LOW (ref 3.5–5.1)
Sodium: 137 mEq/L (ref 135–145)

## 2023-04-06 LAB — HEMOGLOBIN A1C: Hgb A1c MFr Bld: 6 % (ref 4.6–6.5)

## 2023-04-06 LAB — TSH: TSH: 2.53 u[IU]/mL (ref 0.35–5.50)

## 2023-04-06 NOTE — Progress Notes (Signed)
   Subjective:    Patient ID: Brittany Lindsey, female    DOB: 1991-08-25, 32 y.o.   MRN: [id number]  HPI STD exposure- pt remains asymptomatic.  No itching, burning, pain.  Pt would just like repeat HIV as this will be a 6 week repeat test.  Depression- ongoing issue for pt.  Wellbutrin was increased to 300mg  daily at last visit but she doesn't feel this is working.  Pt feels that Wellbutrin will work for the first few weeks/months and then tail off.  She feels that she has been 'pretty much ok'.  Not interested in changing meds at this time.  Obesity- pt has gained 8 lbs since May.  Endo recommended a 1200 calorie diet but pt was not sure how to proceed.  Pt reports she is starting to 'feel' her weight.  Is uncomfortable w/ certain movements.     Review of Systems For ROS see HPI     Objective:   Physical Exam Vitals reviewed.  Constitutional:      General: She is not in acute distress.    Appearance: Normal appearance. She is well-developed. She is obese. She is not ill-appearing.  HENT:     Head: Normocephalic and atraumatic.  Eyes:     Conjunctiva/sclera: Conjunctivae normal.     Pupils: Pupils are equal, round, and reactive to light.  Neck:     Thyroid: No thyromegaly.  Cardiovascular:     Rate and Rhythm: Normal rate and regular rhythm.     Heart sounds: Normal heart sounds. No murmur heard. Pulmonary:     Effort: Pulmonary effort is normal. No respiratory distress.     Breath sounds: Normal breath sounds.  Abdominal:     General: There is no distension.     Palpations: Abdomen is soft.     Tenderness: There is no abdominal tenderness.  Musculoskeletal:     Cervical back: Normal range of motion and neck supple.  Lymphadenopathy:     Cervical: No cervical adenopathy.  Skin:    General: Skin is warm and dry.  Neurological:     General: No focal deficit present.     Mental Status: She is alert and oriented to person, place, and time.  Psychiatric:        Mood and  Affect: Mood normal.        Behavior: Behavior normal.        Thought Content: Thought content normal.           Assessment & Plan:  STD exposure- pt would like 6 week confirmatory HIV testing and repeat GC/CT testing.  Encouraged condoms at every sexual encounter.

## 2023-04-06 NOTE — Assessment & Plan Note (Signed)
Deteriorated.  Pt has gained 8 lbs since May.  Is very frustrated and states 'something has to be done'.  Will refer to Nutrition and will also try and prescribe Wegovy after 8/1 as it is supposed to be on Medicaid formulary at that time.  Pt expressed understanding and is in agreement w/ plan.

## 2023-04-06 NOTE — Patient Instructions (Signed)
Follow up in 3 months to recheck weight We'll notify you of your lab results and make any changes if needed We'll call you to schedule a nutrition appt Continue to eat a low carb/low sugar diet and try and get regular physical activity We'll try and get Wegovy approved after 8/1 Call with any questions or concerns Hang in there!

## 2023-04-06 NOTE — Assessment & Plan Note (Signed)
Ongoing issue.  Pt doesn't feel Wellbutrin is working as well as it had been but at this time, feels that things are ok and stable enough that meds do not need to be changed.  Will continue to monitor and adjust meds prn.

## 2023-04-07 ENCOUNTER — Telehealth: Payer: Self-pay

## 2023-04-07 NOTE — Telephone Encounter (Signed)
-----   Message from Neena Rhymes sent at 04/07/2023 11:24 AM EDT ----- No evidence of gonorrhea or chlamydia- great news!  Still waiting on HIV  Potassium is slightly low.  Please increase your leafy greens, citrus fruits, bananas, etc.  Remainder of labs look great!

## 2023-04-08 ENCOUNTER — Ambulatory Visit: Payer: Medicaid Other | Admitting: Family Medicine

## 2023-04-08 ENCOUNTER — Telehealth: Payer: Self-pay

## 2023-04-08 NOTE — Telephone Encounter (Signed)
-----   Message from Neena Rhymes sent at 04/08/2023  7:31 AM EDT ----- Negative HIV.  Great news!

## 2023-04-08 NOTE — Telephone Encounter (Signed)
Pt seen results Via my chart  

## 2023-04-14 ENCOUNTER — Other Ambulatory Visit (HOSPITAL_COMMUNITY): Payer: Self-pay

## 2023-04-14 ENCOUNTER — Telehealth: Payer: Self-pay

## 2023-04-14 MED ORDER — WEGOVY 0.25 MG/0.5ML ~~LOC~~ SOAJ
0.2500 mg | SUBCUTANEOUS | 1 refills | Status: DC
  Filled 2023-04-14: qty 2, 28d supply, fill #0

## 2023-04-14 NOTE — Telephone Encounter (Signed)
Pt called and stated that at her last visit Dr Beverely Low and she discussed ordering [facility] after Aug 1,2024. I do not see the medication in list .it does state in last OV note that we can try to order the [facility] after Aug 1 due to it should be on the [facility] formulary . Can we order for pt ?

## 2023-04-14 NOTE — Telephone Encounter (Signed)
Prescription sent for starting dose of Wegovy.  Will keep our fingers crossed that insurance will cover

## 2023-04-21 ENCOUNTER — Telehealth: Payer: Self-pay

## 2023-04-21 NOTE — Telephone Encounter (Signed)
*  Primary  Pharmacy Patient Advocate Encounter   Received notification from CoverMyMeds that prior authorization for [facility] 0.25MG /0.5ML auto-injectors  is required/requested.   Insurance verification completed.   The patient is insured through [facility] .   Per test claim: PA required; PA submitted to [facility] via CoverMyMeds Key/confirmation #/[id number] Status is pending

## 2023-04-22 ENCOUNTER — Telehealth: Payer: Self-pay

## 2023-04-22 NOTE — Telephone Encounter (Signed)
Pharmacy Patient Advocate Encounter  Received notification from [facility] that Prior Authorization for [facility] 0.25MG /0.5ML auto-injectors has been DENIED. Please advise how you'd like to proceed. Full denial letter will be uploaded to the media tab. See denial reason below.   PA #/Case ID/Reference #: [id number]   Denial Reason:  This health benefit plan does not cover the following services, supplies, drugs or charges: Any treatment or regimen, medical or surgical, for the purpose of reducing or controlling the weight of the member, or for the treatment of obesity, except for surgical treatment of morbid obesity, or as specifically covered by this health benefit plan.

## 2023-04-22 NOTE — Telephone Encounter (Signed)
Insurance denied PA for [employer] . Would you like to try something else ?

## 2023-04-22 NOTE — Telephone Encounter (Signed)
PA request has been Denied. New Encounter created for follow up. For additional info see Pharmacy Prior Auth telephone encounter from 04/21/23.

## 2023-04-22 NOTE — Telephone Encounter (Signed)
Good morning , Pharmacy needs a PA on pt Rx Wegovy dx is obesity

## 2023-04-22 NOTE — Telephone Encounter (Signed)
I have notified the pt . The  paper work they sent stated it is no on their formulary

## 2023-04-22 NOTE — Telephone Encounter (Signed)
I have sent the pt a my chart message it has been denied as well as DR Milus Mallick

## 2023-04-22 NOTE — Telephone Encounter (Signed)
It was my understanding that Medicaid would begin covering [facility] on 8/1.  She needs to call her insurance and see if they are currently covering any weight loss medication

## 2023-04-29 ENCOUNTER — Ambulatory Visit: Payer: Medicaid Other | Admitting: "Endocrinology

## 2023-05-17 ENCOUNTER — Encounter: Payer: Self-pay | Admitting: "Endocrinology

## 2023-05-17 ENCOUNTER — Encounter (INDEPENDENT_AMBULATORY_CARE_PROVIDER_SITE_OTHER): Payer: BLUE CROSS/BLUE SHIELD | Admitting: Family Medicine

## 2023-05-17 DIAGNOSIS — R21 Rash and other nonspecific skin eruption: Secondary | ICD-10-CM

## 2023-05-18 DIAGNOSIS — R21 Rash and other nonspecific skin eruption: Secondary | ICD-10-CM | POA: Diagnosis not present

## 2023-05-20 ENCOUNTER — Other Ambulatory Visit (HOSPITAL_COMMUNITY): Payer: Self-pay

## 2023-05-20 MED ORDER — METRONIDAZOLE 1 % EX GEL: Freq: Every day | CUTANEOUS | 1 refills | Status: AC

## 2023-05-20 MED ORDER — METRONIDAZOLE 1 % EX GEL
Freq: Every day | CUTANEOUS | 1 refills | Status: DC
  Filled 2023-05-20: qty 60, 30d supply, fill #0

## 2023-05-20 NOTE — Addendum Note (Signed)
Addended by: Eldred Manges on: 05/20/2023 04:03 PM   Modules accepted: Orders

## 2023-05-20 NOTE — Telephone Encounter (Signed)
[facility] VISIT   Patient agreed to [facility] visit and is aware that copayment and coinsurance may apply. Patient was treated using telemedicine according to accepted telemedicine protocols.  Subjective:   Patient complains of facial rash  Patient Active Problem List   Diagnosis Date Noted   Numbness and tingling in left hand 07/15/2022   Elevated BP without diagnosis of hypertension 01/29/2022   Excessive sweating 01/29/2022   Positive ANA (antinuclear antibody) 03/01/2021   Facial rash 03/01/2021   Anxiety and depression 01/16/2021   Physical exam 03/30/2015   Pain of both breasts 12/22/2014   Severe obesity (BMI >= 40) (HCC) 02/10/2014   PCOS (polycystic ovarian syndrome) 02/10/2014   Social History   Tobacco Use   Smoking status: Never   Smokeless tobacco: Never  Substance Use Topics   Alcohol use: No    Current Outpatient Medications:    metroNIDAZOLE (METROGEL) 1 % gel, Apply topically daily., Disp: 60 g, Rfl: 1   albuterol (VENTOLIN HFA) 108 (90 Base) MCG/ACT inhaler, INHALE 2 PUFFS BY MOUTH INTO THE LUNGS EVERY 6 HOURS AS NEEDED FOR WHEEZING OR SHORTNESS OF BREATH, Disp: 6.7 g, Rfl: 0   budesonide-formoterol (SYMBICORT) 80-4.5 MCG/ACT inhaler, Inhale 2 puffs into the lungs 2 (two) times daily., Disp: 1 each, Rfl: 5   buPROPion (WELLBUTRIN XL) 300 MG 24 hr tablet, Take 1 tablet (300 mg total) by mouth daily., Disp: 30 tablet, Rfl: 3   Semaglutide-Weight Management (WEGOVY) 0.25 MG/0.5ML SOAJ, Inject 0.25 mg into the skin once a week., Disp: 2 mL, Rfl: 1   spironolactone (ALDACTONE) 50 MG tablet, Take 1 tablet (50 mg total) by mouth daily., Disp: 30 tablet, Rfl: 2   Vitamin D, Ergocalciferol, (DRISDOL) 1.25 MG (50000 UNIT) CAPS capsule, Take 1 capsule (50,000 Units total) by mouth every 7 (seven) days. (Patient not taking: Reported on 04/06/2023), Disp: 7 capsule, Rfl: 12  Allergies  Allergen Reactions   Mushroom Extract Complex Hives   Penicillins Hives    Has patient  had a PCN reaction causing immediate rash, facial/tongue/throat swelling, SOB or lightheadedness with hypotension: No Has patient had a PCN reaction causing severe rash involving mucus membranes or skin necrosis: No-- pt had hives Has patient had a PCN reaction that required hospitalization: No Has patient had a PCN reaction occurring within the last 10 years: No If all of the above answers are "NO", then may proceed with Cephalosporin use.     Assessment and Plan:   Diagnosis: Facial rash. Please see myChart communication and orders below.   Orders Placed This Encounter  Procedures   Ambulatory referral to Dermatology   Meds ordered this encounter  Medications   metroNIDAZOLE (METROGEL) 1 % gel    Sig: Apply topically daily.    Dispense:  60 g    Refill:  1    Neena Rhymes, MD 05/20/2023  A total of 9 minutes were spent by me to personally review the patient-generated inquiry, review patient records and data pertinent to assessment of the patient's problem, develop a management plan including generation of prescriptions and/or orders, and on subsequent communication with the patient through secure the MyChart portal service.   There is no separately reported E/M service related to this service in the past 7 days nor does the patient have an upcoming soonest available appointment for this issue. This work was completed in less than 7 days.   The patient consented to this service today (see patient agreement prior to ongoing communication). Patient counseled regarding  the need for in-person exam for certain conditions and was advised to call the office if any changing or worsening symptoms occur.   The codes to be used for the E/M service are: [x]   99421 for 5-10 minutes of time spent on the inquiry. []   [id number] for 11-20 minutes. []   99423 for 21+ minutes.

## 2023-05-20 NOTE — Addendum Note (Signed)
Addended by: Sheliah Hatch on: 05/20/2023 03:40 PM   Modules accepted: Orders

## 2023-05-21 ENCOUNTER — Other Ambulatory Visit (HOSPITAL_COMMUNITY): Payer: Self-pay

## 2023-05-22 ENCOUNTER — Other Ambulatory Visit (HOSPITAL_COMMUNITY): Payer: Self-pay

## 2023-06-02 ENCOUNTER — Encounter: Payer: Self-pay | Admitting: Family Medicine

## 2023-06-02 DIAGNOSIS — F419 Anxiety disorder, unspecified: Secondary | ICD-10-CM

## 2023-06-03 MED ORDER — METFORMIN HCL ER 500 MG PO TB24: 500.0000 mg | ORAL_TABLET | Freq: Every day | ORAL | 1 refills | Status: DC

## 2023-06-03 MED ORDER — BUPROPION HCL ER (XL) 300 MG PO TB24: 300.0000 mg | ORAL_TABLET | Freq: Every day | ORAL | 3 refills | Status: DC

## 2023-06-03 NOTE — Telephone Encounter (Signed)
LMx1 for patient to call office to schedule follow up.

## 2023-06-03 NOTE — Addendum Note (Signed)
Addended by: Sheliah Hatch on: 06/03/2023 02:03 PM   Modules accepted: Orders

## 2023-06-06 ENCOUNTER — Other Ambulatory Visit (HOSPITAL_COMMUNITY): Payer: Self-pay

## 2023-06-08 ENCOUNTER — Other Ambulatory Visit (HOSPITAL_COMMUNITY): Payer: Self-pay

## 2023-06-16 ENCOUNTER — Encounter: Payer: Self-pay | Admitting: "Endocrinology

## 2023-06-16 ENCOUNTER — Ambulatory Visit (INDEPENDENT_AMBULATORY_CARE_PROVIDER_SITE_OTHER): Payer: BLUE CROSS/BLUE SHIELD | Admitting: "Endocrinology

## 2023-06-16 VITALS — BP 112/68 | HR 92 | Resp 20 | Ht 64.0 in | Wt 284.8 lb

## 2023-06-16 DIAGNOSIS — E88819 Insulin resistance, unspecified: Secondary | ICD-10-CM | POA: Diagnosis not present

## 2023-06-16 DIAGNOSIS — E782 Mixed hyperlipidemia: Secondary | ICD-10-CM

## 2023-06-16 DIAGNOSIS — R7303 Prediabetes: Secondary | ICD-10-CM

## 2023-06-16 DIAGNOSIS — E282 Polycystic ovarian syndrome: Secondary | ICD-10-CM | POA: Diagnosis not present

## 2023-06-16 NOTE — Progress Notes (Signed)
Outpatient Endocrinology Note [place], MD    Brittany Lindsey 09-23-1990 [id number]  Referring Provider: Sheliah Hatch, MD Primary Care Provider: Sheliah Hatch, MD Reason for consultation: Subjective   Assessment & Plan  Diagnoses and all orders for this visit:  PCOS (polycystic ovarian syndrome)  Insulin resistance  Pre-diabetes  Mixed hypercholesterolemia and hypertriglyceridemia  Morbid obesity (HCC)    Diagnosed of PCOS since 2014 Stopped metformin due to various side effects, was on 1000 mg twice daily at 1 point On metformin XR 500 mg once a day, recommend to increase dose by 1 pill weekly until on full dose of 2000 mg per day   On spironolactone at 50 mg daily Patient reports it is working well for her, no known S/E   Obesity complicated by insulin resistance, new pre-diabetes as well as hyperlipidemia, current Body mass index is 48.89 kg/m. Lost 12 lbs on Wellbutrin XL since 05/2023, starting weight 296 lbs Open to GLP1 if covered by insurance, pt will find out; ozempic denied, wegovy by pcp pending approval  Phentermine remains an option, no known contraindications Pt has an appointment with nutritionist No plan for weight loss surgery at this point Discussed lifestyle changes, medical management as well as bariatric surgery Patient interested in GLP-1, No history of MEN syndrome/medullary thyroid cancer/pancreatitis or pancreatic cancer in self or family Maintain healthy lifestyle including 1200 Cal/day, 30 min of activity/day, avoiding refined/processed/outside food 20 minutes physical activity per day, in continuum or interruptedly through the day  Goals: less than 60 grams of carbohydrate/meal, 1200-1500 Cal/day, 10,0000 steps a day and weight loss of 0.5-1 lb/ wk    Continue to follow-up with OB/GYN for menstrual cycle and [facility] for fertility issues  Return in about 2 months (around 08/16/2023).   I have reviewed  current medications, nurse's notes, allergies, vital signs, past medical and surgical history, family medical history, and social history for this encounter. Counseled patient on symptoms, examination findings, lab findings, imaging results, treatment decisions and monitoring and prognosis. The patient understood the recommendations and agrees with the treatment plan. All questions regarding treatment plan were fully answered.  Brittany [place], MD  06/16/23   History of Present Illness HPI  Brittany Lindsey is a 32 y.o. year old female who presents for evaluation of PCOS. Brittany Lindsey was first diagnosed of PCOS in 2014.    Current regimen: Wellbutrin XL 300 mg by pcp-since 05/2023 Spironolactone 50 mg every day Metformin XR 500 gm every day   Patient reports issues with menstrual cycle in th past and  Went to [facility] for ovulation  Was on metformin but reports dizziness sweaty, shaking, was on 1000 mg bid  Started on spironolactone 25 mg by Ob-gyn  Gets facial hair waxed    Physical Exam  BP 112/68 (BP Location: Left Arm, Patient Position: Sitting, Cuff Size: Large)   Pulse 92   Resp 20   Ht 5\' 4"  (1.626 m)   Wt 284 lb 12.8 oz (129.2 kg)   SpO2 98%   BMI 48.89 kg/m    Constitutional: well developed, well nourished, thick facial hair on upper lips and chin Head: normocephalic, atraumatic Eyes: sclera anicteric, no redness Neck: supple Lungs: normal respiratory effort Neurology: alert and oriented Skin: dry, no appreciable rashes Musculoskeletal: no appreciable defects Psychiatric: normal mood and affect   Current Medications Patient's Medications  New Prescriptions   No medications on file  Previous Medications   ALBUTEROL (VENTOLIN HFA) 108 (90 BASE)  MCG/ACT INHALER    INHALE 2 PUFFS BY MOUTH INTO THE LUNGS EVERY 6 HOURS AS NEEDED FOR WHEEZING OR SHORTNESS OF BREATH   BUDESONIDE-FORMOTEROL (SYMBICORT) 80-4.5 MCG/ACT INHALER    Inhale 2 puffs into the  lungs 2 (two) times daily.   BUPROPION (WELLBUTRIN XL) 300 MG 24 HR TABLET    Take 1 tablet (300 mg total) by mouth daily.   METFORMIN (GLUCOPHAGE-XR) 500 MG 24 HR TABLET    Take 1 tablet (500 mg total) by mouth daily with breakfast.   METRONIDAZOLE (METROGEL) 1 % GEL    Apply topically daily.   SEMAGLUTIDE-WEIGHT MANAGEMENT (WEGOVY) 0.25 MG/0.5ML SOAJ    Inject 0.25 mg into the skin once a week.   SPIRONOLACTONE (ALDACTONE) 50 MG TABLET    Take 1 tablet (50 mg total) by mouth daily.   VITAMIN D, ERGOCALCIFEROL, (DRISDOL) 1.25 MG (50000 UNIT) CAPS CAPSULE    Take 1 capsule (50,000 Units total) by mouth every 7 (seven) days.  Modified Medications   No medications on file  Discontinued Medications   No medications on file    Allergies Allergies  Allergen Reactions   Mushroom Extract Complex Hives   Penicillins Hives    Has patient had a PCN reaction causing immediate rash, facial/tongue/throat swelling, SOB or lightheadedness with hypotension: No Has patient had a PCN reaction causing severe rash involving mucus membranes or skin necrosis: No-- pt had hives Has patient had a PCN reaction that required hospitalization: No Has patient had a PCN reaction occurring within the last 10 years: No If all of the above answers are "NO", then may proceed with Cephalosporin use.     Past Medical History Past Medical History:  Diagnosis Date   PCOS (polycystic ovarian syndrome)    Scoliosis    Tubo-ovarian abscess Feb 2016    Past Surgical History Past Surgical History:  Procedure Laterality Date   CHROMOPERTUBATION N/A 11/02/2015   Procedure: CHROMOPERTUBATION;  Surgeon: Silverio Lay, MD;  Location: [facility];  Service: Gynecology;  Laterality: N/A;   LAPAROSCOPY Right 11/02/2015   Procedure: LAPAROSCOPY OPERATIVE with Cure of Hydrosalpynx  with Right Salpingectomy;  Surgeon: Silverio Lay, MD;  Location: [facility];  Service: Gynecology;  Laterality: Right;   LYSIS OF ADHESION N/A 11/02/2015    Procedure: EXTENSIVE LYSIS OF ADHESION;  Surgeon: Silverio Lay, MD;  Location: [facility];  Service: Gynecology;  Laterality: N/A;    Family History family history includes Diabetes in her father and mother; Hyperlipidemia in her mother; Hypertension in her father; Lupus in an other family member; Seizures in her mother; Thyroid disease in her maternal grandmother.  Social History Social History   Socioeconomic History   Marital status: Single    Spouse name: Not on file   Number of children: Not on file   Years of education: Not on file   Highest education level: Associate degree: occupational, Scientist, product/process development, or vocational program  Occupational History   Not on file  Tobacco Use   Smoking status: Never   Smokeless tobacco: Never  Vaping Use   Vaping status: Never Used  Substance and Sexual Activity   Alcohol use: No   Drug use: No   Sexual activity: Yes    Birth control/protection: None  Other Topics Concern   Not on file  Social History Narrative   Not on file   Social Determinants of Health   Financial Resource Strain: Low Risk  (12/07/2022)   Overall Financial Resource Strain (CARDIA)    Difficulty of  Paying Living Expenses: Not hard at all  Food Insecurity: No Food Insecurity (12/07/2022)   Hunger Vital Sign    Worried About Running Out of Food in the Last Year: Never true    Ran Out of Food in the Last Year: Never true  Transportation Needs: No Transportation Needs (12/07/2022)   PRAPARE - Administrator, Civil Service (Medical): No    Lack of Transportation (Non-Medical): No  Physical Activity: Unknown (12/07/2022)   Exercise Vital Sign    Days of Exercise per Week: 0 days    Minutes of Exercise per Session: Not on file  Stress: No Stress Concern Present (12/07/2022)   [employer] of Occupational Health - Occupational Stress Questionnaire    Feeling of Stress : Not at all  Social Connections: Unknown (03/07/2023)   Received from [facility]   Social  Network    Social Network: Not on file  Recent Concern: Social Connections - Socially Isolated (12/07/2022)   Social Connection and Isolation Panel [NHANES]    Frequency of Communication with Friends and Family: More than three times a week    Frequency of Social Gatherings with Friends and Family: Once a week    Attends Religious Services: Never    Database administrator or Organizations: No    Attends Engineer, structural: Not on file    Marital Status: Never married  Intimate Partner Violence: Unknown (03/07/2023)   Received from [facility]   HITS    Physically Hurt: Not on file    Insult or Talk Down To: Not on file    Threaten Physical Harm: Not on file    Scream or Curse: Not on file    Lab Results  Component Value Date   CHOL 141 01/19/2023   Lab Results  Component Value Date   HDL 37.50 (L) 01/19/2023   Lab Results  Component Value Date   LDLCALC 73 01/19/2023   Lab Results  Component Value Date   TRIG 152.0 (H) 01/19/2023   Lab Results  Component Value Date   CHOLHDL 4 01/19/2023   Lab Results  Component Value Date   CREATININE 0.78 04/06/2023   Lab Results  Component Value Date   GFR 99.30 04/06/2023      Component Value Date/Time   NA 137 04/06/2023 0951   K 3.3 (L) 04/06/2023 0951   CL 103 04/06/2023 0951   CO2 25 04/06/2023 0951   GLUCOSE 199 (H) 04/06/2023 0951   BUN 10 04/06/2023 0951   CREATININE 0.78 04/06/2023 0951   CREATININE 0.70 03/30/2015 1441   CALCIUM 9.0 04/06/2023 0951   PROT 7.3 01/19/2023 1050   ALBUMIN 4.1 01/19/2023 1050   AST 20 01/19/2023 1050   ALT 27 01/19/2023 1050   ALKPHOS 65 01/19/2023 1050   BILITOT 0.8 01/19/2023 1050   GFRNONAA >60 11/13/2017 1525   GFRAA >60 11/13/2017 1525      Latest Ref Rng & Units 04/06/2023    9:51 AM 01/19/2023   10:50 AM 12/11/2022   11:10 AM  BMP  Glucose 70 - 99 mg/dL 161  096  045   BUN 6 - 23 mg/dL 10  11  16    Creatinine 0.40 - 1.20 mg/dL 4.09  8.11  9.14   Sodium  135 - 145 mEq/L 137  138  137   Potassium 3.5 - 5.1 mEq/L 3.3  3.7  3.7   Chloride 96 - 112 mEq/L 103  104  106  CO2 19 - 32 mEq/L 25  24  25    Calcium 8.4 - 10.5 mg/dL 9.0  8.9  9.1        Component Value Date/Time   WBC 5.8 12/11/2022 1110   RBC 4.89 12/11/2022 1110   HGB 13.8 12/11/2022 1110   HCT 40.8 12/11/2022 1110   PLT 234.0 12/11/2022 1110   MCV 83.4 12/11/2022 1110   MCH 28.3 11/13/2017 1525   MCHC 33.9 12/11/2022 1110   RDW 12.6 12/11/2022 1110   LYMPHSABS 1.9 12/11/2022 1110   MONOABS 0.3 12/11/2022 1110   EOSABS 0.1 12/11/2022 1110   BASOSABS 0.0 12/11/2022 1110   Lab Results  Component Value Date   TSH 2.53 04/06/2023   TSH 2.57 01/19/2023   TSH 2.00 12/11/2022   FREET4 0.81 01/19/2023   FREET4 0.73 01/29/2022         Parts of this note may have been dictated using voice recognition software. There may be variances in spelling and vocabulary which are unintentional. Not all errors are proofread. Please notify the Thereasa Parkin if any discrepancies are noted or if the meaning of any statement is not clear.

## 2023-06-17 ENCOUNTER — Other Ambulatory Visit (HOSPITAL_COMMUNITY): Payer: Self-pay

## 2023-06-17 NOTE — Addendum Note (Signed)
Addended by: [name] on: 06/17/2023 01:44 PM   Modules accepted: Orders

## 2023-06-23 ENCOUNTER — Encounter: Payer: Self-pay | Admitting: Family Medicine

## 2023-06-29 ENCOUNTER — Ambulatory Visit (INDEPENDENT_AMBULATORY_CARE_PROVIDER_SITE_OTHER): Payer: BLUE CROSS/BLUE SHIELD | Admitting: Family Medicine

## 2023-06-29 VITALS — BP 132/72 | HR 78 | Temp 97.9°F | Ht 64.0 in | Wt 286.1 lb

## 2023-06-29 DIAGNOSIS — R41840 Attention and concentration deficit: Secondary | ICD-10-CM | POA: Diagnosis not present

## 2023-06-29 DIAGNOSIS — R0602 Shortness of breath: Secondary | ICD-10-CM | POA: Diagnosis not present

## 2023-06-29 MED ORDER — BUDESONIDE-FORMOTEROL FUMARATE 80-4.5 MCG/ACT IN AERO: 2.0000 | INHALATION_SPRAY | Freq: Two times a day (BID) | RESPIRATORY_TRACT | 5 refills | Status: AC

## 2023-06-29 NOTE — Patient Instructions (Signed)
Follow up as needed or as scheduled START the Symbicort nightly before bed I'll start the ADHD referral process to get you formally evaluated Monitor/log any symptoms you may be having so we can see if there's a pattern or trigger Call with any questions or concerns Hang in there!

## 2023-06-29 NOTE — Progress Notes (Signed)
Subjective:    Patient ID: Brittany Lindsey, female    DOB: 25-Feb-1991, 32 y.o.   MRN: [id number]  HPI 'i get choked up in my sleep and it wakes me up'- has happened multiple times.  Last night she felt that she could not take a full breath.  Then started to panic.  BP elevated.  Did sleep study last year that showed mild sleep apnea but she was not able to tolerate the mask.  Pt reports that 'during the day I'm fine'.  Denies GERD.  'i lose my focus a lot'- works 12 hr shifts.  Will start multiple things without finishing anything.  Will interrupt conversations w/ other thoughts.  Will procrastinate.  Pt reports this is an ongoing issue.  Never mentioned it before b/c she felt 'it was my normal'.  Has never been formally diagnosed w/ ADHD.     Review of Systems For ROS see HPI     Objective:   Physical Exam Vitals reviewed.  Constitutional:      General: She is not in acute distress.    Appearance: Normal appearance. She is well-developed. She is obese. She is not ill-appearing.  HENT:     Head: Normocephalic and atraumatic.  Eyes:     Conjunctiva/sclera: Conjunctivae normal.     Pupils: Pupils are equal, round, and reactive to light.  Neck:     Thyroid: No thyromegaly.  Cardiovascular:     Rate and Rhythm: Normal rate and regular rhythm.     Heart sounds: Normal heart sounds. No murmur heard. Pulmonary:     Effort: Pulmonary effort is normal. No respiratory distress.     Breath sounds: Normal breath sounds.  Abdominal:     General: There is no distension.     Palpations: Abdomen is soft.     Tenderness: There is no abdominal tenderness.  Musculoskeletal:     Cervical back: Normal range of motion and neck supple.  Lymphadenopathy:     Cervical: No cervical adenopathy.  Skin:    General: Skin is warm and dry.  Neurological:     General: No focal deficit present.     Mental Status: She is alert and oriented to person, place, and time.  Psychiatric:        Mood and  Affect: Mood normal.        Behavior: Behavior normal.        Thought Content: Thought content normal.           Assessment & Plan:   SOB- new.  Pt reports this is only occurring at night.  States it feels like she gets 'choked up' and she isn't able to breathe.  When sxs occur, she feels like she cannot take a full breath.  Does not have daytime sxs.  Denies GERD which was my first guess and still a possibility.  May be having nocturnal RAD.  Will start Symbicort to see if sxs improve.  Pt is to try and log her sxs to see how often they occur and if there's a pattern or trigger.  Pt expressed understanding and is in agreement w/ plan.   Attention deficit- new.  Pt has never been dx'd w/ ADHD but reports this has been going on for years.  She just assumed it was her normal.  But now having increased difficulty staying on task.  Will start multiple things but have a hard time finishing anything.  Will procrastinate b/c she feels like she doesn't know where  to start.  Will make formal referral for ADHD testing.  Pt expressed understanding and is in agreement w/ plan.

## 2023-07-01 ENCOUNTER — Encounter: Payer: BLUE CROSS/BLUE SHIELD | Attending: Family Medicine | Admitting: Dietician

## 2023-07-01 ENCOUNTER — Encounter: Payer: Self-pay | Admitting: Dietician

## 2023-07-01 NOTE — Progress Notes (Signed)
Medical Nutrition Therapy  Appointment Start time:  0800  Appointment End time:  0900  Primary concerns today: PCOS, weight loss, prediabetes   Referral diagnosis: E66.01 Preferred learning style: no preference indicated Learning readiness: ready   NUTRITION ASSESSMENT   Anthropometrics  Wt: 286 lb (pt report)  Clinical Medical Hx: prediabetes, PCOS Medications: metformin Labs: 04/06/23 A1c 6.0,  Notable Signs/Symptoms: none reported Food Allergies: mushrooms  Lifestyle & Dietary Hx  Pt states she was told to stick to a 1200 calorie diet by her doctor but is unsure what this means and how to do that.    Pt states found out about PCOS diagnosis 10 years ago.   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 Pt states she leaves for work at [employer] and usually gets something on the way to work at [employer]. At work she either packs lunch or they doordash, and if she snacks she reports grabbing something from the vending machine such as a soda and m&ms.    Pt states she cooks on her days off.   Pt states when she was drinking a lot of tea she was getting kidney pain then reduced it and noticed the pain go away.   Pt states he has a walking pad at home.   Estimated daily fluid intake: 32-48 oz Supplements: none reported Sleep: 6-7 hours Stress / self-care: moderate stress Current average weekly physical activity: ADLs  24-Hr Dietary Recall First Meal: 7am (when gets off work): 2 pieces toast and orange juice OR banana go go squeeze Snack: none (asleep) Second Meal: 12pm: zatarans TV dinner OR chicken alfredo OR spaghetti OR 2 pieces toast Snack: none Third Meal: 6pm: (leaves for work): [employer]: chicken nuggets and drink Meal at work: 11-1am: [employer] OR vending machine Snack: vending machine: peanut m&ms and soda Beverages: 2 sodas, 1 glass juice, 48 oz water   NUTRITION DIAGNOSIS  [address]2 Altered nutrition-related laboratory As related to  prediabetes.  As evidenced by A1c 6.0%.   NUTRITION INTERVENTION  Nutrition education (E-1) on the following topics:   Prediabetes Prediabetes: Prediabetes is a condition where blood sugar levels are higher than normal but not yet high enough to be diagnosed as type 2 diabetes. A1C, or hemoglobin A1c, is a blood test that provides an average of a person's blood sugar levels over the past two to three months. It is commonly used to diagnose and monitor diabetes. For prediabetes, an A1C level between 5.7% and 6.4% typically is used to diagnose this. Here is how the A1C levels are generally categorized: Normal:  A1C below 5.7% Prediabetes:  A1C between 5.7% and 6.4% Diabetes:  A1C of 6.5% or higher When diagnosed with prediabetes, there are several lifestyle changes you can make to manage the condition: Healthy Eating:  Follow a well-balanced diet that includes a variety of fruits, vegetables, whole grains, lean proteins, and healthy fats. Monitor portion sizes and reduce intake of sugary and processed foods. Regular Physical Activity:  Engage in regular physical activity, such as brisk walking, cycling, or other aerobic exercises, for at least 150 minutes per week. Include strength training exercises at least twice a week. Weight Management: Achieve and maintain a healthy weight. Losing even a small amount of weight (3-5%) can significantly improve insulin sensitivity.  Plate Method Fruits & Vegetables: Aim to fill half your plate with a variety of fruits and vegetables. They are rich in vitamins, minerals, and fiber, and can help reduce the risk of chronic  diseases. Choose a colorful assortment of fruits and vegetables to ensure you get a wide range of nutrients. Grains and Starches: Make at least half of your grain choices whole grains, such as brown rice, whole wheat bread, and oats. Whole grains provide fiber, which aids in digestion and healthy cholesterol levels. Aim for whole forms of  starchy vegetables such as potatoes, sweet potatoes, beans, peas, and corn, which are fiber rich and provide many vitamins and minerals.  Protein: Incorporate lean sources of protein, such as poultry, fish, beans, nuts, and seeds, into your meals. Protein is essential for building and repairing tissues, staying full, balancing blood sugar, as well as supporting immune function. Dairy: Include low-fat or fat-free dairy products like milk, yogurt, and cheese in your diet. Dairy foods are excellent sources of calcium and vitamin D, which are crucial for bone health.    Handouts Provided Include  Plate Method Snack Ideas  Learning Style & Readiness for Change Teaching method utilized: Visual & Auditory  Demonstrated degree of understanding via: Teach Back  Barriers to learning/adherence to lifestyle change: none  Goals Established by Pt  Goal: use the walking pad for 30 minutes on Sunday, Monday, Tuesday.   Goal: Try zero sugar soda.   Goal: Aim to drink 1 of your bottles (64 oz) of water daily.   Other Tips:  -Try Spindrift sparkling water.  -When snacking, aim to include a complex carb and protein. -At meals, aim to include 1/2 plate non-starchy vegetables, 1/4 plate protein, and 1/4 plate complex carbs.    MONITORING & EVALUATION Dietary intake, weekly physical activity, and follow up in 3 months.  Next Steps  Patient is to call for questions.

## 2023-07-01 NOTE — Patient Instructions (Signed)
Goal: use the walking pad for 30 minutes on Sunday, Monday, Tuesday.   Goal: Try zero sugar soda.   Goal: Aim to drink 1 of your bottles (64 oz) of water daily.   Other Tips:  -Try Spindrift sparkling water.  -When snacking, aim to include a complex carb and protein. -At meals, aim to include 1/2 plate non-starchy vegetables, 1/4 plate protein, and 1/4 plate complex carbs.

## 2023-07-03 ENCOUNTER — Encounter: Payer: Self-pay | Admitting: Family Medicine

## 2023-07-21 ENCOUNTER — Encounter: Payer: Self-pay | Admitting: Family Medicine

## 2023-07-21 ENCOUNTER — Ambulatory Visit (INDEPENDENT_AMBULATORY_CARE_PROVIDER_SITE_OTHER): Payer: BLUE CROSS/BLUE SHIELD | Admitting: Family Medicine

## 2023-07-21 VITALS — BP 108/78 | HR 78 | Temp 98.1°F | Ht 64.0 in | Wt 283.1 lb

## 2023-07-21 DIAGNOSIS — E559 Vitamin D deficiency, unspecified: Secondary | ICD-10-CM

## 2023-07-21 DIAGNOSIS — M5441 Lumbago with sciatica, right side: Secondary | ICD-10-CM

## 2023-07-21 DIAGNOSIS — Z0000 Encounter for general adult medical examination without abnormal findings: Secondary | ICD-10-CM

## 2023-07-21 DIAGNOSIS — M5442 Lumbago with sciatica, left side: Secondary | ICD-10-CM

## 2023-07-21 DIAGNOSIS — Z202 Contact with and (suspected) exposure to infections with a predominantly sexual mode of transmission: Secondary | ICD-10-CM

## 2023-07-21 DIAGNOSIS — Z1159 Encounter for screening for other viral diseases: Secondary | ICD-10-CM | POA: Diagnosis not present

## 2023-07-21 DIAGNOSIS — G8929 Other chronic pain: Secondary | ICD-10-CM | POA: Insufficient documentation

## 2023-07-21 LAB — LIPID PANEL
Cholesterol: 148 mg/dL (ref 0–200)
HDL: 41.7 mg/dL (ref >39.00)
LDL Cholesterol: 76 mg/dL (ref 0–99)
NonHDL: 106.28
Total CHOL/HDL Ratio: 4
Triglycerides: 149 mg/dL (ref 0.0–149.0)
VLDL: 29.8 mg/dL (ref 0.0–40.0)

## 2023-07-21 LAB — BASIC METABOLIC PANEL
BUN: 8 mg/dL (ref 6–23)
CO2: 24 meq/L (ref 19–32)
Calcium: 8.7 mg/dL (ref 8.4–10.5)
Chloride: 107 meq/L (ref 96–112)
Creatinine, Ser: 0.77 mg/dL (ref 0.40–1.20)
GFR: 100.64 mL/min (ref >60.00)
Glucose, Bld: 103 mg/dL — ABNORMAL HIGH (ref 70–99)
Potassium: 3.7 meq/L (ref 3.5–5.1)
Sodium: 139 meq/L (ref 135–145)

## 2023-07-21 LAB — CBC WITH DIFFERENTIAL/PLATELET
Basophils Absolute: 0.1 10*3/uL (ref 0.0–0.1)
Basophils Relative: 0.9 % (ref 0.0–3.0)
Eosinophils Absolute: 0.1 10*3/uL (ref 0.0–0.7)
Eosinophils Relative: 1.4 % (ref 0.0–5.0)
HCT: 41.6 % (ref 36.0–46.0)
Hemoglobin: 14 g/dL (ref 12.0–15.0)
Lymphocytes Relative: 29.8 % (ref 12.0–46.0)
Lymphs Abs: 2.4 10*3/uL (ref 0.7–4.0)
MCHC: 33.6 g/dL (ref 30.0–36.0)
MCV: 84.8 fL (ref 78.0–100.0)
Monocytes Absolute: 0.5 10*3/uL (ref 0.1–1.0)
Monocytes Relative: 6.4 % (ref 3.0–12.0)
Neutro Abs: 4.8 10*3/uL (ref 1.4–7.7)
Neutrophils Relative %: 61.5 % (ref 43.0–77.0)
Platelets: 259 10*3/uL (ref 150.0–400.0)
RBC: 4.91 Mil/uL (ref 3.87–5.11)
RDW: 12.8 % (ref 11.5–15.5)
WBC: 7.9 10*3/uL (ref 4.0–10.5)

## 2023-07-21 LAB — HEPATIC FUNCTION PANEL
ALT: 31 U/L (ref 0–35)
AST: 25 U/L (ref 0–37)
Albumin: 4 g/dL (ref 3.5–5.2)
Alkaline Phosphatase: 60 U/L (ref 39–117)
Bilirubin, Direct: 0.1 mg/dL (ref 0.0–0.3)
Total Bilirubin: 0.6 mg/dL (ref 0.2–1.2)
Total Protein: 7.2 g/dL (ref 6.0–8.3)

## 2023-07-21 LAB — VITAMIN D 25 HYDROXY (VIT D DEFICIENCY, FRACTURES): VITD: 14.41 ng/mL — ABNORMAL LOW (ref 30.00–100.00)

## 2023-07-21 LAB — TSH: TSH: 2.22 u[IU]/mL (ref 0.35–5.50)

## 2023-07-21 LAB — HEMOGLOBIN A1C: Hgb A1c MFr Bld: 6 % (ref 4.6–6.5)

## 2023-07-21 MED ORDER — MELOXICAM 15 MG PO TABS: 15.0000 mg | ORAL_TABLET | Freq: Every day | ORAL | 3 refills | Status: AC

## 2023-07-21 NOTE — Assessment & Plan Note (Signed)
Ongoing issue for pt.  Encouraged low carb diet and regular exercise.  Check labs to risk stratify.  Will follow.

## 2023-07-21 NOTE — Assessment & Plan Note (Signed)
New to provider, worsening for pt.  Was previously seeing chiropractor who dx'd her w/ DDD.  Recently pt reports pain has been worsening and now radiating down into her legs.  Will start daily Meloxicam to help w/ pain and inflammation and she wants to resume chiropractic care.  Will follow.

## 2023-07-21 NOTE — Assessment & Plan Note (Signed)
PE WNL w/ exception of BMI.  UTD on pap, Tdap.  Check labs.  Anticipatory guidance provided.

## 2023-07-21 NOTE — Patient Instructions (Signed)
Follow up in 6 months to recheck weight loss progress We'll notify you of your lab results and make any changes if needed START the Meloxicam once daily for pain and inflammation- take w/ food Continue to work on healthy diet and regular exercise- you can do it! Call and schedule with your chiropractor for your back pain Call with any questions or concerns Stay Safe!  Stay Healthy! Happy Holidays!

## 2023-07-21 NOTE — Progress Notes (Signed)
   Subjective:    Patient ID: Brittany Lindsey, female    DOB: February 22, 1991, 32 y.o.   MRN: [id number]  HPI CPE- UTD on pap, Tdap.  Pt reports feeling good.  Patient Care Team    Relationship Specialty Notifications Start End  Sheliah Hatch, MD PCP - General Family Medicine  02/10/14   Silverio Lay, MD Consulting Physician Obstetrics and Gynecology  10/02/15     Health Maintenance  Topic Date Due   Hepatitis C Screening  Never done   INFLUENZA VACCINE  04/09/2023   COVID-19 Vaccine (3 - 2023-24 season) 05/10/2023   Cervical Cancer Screening (HPV/Pap Cotest)  11/20/2024   DTaP/Tdap/Td (3 - Td or Tdap) 03/29/2025   HIV Screening  Completed   HPV VACCINES  Aged Out      Review of Systems Patient reports no vision/ hearing changes, adenopathy,fever, weight change,  persistant/recurrent hoarseness , swallowing issues, chest pain, palpitations, edema, persistant/recurrent cough, hemoptysis, dyspnea (rest/exertional/paroxysmal nocturnal), gastrointestinal bleeding (melena, rectal bleeding), abdominal pain, significant heartburn, bowel changes, GU symptoms (dysuria, hematuria, incontinence), Gyn symptoms (abnormal  bleeding, pain),  syncope, focal weakness, memory loss, numbness & tingling, skin/hair/nail changes, abnormal bruising or bleeding, anxiety, or depression.   + LBP- pt reports pain is becoming more severe.  Pain is sharp and will radiate down legs (not at the same time, but does alternate).  Previously seeing Chiropractor (Dr Gwinda Passe).  Leaning forward causes back pain.    Objective:   Physical Exam General Appearance:    Alert, cooperative, no distress, appears stated age, obese  Head:    Normocephalic, without obvious abnormality, atraumatic  Eyes:    PERRL, conjunctiva/corneas clear, EOM's intact both eyes  Ears:    Normal TM's and external ear canals, both ears  Nose:   Nares normal, septum midline, mucosa normal, no drainage    or sinus tenderness  Throat:   Lips,  mucosa, and tongue normal; teeth and gums normal  Neck:   Supple, symmetrical, trachea midline, no adenopathy;    Thyroid: no enlargement/tenderness/nodules  Back:     Symmetric, no curvature, ROM normal, no CVA tenderness  Lungs:     Clear to auscultation bilaterally, respirations unlabored  Chest Wall:    No tenderness or deformity   Heart:    Regular rate and rhythm, S1 and S2 normal, no murmur, rub   or gallop  Breast Exam:    Deferred to GYN  Abdomen:     Soft, non-tender, bowel sounds active all four quadrants,    no masses, no organomegaly  Genitalia:    Deferred to GYN  Rectal:    Extremities:   Extremities normal, atraumatic, no cyanosis or edema  Pulses:   2+ and symmetric all extremities  Skin:   Skin color, texture, turgor normal, no rashes or lesions  Lymph nodes:   Cervical, supraclavicular, and axillary nodes normal  Neurologic:   CNII-XII intact, normal strength, sensation and reflexes    throughout          Assessment & Plan:

## 2023-07-22 ENCOUNTER — Telehealth: Payer: Self-pay

## 2023-07-22 ENCOUNTER — Other Ambulatory Visit: Payer: Self-pay

## 2023-07-22 DIAGNOSIS — E559 Vitamin D deficiency, unspecified: Secondary | ICD-10-CM

## 2023-07-22 LAB — RPR: RPR Ser Ql: NONREACTIVE

## 2023-07-22 LAB — HEPATITIS C ANTIBODY: Hepatitis C Ab: NONREACTIVE

## 2023-07-22 LAB — HIV ANTIBODY (ROUTINE TESTING W REFLEX): HIV 1&2 Ab, 4th Generation: NONREACTIVE

## 2023-07-22 MED ORDER — VITAMIN D (ERGOCALCIFEROL) 1.25 MG (50000 UNIT) PO CAPS: 50000.0000 [IU] | ORAL_CAPSULE | ORAL | 0 refills | Status: DC

## 2023-07-22 NOTE — Telephone Encounter (Signed)
Negative for HIV and Hep C- good news!

## 2023-07-22 NOTE — Telephone Encounter (Signed)
-----   Message from Neena Rhymes sent at 07/22/2023  7:27 AM EST ----- Labs look good w/ exception of low Vit D.  Based on this, we need to start 50,000 units weekly x12 weeks in addition to daily OTC supplement of at least 2000 units.   Still waiting on STD screening results

## 2023-07-22 NOTE — Telephone Encounter (Signed)
Labs look good w/ exception of low Vit D.  Based on this, we need to start 50,000 units weekly x12 weeks in addition to daily OTC supplement of at least 2000 units.

## 2023-07-22 NOTE — Telephone Encounter (Signed)
-----   Message from Neena Rhymes sent at 07/22/2023  2:20 PM EST ----- Negative for HIV and Hep C- good news!

## 2023-07-30 ENCOUNTER — Encounter: Payer: Self-pay | Admitting: Family Medicine

## 2023-07-31 MED ORDER — BUPROPION HCL ER (XL) 150 MG PO TB24: 150.0000 mg | ORAL_TABLET | Freq: Every day | ORAL | 3 refills | Status: DC

## 2023-07-31 NOTE — Addendum Note (Signed)
Addended by: Sheliah Hatch on: 07/31/2023 07:43 AM   Modules accepted: Orders

## 2023-08-15 ENCOUNTER — Other Ambulatory Visit: Payer: Self-pay | Admitting: "Endocrinology

## 2023-08-17 ENCOUNTER — Encounter: Payer: Self-pay | Admitting: "Endocrinology

## 2023-08-17 ENCOUNTER — Telehealth (INDEPENDENT_AMBULATORY_CARE_PROVIDER_SITE_OTHER): Payer: BLUE CROSS/BLUE SHIELD | Admitting: "Endocrinology

## 2023-08-17 DIAGNOSIS — E282 Polycystic ovarian syndrome: Secondary | ICD-10-CM

## 2023-08-17 DIAGNOSIS — R7303 Prediabetes: Secondary | ICD-10-CM

## 2023-08-17 DIAGNOSIS — E88819 Insulin resistance, unspecified: Secondary | ICD-10-CM

## 2023-08-17 NOTE — Progress Notes (Addendum)
The patient reports they are currently: . I spent 7-8 minutes on the video with the patient on the date of service. I spent an additional 5-10 minutes on pre- and post-visit activities on the date of service.   The patient was physically located in [place] or a state in which I am permitted to provide care. The patient and/or parent/guardian understood that s/he may incur co-pays and cost sharing, and agreed to the telemedicine visit. The visit was reasonable and appropriate under the circumstances given the patient's presentation at the time.  The patient and/or parent/guardian has been advised of the potential risks and limitations of this mode of treatment (including, but not limited to, the absence of in-person examination) and has agreed to be treated using telemedicine. The patient's/patient's family's questions regarding telemedicine have been answered.   The patient and/or parent/guardian has also been advised to contact their provider's office for worsening conditions, and seek emergency medical treatment and/or call 911 if the patient deems either necessary.    Outpatient Endocrinology Note Brittany Lindsey , MD    Brittany Lindsey 1991/06/02 [id number]  Referring Provider: Sheliah Hatch, MD Primary Care Provider: Sheliah Hatch, MD Reason for consultation: Subjective   Assessment & Plan  Diagnoses and all orders for this visit:  PCOS (polycystic ovarian syndrome)  Insulin resistance  Pre-diabetes   Diagnosed of PCOS since 2014 Stopped metformin due to various side effects, was on 1000 mg twice daily at 1 point On metformin XR 500 mg once a day  On spironolactone at 50 mg daily Patient reports it is working well for her, no known S/E   Obesity complicated by insulin resistance, new pre-diabetes as well as hyperlipidemia, current There is no height or weight on file to calculate BMI. Lost 15 lbs on Wellbutrin XL since 05/2023, starting weight 296  lbs, on higher dose now and feels good Open to GLP1 if covered by insurance, pt will find out; ozempic denied, wegovy tried by pcp  Phentermine remains an option, no known contraindications Pt follows with a nutritionist No plan for weight loss surgery at this point Patient interested in GLP-1, No history of MEN syndrome/medullary thyroid cancer/pancreatitis or pancreatic cancer in self or family Maintain healthy lifestyle including 1200 Cal/day, 30 min of activity/day, avoiding refined/processed/outside food 20 minutes physical activity per day, in continuum or interruptedly through the day  Goals: less than 60 grams of carbohydrate/meal, 1200-1500 Cal/day, 10,0000 steps a day and weight loss of 0.5-1 lb/ wk    Continue to follow-up with OB/GYN for menstrual cycle and [facility] for fertility issues  No follow-ups on file.   I have reviewed current medications, nurse's notes, allergies, vital signs, past medical and surgical history, family medical history, and social history for this encounter. Counseled patient on symptoms, examination findings, lab findings, imaging results, treatment decisions and monitoring and prognosis. The patient understood the recommendations and agrees with the treatment plan. All questions regarding treatment plan were fully answered.  Brittany Lindsey , MD  08/17/23   History of Present Illness HPI  Brittany Lindsey is a 32 y.o. year old female who presents for evaluation of PCOS. Brittany Lindsey was first diagnosed of PCOS in 2014.    Current regimen: Wellbutrin XL 450 mg by pcp-been on wellbutrin since 05/2023 Spironolactone 50 mg every day-started by Ob-gyn  Metformin XR 500 mg every day   Patient reports issues with menstrual cycle in th past and  Went to [facility] for ovulation  Was on metformin  but reports dizziness sweaty, shaking, was on 1000 mg bid  Started on spironolactone 25 mg by Ob-gyn  Gets facial hair waxed     Physical Exam  There were no vitals taken for this visit.   Constitutional: well developed, well nourished, thick facial hair on upper lips and chin Head: normocephalic, atraumatic Eyes: sclera anicteric, no redness Neck: supple Lungs: normal respiratory effort Neurology: alert and oriented Skin: dry, no appreciable rashes Musculoskeletal: no appreciable defects Psychiatric: normal mood and affect   Current Medications Patient's Medications  New Prescriptions   No medications on file  Previous Medications   ALBUTEROL (VENTOLIN HFA) 108 (90 BASE) MCG/ACT INHALER    INHALE 2 PUFFS BY MOUTH INTO THE LUNGS EVERY 6 HOURS AS NEEDED FOR WHEEZING OR SHORTNESS OF BREATH   BUDESONIDE-FORMOTEROL (SYMBICORT) 80-4.5 MCG/ACT INHALER    Inhale 2 puffs into the lungs 2 (two) times daily.   BUPROPION (WELLBUTRIN XL) 150 MG 24 HR TABLET    Take 1 tablet (150 mg total) by mouth daily. In addition to the 300mg  for a total of 450mg  daily   BUPROPION (WELLBUTRIN XL) 300 MG 24 HR TABLET    Take 1 tablet (300 mg total) by mouth daily.   MELOXICAM (MOBIC) 15 MG TABLET    Take 1 tablet (15 mg total) by mouth daily.   METFORMIN (GLUCOPHAGE-XR) 500 MG 24 HR TABLET    Take 1 tablet (500 mg total) by mouth daily with breakfast.   METRONIDAZOLE (METROGEL) 1 % GEL    Apply topically daily.   SPIRONOLACTONE (ALDACTONE) 50 MG TABLET    Take 1 tablet (50 mg total) by mouth daily.   VITAMIN D, ERGOCALCIFEROL, (DRISDOL) 1.25 MG (50000 UNIT) CAPS CAPSULE    Take 1 capsule (50,000 Units total) by mouth every 7 (seven) days.  Modified Medications   No medications on file  Discontinued Medications   No medications on file    Allergies Allergies  Allergen Reactions   Mushroom Extract Complex (Do Not Select) Hives   Penicillins Hives    Has patient had a PCN reaction causing immediate rash, facial/tongue/throat swelling, SOB or lightheadedness with hypotension: No Has patient had a PCN reaction causing severe rash  involving mucus membranes or skin necrosis: No-- pt had hives Has patient had a PCN reaction that required hospitalization: No Has patient had a PCN reaction occurring within the last 10 years: No If all of the above answers are "NO", then may proceed with Cephalosporin use.     Past Medical History Past Medical History:  Diagnosis Date   PCOS (polycystic ovarian syndrome)    Scoliosis    Tubo-ovarian abscess Feb 2016    Past Surgical History Past Surgical History:  Procedure Laterality Date   CHROMOPERTUBATION N/A 11/02/2015   Procedure: CHROMOPERTUBATION;  Surgeon: Silverio Lay, MD;  Location: [facility];  Service: Gynecology;  Laterality: N/A;   LAPAROSCOPY Right 11/02/2015   Procedure: LAPAROSCOPY OPERATIVE with Cure of Hydrosalpynx  with Right Salpingectomy;  Surgeon: Silverio Lay, MD;  Location: [facility];  Service: Gynecology;  Laterality: Right;   LYSIS OF ADHESION N/A 11/02/2015   Procedure: EXTENSIVE LYSIS OF ADHESION;  Surgeon: Silverio Lay, MD;  Location: [facility];  Service: Gynecology;  Laterality: N/A;    Family History family history includes Diabetes in her father and mother; Hyperlipidemia in her mother; Hypertension in her father; Lupus in an other family member; Seizures in her mother; Thyroid disease in her maternal grandmother.  Social History Social History  Socioeconomic History   Marital status: Single    Spouse name: Not on file   Number of children: Not on file   Years of education: Not on file   Highest education level: Associate degree: academic program  Occupational History   Not on file  Tobacco Use   Smoking status: Never   Smokeless tobacco: Never  Vaping Use   Vaping status: Never Used  Substance and Sexual Activity   Alcohol use: No   Drug use: No   Sexual activity: Yes    Birth control/protection: None  Other Topics Concern   Not on file  Social History Narrative   Not on file   Social Determinants of Health   Financial Resource  Strain: Low Risk  (06/29/2023)   Overall Financial Resource Strain (CARDIA)    Difficulty of Paying Living Expenses: Not hard at all  Food Insecurity: No Food Insecurity (07/01/2023)   Hunger Vital Sign    Worried About Running Out of Food in the Last Year: Never true    Ran Out of Food in the Last Year: Never true  Transportation Needs: No Transportation Needs (06/29/2023)   PRAPARE - Administrator, Civil Service (Medical): No    Lack of Transportation (Non-Medical): No  Physical Activity: Insufficiently Active (06/29/2023)   Exercise Vital Sign    Days of Exercise per Week: 2 days    Minutes of Exercise per Session: 20 min  Stress: No Stress Concern Present (06/29/2023)   [employer] of Occupational Health - Occupational Stress Questionnaire    Feeling of Stress : Not at all  Social Connections: Socially Isolated (06/29/2023)   Social Connection and Isolation Panel [NHANES]    Frequency of Communication with Friends and Family: Never    Frequency of Social Gatherings with Friends and Family: Never    Attends Religious Services: Never    Database administrator or Organizations: No    Attends Engineer, structural: Not on file    Marital Status: Never married  Intimate Partner Violence: Unknown (03/07/2023)   Received from [facility]   HITS    Physically Hurt: Not on file    Insult or Talk Down To: Not on file    Threaten Physical Harm: Not on file    Scream or Curse: Not on file    Lab Results  Component Value Date   CHOL 148 07/21/2023   Lab Results  Component Value Date   HDL 41.70 07/21/2023   Lab Results  Component Value Date   LDLCALC 76 07/21/2023   Lab Results  Component Value Date   TRIG 149.0 07/21/2023   Lab Results  Component Value Date   CHOLHDL 4 07/21/2023   Lab Results  Component Value Date   CREATININE 0.77 07/21/2023   Lab Results  Component Value Date   GFR 100.64 07/21/2023      Component Value Date/Time    NA 139 07/21/2023 1125   K 3.7 07/21/2023 1125   CL 107 07/21/2023 1125   CO2 24 07/21/2023 1125   GLUCOSE 103 (H) 07/21/2023 1125   BUN 8 07/21/2023 1125   CREATININE 0.77 07/21/2023 1125   CREATININE 0.70 03/30/2015 1441   CALCIUM 8.7 07/21/2023 1125   PROT 7.2 07/21/2023 1125   ALBUMIN 4.0 07/21/2023 1125   AST 25 07/21/2023 1125   ALT 31 07/21/2023 1125   ALKPHOS 60 07/21/2023 1125   BILITOT 0.6 07/21/2023 1125   GFRNONAA >60 11/13/2017 1525  GFRAA >60 11/13/2017 1525      Latest Ref Rng & Units 07/21/2023   11:25 AM 04/06/2023    9:51 AM 01/19/2023   10:50 AM  BMP  Glucose 70 - 99 mg/dL 161  096  045   BUN 6 - 23 mg/dL 8  10  11    Creatinine 0.40 - 1.20 mg/dL 4.09  8.11  9.14   Sodium 135 - 145 mEq/L 139  137  138   Potassium 3.5 - 5.1 mEq/L 3.7  3.3  3.7   Chloride 96 - 112 mEq/L 107  103  104   CO2 19 - 32 mEq/L 24  25  24    Calcium 8.4 - 10.5 mg/dL 8.7  9.0  8.9        Component Value Date/Time   WBC 7.9 07/21/2023 1125   RBC 4.91 07/21/2023 1125   HGB 14.0 07/21/2023 1125   HCT 41.6 07/21/2023 1125   PLT 259.0 07/21/2023 1125   MCV 84.8 07/21/2023 1125   MCH 28.3 11/13/2017 1525   MCHC 33.6 07/21/2023 1125   RDW 12.8 07/21/2023 1125   LYMPHSABS 2.4 07/21/2023 1125   MONOABS 0.5 07/21/2023 1125   EOSABS 0.1 07/21/2023 1125   BASOSABS 0.1 07/21/2023 1125   Lab Results  Component Value Date   TSH 2.22 07/21/2023   TSH 2.53 04/06/2023   TSH 2.57 01/19/2023   FREET4 0.81 01/19/2023   FREET4 0.73 01/29/2022         Parts of this note may have been dictated using voice recognition software. There may be variances in spelling and vocabulary which are unintentional. Not all errors are proofread. Please notify the Thereasa Parkin if any discrepancies are noted or if the meaning of any statement is not clear.

## 2023-09-12 ENCOUNTER — Other Ambulatory Visit: Payer: Self-pay | Admitting: "Endocrinology

## 2023-09-22 ENCOUNTER — Ambulatory Visit: Payer: BLUE CROSS/BLUE SHIELD | Admitting: Dietician

## 2023-09-24 ENCOUNTER — Ambulatory Visit: Payer: BLUE CROSS/BLUE SHIELD | Admitting: Family Medicine

## 2023-09-24 ENCOUNTER — Encounter: Payer: Self-pay | Admitting: Family Medicine

## 2023-09-29 ENCOUNTER — Encounter: Payer: Self-pay | Admitting: Family Medicine

## 2023-09-30 ENCOUNTER — Encounter: Payer: Self-pay | Admitting: Family Medicine

## 2023-09-30 ENCOUNTER — Ambulatory Visit: Payer: BLUE CROSS/BLUE SHIELD | Admitting: Family Medicine

## 2023-09-30 VITALS — BP 118/78 | HR 95 | Temp 98.3°F | Ht 64.0 in | Wt 281.2 lb

## 2023-09-30 DIAGNOSIS — L0232 Furuncle of buttock: Secondary | ICD-10-CM

## 2023-09-30 DIAGNOSIS — K644 Residual hemorrhoidal skin tags: Secondary | ICD-10-CM

## 2023-09-30 DIAGNOSIS — K5900 Constipation, unspecified: Secondary | ICD-10-CM | POA: Diagnosis not present

## 2023-09-30 MED ORDER — HYDROCORTISONE ACETATE 25 MG RE SUPP: 25.0000 mg | Freq: Two times a day (BID) | RECTAL | 0 refills | Status: AC

## 2023-09-30 MED ORDER — DOXYCYCLINE HYCLATE 100 MG PO TABS: 100.0000 mg | ORAL_TABLET | Freq: Two times a day (BID) | ORAL | 0 refills | Status: DC

## 2023-09-30 MED ORDER — POLYETHYLENE GLYCOL 3350 17 GM/SCOOP PO POWD: 17.0000 g | Freq: Every day | ORAL | 1 refills | Status: AC

## 2023-09-30 NOTE — Patient Instructions (Addendum)
Follow up as needed or as scheduled START the Hydrocortisone suppositories as directed USE the Miralax daily until your bowels are more regular- dissolve 1 capful in 8 oz of fluid Drink LOTS of water to help w/ constipation TAKE the Doxycycline twice daily- w/ food- to treat the boils Hot compresses will also help the boils come to a head and possibly drain Call with any questions or concerns- particularly if things aren't improving Hang in there!!

## 2023-09-30 NOTE — Progress Notes (Signed)
   Subjective:    Patient ID: Brittany Lindsey, female    DOB: 1991-08-27, 33 y.o.   MRN: [id number]  HPI BRBPR- pt reports that starting mid December she developed constipation.  Was having to really strain to have BM.  She was constipated for about 2 weeks before she had rectal pain and bleeding.  Blood was bright red.  At times noticed clots.  It got to the point that she would have blood with passing gas.  Pt is concerned regarding odor.  Pt reports she is able to feel a lump- not TTP.  + itching.  Pt reports frequency of blood is decreasing.  Is not taking anything OTC for constipation.  Boil- pt reports she has a boil on her L buttock and L panty line area.     Review of Systems For ROS see HPI     Objective:   Physical Exam Vitals reviewed.  Constitutional:      General: She is not in acute distress.    Appearance: Normal appearance. She is obese. She is not ill-appearing.  Cardiovascular:     Rate and Rhythm: Normal rate.  Pulmonary:     Effort: Pulmonary effort is normal. No respiratory distress.  Abdominal:     General: There is no distension.     Palpations: Abdomen is soft.     Tenderness: There is no abdominal tenderness. There is no guarding.  Genitourinary:    Rectum: Tenderness and external hemorrhoid present. No mass or anal fissure. Normal anal tone.  Skin:    General: Skin is warm and dry.     Comments: Firm area on L lower buttock, TTP w/o area to drain Similar firm area in L groin, TTP w/o area to drain  Neurological:     General: No focal deficit present.     Mental Status: She is alert and oriented to person, place, and time.  Psychiatric:        Mood and Affect: Mood normal.        Behavior: Behavior normal.        Thought Content: Thought content normal.           Assessment & Plan:  Constipation- new.  Pt reports sxs were severe for 2 weeks and then she developed pain and bleeding.  She has not tried anything OTC for constipation.  Start daily  Miralax, increase water and fiber intake.  Pt expressed understanding and is in agreement w/ plan.   External Hemorrhoid- new.  Likely caused by recent constipation and significant straining.  Start Hydrocortisone suppositories.  Treat constipation.  If no improvement, pt to let me know so we can refer to GI.  Pt expressed understanding and is in agreement w/ plan.   Boil- pt has hx of similar.  Neither boil has an area to drain.  Start Doxycycline BID.  Pt expressed understanding and is in agreement w/ plan.

## 2023-10-05 ENCOUNTER — Other Ambulatory Visit (HOSPITAL_COMMUNITY): Payer: Self-pay

## 2023-10-05 ENCOUNTER — Telehealth: Payer: Self-pay | Admitting: Pharmacy Technician

## 2023-10-05 NOTE — Telephone Encounter (Signed)
Pharmacy Patient Advocate Encounter   Received notification from CoverMyMeds that prior authorization for Hydrocortisone Acetate 25MG  suppositories is required/requested.   Insurance verification completed.   The patient is insured through [facility] MEDICAID .   Per test claim: PA required; PA submitted to above mentioned insurance via CoverMyMeds Key/confirmation #/EOC [id number] Status is pending

## 2023-10-06 NOTE — Telephone Encounter (Signed)
Called to discuss this, no answer, LM to call back so we can discuss this option

## 2023-10-06 NOTE — Telephone Encounter (Signed)
Please advise next steps considering Denial of Hydrocortisone Acetate 25mg 

## 2023-10-06 NOTE — Telephone Encounter (Signed)
If she uses GoodRx, the cost ranges from $12 at [facility] to $38 at [facility].  She should check [email or web address] and let us know where she wants the prescription sent.

## 2023-10-06 NOTE — Telephone Encounter (Signed)
Pharmacy Patient Advocate Encounter  Received notification from [facility] MEDICAID that Prior Authorization for Hydrocortisone Acetate 25MG  suppositories  has been DENIED.  Full denial letter will be uploaded to the media tab. See denial reason below.      PA #/Case ID/Reference #: [id number]

## 2023-10-07 ENCOUNTER — Other Ambulatory Visit (HOSPITAL_COMMUNITY): Payer: Self-pay

## 2023-10-07 NOTE — Telephone Encounter (Signed)
Called, again no answer, LM to call back for information about recent prescription

## 2023-10-07 NOTE — Telephone Encounter (Signed)
Pt has been notified Pt states she has Good RX card

## 2023-10-08 ENCOUNTER — Encounter: Payer: Self-pay | Admitting: Family Medicine

## 2023-10-16 ENCOUNTER — Encounter: Payer: Self-pay | Admitting: Family Medicine

## 2023-10-16 MED ORDER — ALPRAZOLAM 0.5 MG PO TABS: 0.5000 mg | ORAL_TABLET | Freq: Two times a day (BID) | ORAL | 0 refills | Status: AC | PRN

## 2023-10-17 ENCOUNTER — Encounter: Payer: Self-pay | Admitting: "Endocrinology

## 2023-10-22 ENCOUNTER — Ambulatory Visit: Payer: BLUE CROSS/BLUE SHIELD | Admitting: "Endocrinology

## 2023-11-12 ENCOUNTER — Ambulatory Visit (INDEPENDENT_AMBULATORY_CARE_PROVIDER_SITE_OTHER): Admitting: Family Medicine

## 2023-11-12 ENCOUNTER — Encounter: Payer: Self-pay | Admitting: Family Medicine

## 2023-11-12 VITALS — BP 110/78 | HR 83 | Temp 97.9°F | Wt 278.1 lb

## 2023-11-12 DIAGNOSIS — Z202 Contact with and (suspected) exposure to infections with a predominantly sexual mode of transmission: Secondary | ICD-10-CM | POA: Diagnosis not present

## 2023-11-12 NOTE — Patient Instructions (Signed)
 Follow up as needed or as scheduled We'll notify you of your lab results and make any changes if needed Make sure you are protecting yourself EVERY TIME!  You are worth it! Call with any questions or concerns Happy [place]!

## 2023-11-12 NOTE — Progress Notes (Signed)
   Subjective:    Patient ID: Brittany Lindsey, female    DOB: [birth year], 33 y.o.   MRN: [id number]  HPI Possible exposure to STD- pt's sexual partner told her that he has Hep B.  Pt is vaccinated as of 2022.  She is concerned her risk  Vaginal d/c- pt reports chunky vaginal d/c.  Is brown and she has associated menstrual sxs.  No odor.     Review of Systems For ROS see HPI     Objective:   Physical Exam Vitals reviewed.  Constitutional:      General: She is not in acute distress.    Appearance: Normal appearance. She is obese. She is not ill-appearing.  HENT:     Head: Normocephalic and atraumatic.  Eyes:     Extraocular Movements: Extraocular movements intact.     Conjunctiva/sclera: Conjunctivae normal.  Cardiovascular:     Rate and Rhythm: Normal rate and regular rhythm.  Pulmonary:     Effort: Pulmonary effort is normal. No respiratory distress.  Abdominal:     General: There is no distension.     Palpations: Abdomen is soft.     Tenderness: There is no abdominal tenderness. There is no guarding or rebound.  Genitourinary:    Comments: Pt deferred Skin:    General: Skin is warm and dry.  Neurological:     General: No focal deficit present.     Mental Status: She is alert and oriented to person, place, and time.  Psychiatric:        Mood and Affect: Mood normal.        Behavior: Behavior normal.        Thought Content: Thought content normal.           Assessment & Plan:   Possible exposure to STD- new.  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  This is concerning for possible Hep C.  Will check labs to determine if she needs to be treated.  Encouraged use of protection w/ each sexual encounter.  Pt expressed understanding and is in agreement w/ plan.

## 2023-11-13 ENCOUNTER — Encounter: Payer: Self-pay | Admitting: Family Medicine

## 2023-11-13 NOTE — Telephone Encounter (Signed)
 Called Patient to let her know of results for Hep B, Hep C and HIV. Patient verbalized understanding. Did let her know her other test results we are waiting on will be in her mychart once Dr. Beverely Low has reviewed them.

## 2023-11-13 NOTE — Telephone Encounter (Signed)
-----   Message from Neena Rhymes sent at 11/13/2023  2:58 PM EST ----- You are vaccinated and immune to Hep B- yay!  No evidence of HIV or Hep C- great news!  Waiting on the remaining tests but wanted you to know

## 2023-11-14 LAB — SURESWAB® ADVANCED VAGINITIS PLUS,TMA
C. trachomatis RNA, TMA: NOT DETECTED
CANDIDA SPECIES: NOT DETECTED
Candida glabrata: NOT DETECTED
N. gonorrhoeae RNA, TMA: NOT DETECTED
SURESWAB(R) ADV BACTERIAL VAGINOSIS(BV),TMA: NEGATIVE
TRICHOMONAS VAGINALIS (TV),TMA: NOT DETECTED

## 2023-11-14 LAB — HEPATITIS B SURFACE ANTIBODY,QUALITATIVE: Hep B S Ab: REACTIVE — AB

## 2023-11-14 LAB — HEPATITIS B SURFACE ANTIGEN: Hepatitis B Surface Ag: NONREACTIVE

## 2023-11-14 LAB — HEPATITIS C ANTIBODY: Hepatitis C Ab: NONREACTIVE

## 2023-11-14 LAB — HEPATITIS B CORE ANTIBODY, TOTAL: Hep B Core Total Ab: NONREACTIVE

## 2023-11-14 LAB — RPR: RPR Ser Ql: NONREACTIVE

## 2023-11-14 LAB — HIV ANTIBODY (ROUTINE TESTING W REFLEX): HIV 1&2 Ab, 4th Generation: NONREACTIVE

## 2023-11-16 ENCOUNTER — Telehealth: Payer: Self-pay

## 2023-11-16 NOTE — Telephone Encounter (Signed)
-----   Message from Neena Rhymes sent at 11/15/2023  5:02 PM EDT ----- Negative for all sexually transmitted diseases, yeast, and BV.  Great news!

## 2023-11-16 NOTE — Telephone Encounter (Signed)
 Pt has reviewed via MyChart

## 2023-11-25 ENCOUNTER — Other Ambulatory Visit: Payer: Self-pay | Admitting: Family Medicine

## 2023-11-25 DIAGNOSIS — E559 Vitamin D deficiency, unspecified: Secondary | ICD-10-CM

## 2023-11-25 DIAGNOSIS — F419 Anxiety disorder, unspecified: Secondary | ICD-10-CM

## 2023-11-26 ENCOUNTER — Ambulatory Visit: Admitting: "Endocrinology

## 2023-12-21 ENCOUNTER — Other Ambulatory Visit: Payer: Self-pay | Admitting: "Endocrinology

## 2023-12-21 ENCOUNTER — Other Ambulatory Visit: Payer: Self-pay | Admitting: Family Medicine

## 2023-12-21 DIAGNOSIS — F32A Depression, unspecified: Secondary | ICD-10-CM

## 2024-01-18 ENCOUNTER — Telehealth: Payer: Self-pay

## 2024-01-18 ENCOUNTER — Encounter: Payer: Self-pay | Admitting: Family Medicine

## 2024-01-18 ENCOUNTER — Ambulatory Visit (INDEPENDENT_AMBULATORY_CARE_PROVIDER_SITE_OTHER): Payer: BLUE CROSS/BLUE SHIELD | Admitting: Family Medicine

## 2024-01-18 DIAGNOSIS — N760 Acute vaginitis: Secondary | ICD-10-CM | POA: Diagnosis not present

## 2024-01-18 DIAGNOSIS — E282 Polycystic ovarian syndrome: Secondary | ICD-10-CM | POA: Diagnosis not present

## 2024-01-18 DIAGNOSIS — N912 Amenorrhea, unspecified: Secondary | ICD-10-CM

## 2024-01-18 LAB — CBC WITH DIFFERENTIAL/PLATELET
Basophils Absolute: 0.1 10*3/uL (ref 0.0–0.1)
Basophils Relative: 0.6 % (ref 0.0–3.0)
Eosinophils Absolute: 0.2 10*3/uL (ref 0.0–0.7)
Eosinophils Relative: 1.6 % (ref 0.0–5.0)
HCT: 40.6 % (ref 36.0–46.0)
Hemoglobin: 13.5 g/dL (ref 12.0–15.0)
Lymphocytes Relative: 23.5 % (ref 12.0–46.0)
Lymphs Abs: 2.4 10*3/uL (ref 0.7–4.0)
MCHC: 33.3 g/dL (ref 30.0–36.0)
MCV: 86.4 fl (ref 78.0–100.0)
Monocytes Absolute: 0.5 10*3/uL (ref 0.1–1.0)
Monocytes Relative: 5.5 % (ref 3.0–12.0)
Neutro Abs: 6.9 10*3/uL (ref 1.4–7.7)
Neutrophils Relative %: 68.8 % (ref 43.0–77.0)
Platelets: 268 10*3/uL (ref 150.0–400.0)
RBC: 4.7 Mil/uL (ref 3.87–5.11)
RDW: 12.5 % (ref 11.5–15.5)
WBC: 10 10*3/uL (ref 4.0–10.5)

## 2024-01-18 LAB — HEPATIC FUNCTION PANEL
ALT: 19 U/L (ref 0–35)
AST: 15 U/L (ref 0–37)
Albumin: 4 g/dL (ref 3.5–5.2)
Alkaline Phosphatase: 60 U/L (ref 39–117)
Bilirubin, Direct: 0.1 mg/dL (ref 0.0–0.3)
Total Bilirubin: 0.4 mg/dL (ref 0.2–1.2)
Total Protein: 7.4 g/dL (ref 6.0–8.3)

## 2024-01-18 LAB — LIPID PANEL
Cholesterol: 157 mg/dL (ref 0–200)
HDL: 39 mg/dL — ABNORMAL LOW (ref >39.00)
LDL Cholesterol: 50 mg/dL (ref 0–99)
NonHDL: 118.44
Total CHOL/HDL Ratio: 4
Triglycerides: 344 mg/dL — ABNORMAL HIGH (ref 0.0–149.0)
VLDL: 68.8 mg/dL — ABNORMAL HIGH (ref 0.0–40.0)

## 2024-01-18 LAB — BASIC METABOLIC PANEL WITH GFR
BUN: 10 mg/dL (ref 6–23)
CO2: 26 meq/L (ref 19–32)
Calcium: 8.7 mg/dL (ref 8.4–10.5)
Chloride: 103 meq/L (ref 96–112)
Creatinine, Ser: 0.75 mg/dL (ref 0.40–1.20)
GFR: 103.51 mL/min (ref >60.00)
Glucose, Bld: 116 mg/dL — ABNORMAL HIGH (ref 70–99)
Potassium: 3.9 meq/L (ref 3.5–5.1)
Sodium: 138 meq/L (ref 135–145)

## 2024-01-18 LAB — TSH: TSH: 3.27 u[IU]/mL (ref 0.35–5.50)

## 2024-01-18 LAB — HEMOGLOBIN A1C: Hgb A1c MFr Bld: 5.7 % (ref 4.6–6.5)

## 2024-01-18 LAB — VITAMIN D 25 HYDROXY (VIT D DEFICIENCY, FRACTURES): VITD: 24.09 ng/mL — ABNORMAL LOW (ref 30.00–100.00)

## 2024-01-18 MED ORDER — WEGOVY 0.25 MG/0.5ML ~~LOC~~ SOAJ: 0.2500 mg | SUBCUTANEOUS | 1 refills | Status: DC

## 2024-01-18 MED ORDER — FLUCONAZOLE 150 MG PO TABS: 150.0000 mg | ORAL_TABLET | Freq: Once | ORAL | 0 refills | Status: AC

## 2024-01-18 NOTE — Assessment & Plan Note (Signed)
 Ongoing issue.  Pt has not had a period in 2 yrs despite taking Metformin .  Would like a referral back to Fertility specialist to try and get periods back on track.  Referral placed.

## 2024-01-18 NOTE — Patient Instructions (Addendum)
 Follow up in 3 months to recheck weight loss progress We'll notify you of your lab results and make any changes if needed START the Diflucan for suspected yeast Once we get the Wegovy  approved, start the 0.25mg  dose weekly Continue to work on healthy diet and regular exercise- you got this! We'll call you to schedule w/ [facility] Call with any questions or concerns Hang in there!!!

## 2024-01-18 NOTE — Telephone Encounter (Signed)
 Pharmacy Patient Advocate Encounter   Received notification from CoverMyMeds that prior authorization for Wegovy  0.25MG /0.5ML auto-injectors is required/requested.   Insurance verification completed.   The patient is insured through [facility] .   Per test claim: PA required; PA submitted to above mentioned insurance via CoverMyMeds Key/confirmation #/EOC [id number] Status is pending

## 2024-01-18 NOTE — Progress Notes (Signed)
   Subjective:    Patient ID: Brittany Lindsey, female    DOB: [birth year], 33 y.o.   MRN: [id number]  HPI Obesity- pt has gained 7 lbs since early March.  BMI now 48.94.  pt is currently on Metformin  and Wellbutrin  and while both initially helped w/ weight and appetite, this has since leveled off.  Pt reports walking regularly.  Has tried to limit sugars and carbs.  Eating primarily veggies and protein.    Yeast vaginitis- pt reports increased itching, thick white vaginal d/c.   Review of Systems For ROS see HPI     Objective:   Physical Exam Vitals reviewed.  Constitutional:      General: She is not in acute distress.    Appearance: Normal appearance. She is well-developed. She is obese. She is not ill-appearing.  HENT:     Head: Normocephalic and atraumatic.  Eyes:     Conjunctiva/sclera: Conjunctivae normal.     Pupils: Pupils are equal, round, and reactive to light.  Neck:     Thyroid : No thyromegaly.  Cardiovascular:     Rate and Rhythm: Normal rate and regular rhythm.     Heart sounds: Normal heart sounds.  Pulmonary:     Effort: Pulmonary effort is normal. No respiratory distress.     Breath sounds: Normal breath sounds.  Abdominal:     General: There is no distension.     Palpations: Abdomen is soft.     Tenderness: There is no abdominal tenderness.  Musculoskeletal:     Cervical back: Normal range of motion and neck supple.     Right lower leg: No edema.     Left lower leg: No edema.  Lymphadenopathy:     Cervical: No cervical adenopathy.  Skin:    General: Skin is warm and dry.  Neurological:     General: No focal deficit present.     Mental Status: She is alert and oriented to person, place, and time.  Psychiatric:        Mood and Affect: Mood normal.        Behavior: Behavior normal.        Thought Content: Thought content normal.           Assessment & Plan:  PCOS/Amenorrhea- ongoing issue.  Pt reports no regular period in 2 yrs.  Would like to  go back to [facility] to get menstrual cycles on track.  Referral placed.  Yeast vaginitis- new.  Start Diflucan.

## 2024-01-18 NOTE — Assessment & Plan Note (Addendum)
 Deteriorated.  Pt has gained 7 lbs since March.  BMI now ~49.  She is on Wellbutrin  and Metformin  w/o difficulty but not losing weight.  She is frustrated as she feels she is eating better- limiting sugars and carbs and trying to talk regularly.  Since she is on Medicaid and [place] Medicaid approved use of GLP1 medications in August, will try and get this approved for her.  Pt expressed understanding and is in agreement w/ plan.

## 2024-01-19 ENCOUNTER — Other Ambulatory Visit: Payer: Self-pay

## 2024-01-19 ENCOUNTER — Ambulatory Visit: Payer: Self-pay | Admitting: Family Medicine

## 2024-01-19 MED ORDER — VITAMIN D (ERGOCALCIFEROL) 1.25 MG (50000 UNIT) PO CAPS: 50000.0000 [IU] | ORAL_CAPSULE | ORAL | 0 refills | Status: AC

## 2024-01-19 NOTE — Telephone Encounter (Signed)
-----   Message from Larissa Plowman, [place] sent at 01/19/2024  9:44 AM EDT ----- Unable to leave  for pt to call office or Via MyChart Vitamin D  has been sent in

## 2024-01-19 NOTE — Telephone Encounter (Signed)
-----   Message from Laymon Priest sent at 01/19/2024  8:50 AM EDT ----- Your Vit D is low.  Based on this, we need to start 50,000 units weekly x12 weeks in addition to daily OTC supplement of at least 2000 units.   Your triglycerides (fatty part of blood) are much higher than 6 months ago.  This will improve w/ healthy diet and regular exercise.  No need for medication at this time.  Remainder of labs look great!

## 2024-01-21 ENCOUNTER — Other Ambulatory Visit: Payer: Self-pay | Admitting: Family Medicine

## 2024-01-21 DIAGNOSIS — F32A Depression, unspecified: Secondary | ICD-10-CM

## 2024-01-22 ENCOUNTER — Encounter: Payer: Self-pay | Admitting: Family Medicine

## 2024-01-25 ENCOUNTER — Telehealth: Payer: Self-pay

## 2024-01-25 NOTE — Telephone Encounter (Signed)
 Pharmacy Patient Advocate Encounter   Received notification from CoverMyMeds that prior authorization for Wegovy  0.25MG /0.5ML auto-injectors is required/requested.   Insurance verification completed.   The patient is insured through [facility] .   Per test claim: PA required; PA submitted to above mentioned insurance via CoverMyMeds Key/confirmation #/EOC [facility] Status is pending

## 2024-01-26 ENCOUNTER — Other Ambulatory Visit (HOSPITAL_COMMUNITY): Payer: Self-pay

## 2024-01-27 ENCOUNTER — Other Ambulatory Visit (HOSPITAL_COMMUNITY): Payer: Self-pay

## 2024-02-03 ENCOUNTER — Ambulatory Visit (INDEPENDENT_AMBULATORY_CARE_PROVIDER_SITE_OTHER): Admitting: "Endocrinology

## 2024-02-03 ENCOUNTER — Telehealth: Payer: Self-pay

## 2024-02-03 ENCOUNTER — Encounter: Payer: Self-pay | Admitting: "Endocrinology

## 2024-02-03 DIAGNOSIS — R7303 Prediabetes: Secondary | ICD-10-CM

## 2024-02-03 DIAGNOSIS — E282 Polycystic ovarian syndrome: Secondary | ICD-10-CM | POA: Diagnosis not present

## 2024-02-03 DIAGNOSIS — E88819 Insulin resistance, unspecified: Secondary | ICD-10-CM

## 2024-02-03 MED ORDER — PHENTERMINE HCL 30 MG PO CAPS: 30.0000 mg | ORAL_CAPSULE | ORAL | 0 refills | Status: AC

## 2024-02-03 NOTE — Progress Notes (Signed)
 Outpatient Endocrinology Note Brittany Newcomer, MD    Patrick Sohm [birth year] [id number]  Referring Provider: Jess Morita, MD Primary Care Provider: Jess Morita, MD Reason for consultation: Subjective   Assessment & Plan  Diagnoses and all orders for this visit:  Morbid obesity (HCC)  PCOS (polycystic ovarian syndrome)  Insulin  resistance  Pre-diabetes   Diagnosed of PCOS since 2014 Metformin  1000 mg twice daily - now is well tolerating   On spironolactone  at 50 mg daily Patient reports it is working well for her, no known S/E   On Vit D 50,000 units every day for Vit D deficiency  Obesity complicated by insulin  resistance, new pre-diabetes as well as hyperlipidemia, current Body mass index is 48.75 kg/m. Lost 15 lbs on Wellbutrin  XL since 05/2023, starting weight 296 lbs, on higher dose now and feels good Open to GLP1 if covered by insurance, pt will find out; ozempic  denied, wegovy  tried by pcp  02/03/24: Start Phentermine  15 mg every day, after 1 mo if no benefit, increase it to 30 mg every day, no known contraindications, discussed S/E; ordered referral for bariatric surgery  Pt follows with a nutritionist Patient interested in GLP-1, No history of MEN syndrome/medullary thyroid  cancer/pancreatitis or pancreatic cancer in self or family Maintain healthy lifestyle including 1200 Cal/day, 30 min of activity/day, avoiding refined/processed/outside food 20 minutes physical activity per day, in continuum or interruptedly through the day  Goals: less than 60 grams of carbohydrate/meal, 1200-1500 Cal/day, 10,0000 steps a day and weight loss of 0.5-1 lb/ wk    Continue to follow-up with OB/GYN for menstrual cycle and [facility] for fertility issues  Return in about 3 months (around 05/05/2024).   I have reviewed current medications, nurse's notes, allergies, vital signs, past medical and surgical history, family medical history, and social  history for this encounter. Counseled patient on symptoms, examination findings, lab findings, imaging results, treatment decisions and monitoring and prognosis. The patient understood the recommendations and agrees with the treatment plan. All questions regarding treatment plan were fully answered.  Brittany Newcomer, MD  02/03/24   History of Present Illness HPI  Brittany Lindsey is a 33 y.o. year old female who presents for evaluation of PCOS. Brittany Lindsey was first diagnosed of PCOS in 2014.    Current regimen: Wellbutrin  XL 450 mg by pcp-been on wellbutrin  since 05/2023 Spironolactone  50 mg every day-started by Ob-gyn  Metformin  1000 mg bid y   Patient reports issues with menstrual cycle in th past Went to [facility] for ovulation  Was on metformin  but reports dizziness sweaty, shaking, was on 1000 mg bid  Started on spironolactone  25 mg by Ob-gyn  Gets facial hair waxed    Physical Exam  BP 118/80   Pulse 81   Ht 5\' 4"  (1.626 m)   Wt 284 lb (128.8 kg)   SpO2 98%   BMI 48.75 kg/m    Constitutional: well developed, well nourished, thick facial hair on upper lips and chin Head: normocephalic, atraumatic Eyes: sclera anicteric, no redness Neck: supple Lungs: normal respiratory effort Neurology: alert and oriented Skin: dry, no appreciable rashes Musculoskeletal: no appreciable defects Psychiatric: normal mood and affect   Current Medications Patient's Medications  New Prescriptions   No medications on file  Previous Medications   ALBUTEROL  (VENTOLIN  HFA) 108 (90 BASE) MCG/ACT INHALER    INHALE 2 PUFFS BY MOUTH INTO THE LUNGS EVERY 6 HOURS AS NEEDED FOR WHEEZING OR SHORTNESS OF BREATH   ALPRAZOLAM  (XANAX )  0.5 MG TABLET    Take 1 tablet (0.5 mg total) by mouth 2 (two) times daily as needed for anxiety.   BUDESONIDE -FORMOTEROL  (SYMBICORT ) 80-4.5 MCG/ACT INHALER    Inhale 2 puffs into the lungs 2 (two) times daily.   BUPROPION  (WELLBUTRIN  XL) 150 MG 24 HR  TABLET    Take 1 tablet (150 mg total) by mouth daily. In addition to the 300mg  for a total of 450mg  daily   BUPROPION  (WELLBUTRIN  XL) 300 MG 24 HR TABLET    TAKE ONE TABLET BY MOUTH ONCE A DAY   HYDROCORTISONE  (ANUSOL -HC) 25 MG SUPPOSITORY    Place 1 suppository (25 mg total) rectally 2 (two) times daily.   MELOXICAM  (MOBIC ) 15 MG TABLET    Take 1 tablet (15 mg total) by mouth daily.   METFORMIN  (GLUCOPHAGE -XR) 500 MG 24 HR TABLET    TAKE ONE TABLET BY MOUTH ONCE A DAY WITH BREAKFAST   METRONIDAZOLE  (METROGEL ) 1 % GEL    Apply topically daily.   POLYETHYLENE GLYCOL POWDER (GLYCOLAX /MIRALAX ) 17 GM/SCOOP POWDER    Take 17 g by mouth daily.   SEMAGLUTIDE -WEIGHT MANAGEMENT (WEGOVY ) 0.25 MG/0.5ML SOAJ    Inject 0.25 mg into the skin once a week.   SPIRONOLACTONE  (ALDACTONE ) 50 MG TABLET    TAKE ONE TABLET BY MOUTH ONCE A DAY   VITAMIN D , ERGOCALCIFEROL , (DRISDOL ) 1.25 MG (50000 UNIT) CAPS CAPSULE    Take 1 capsule (50,000 Units total) by mouth every 7 (seven) days.  Modified Medications   No medications on file  Discontinued Medications   No medications on file    Allergies Allergies  Allergen Reactions   Mushroom Extract Complex (Obsolete) Hives   Penicillins Hives    Has patient had a PCN reaction causing immediate rash, facial/tongue/throat swelling, SOB or lightheadedness with hypotension: No Has patient had a PCN reaction causing severe rash involving mucus membranes or skin necrosis: No-- pt had hives Has patient had a PCN reaction that required hospitalization: No Has patient had a PCN reaction occurring within the last 10 years: No If all of the above answers are "NO", then may proceed with Cephalosporin use.     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  Social History Social History   Socioeconomic History   Marital status: Single    Spouse name: Not on file   Number of children: Not on file   Years of education: Not on file   Highest education level: Associate degree: academic program  Occupational History   Not on file  Tobacco Use   Smoking status: Never  Smokeless tobacco: Never  Vaping Use   Vaping status: Never Used  Substance and Sexual Activity   Alcohol use: No   Drug use: No   Sexual activity: Yes    Birth control/protection: None  Other Topics Concern   Not on file  Social History Narrative   Not on file   Social Drivers of Health   Financial Resource Strain: Low Risk  (11/10/2023)   Overall Financial Resource Strain (CARDIA)    Difficulty of Paying Living Expenses: Not hard at all  Food Insecurity: No Food Insecurity (11/10/2023)   Hunger Vital Sign    Worried About Running Out of Food in the Last Year: Never true    Ran Out of Food in the Last Year: Never true  Transportation Lindsey: No Transportation Lindsey (11/10/2023)   PRAPARE - Administrator, Civil Service (Medical): No    Lack of Transportation (Non-Medical): No  Physical Activity: Insufficiently Active (11/10/2023)   Exercise Vital Sign     Days of Exercise per Week: 2 days    Minutes of Exercise per Session: 20 min  Stress: No Stress Concern Present (11/10/2023)   [employer] of Occupational Health - Occupational Stress Questionnaire    Feeling of Stress : Not at all  Social Connections: Socially Isolated (11/10/2023)   Social Connection and Isolation Panel [NHANES]    Frequency of Communication with Friends and Family: Once a week    Frequency of Social Gatherings with Friends and Family: Never    Attends Religious Services: Never    Database administrator or Organizations: No    Attends Engineer, structural: Not on file    Marital Status: Never married  Intimate Partner Violence: Unknown (03/07/2023)   Received from [facility]   HITS    Physically Hurt: Not on file    Insult or Talk Down To: Not on file    Threaten Physical Harm: Not on file    Scream or Curse: Not on file    Lab Results  Component Value Date   CHOL 157 01/18/2024   Lab Results  Component Value Date   HDL 39.00 (L) 01/18/2024   Lab Results  Component Value Date   LDLCALC 50 01/18/2024   Lab Results  Component Value Date   TRIG 344.0 (H) 01/18/2024   Lab Results  Component Value Date   CHOLHDL 4 01/18/2024   Lab Results  Component Value Date   CREATININE 0.75 01/18/2024   Lab Results  Component Value Date   GFR 103.51 01/18/2024      Component Value Date/Time   NA 138 01/18/2024 1004   K 3.9 01/18/2024 1004   CL 103 01/18/2024 1004   CO2 26 01/18/2024 1004   GLUCOSE 116 (H) 01/18/2024 1004   BUN 10 01/18/2024 1004   CREATININE 0.75 01/18/2024 1004   CREATININE 0.70 03/30/2015 1441   CALCIUM  8.7 01/18/2024 1004   PROT 7.4 01/18/2024 1004   ALBUMIN 4.0 01/18/2024 1004   AST 15 01/18/2024 1004   ALT 19 01/18/2024 1004   ALKPHOS 60 01/18/2024 1004   BILITOT 0.4 01/18/2024 1004   GFRNONAA >60 11/13/2017 1525   GFRAA >60 11/13/2017 1525      Latest Ref Rng & Units 01/18/2024   10:04 AM 07/21/2023   11:25  AM 04/06/2023    9:51 AM  BMP  Glucose 70 - 99 mg/dL 540  981  191   BUN 6 - 23 mg/dL 10  8  10   Creatinine 0.40 - 1.20 mg/dL 1.61  0.96  0.45   Sodium 135 - 145 mEq/L 138  139  137   Potassium 3.5 - 5.1 mEq/L 3.9  3.7  3.3   Chloride 96 - 112 mEq/L 103  107  103   CO2 19 - 32 mEq/L 26  24  25    Calcium  8.4 - 10.5 mg/dL 8.7  8.7  9.0        Component Value Date/Time   WBC 10.0 01/18/2024 1004   RBC 4.70 01/18/2024 1004   HGB 13.5 01/18/2024 1004   HCT 40.6 01/18/2024 1004   PLT 268.0 01/18/2024 1004   MCV 86.4 01/18/2024 1004   MCH 28.3 11/13/2017 1525   MCHC 33.3 01/18/2024 1004   RDW 12.5 01/18/2024 1004   LYMPHSABS 2.4 01/18/2024 1004   MONOABS 0.5 01/18/2024 1004   EOSABS 0.2 01/18/2024 1004   BASOSABS 0.1 01/18/2024 1004   Lab Results  Component Value Date   TSH 3.27 01/18/2024   TSH 2.22 07/21/2023   TSH 2.53 04/06/2023   FREET4 0.81 01/19/2023   FREET4 0.73 01/29/2022         Parts of this note may have been dictated using voice recognition software. There may be variances in spelling and vocabulary which are unintentional. Not all errors are proofread. Please notify the [facility] if any discrepancies are noted or if the meaning of any statement is not clear.

## 2024-02-03 NOTE — Telephone Encounter (Signed)
 Pharmacy Patient Advocate Encounter   Received notification from CoverMyMeds that prior authorization for Wegovy  0.25MG /0.5ML auto-injectors is required/requested.   Insurance verification completed.   The patient is insured through [facility] .   Per test claim: PA required; PA submitted to above mentioned insurance via CoverMyMeds Key/confirmation #/EOC [id number] Status is pending

## 2024-02-03 NOTE — Patient Instructions (Signed)
 Pt interested in weight loss Discussed lifestyle changes, medical management as well as bariatric surgery Maintain healthy lifestyle including 1200 Cal/day, 30 min of activity/day, avoiding refined/processed/outside food 20 minutes physical activity per day, in continuum or interruptedly through the day  Goals: less than 60 grams of carbohydrate/meal, 1200-1500 Cal/day, 10,0000 steps a day and weight loss of 0.5-1 lb/ wk  Sleep 7-9 hours/day, adapt good sleep hygiene Adapt de-stressing and relaxation techniques to prevent stress induced weight gain Avoid/switch medications that lead to weight gain by discussing with the prescribing physician

## 2024-02-03 NOTE — Telephone Encounter (Signed)
 Prior Auth expired. New Key.

## 2024-02-06 ENCOUNTER — Encounter: Payer: Self-pay | Admitting: "Endocrinology

## 2024-02-08 ENCOUNTER — Encounter: Payer: Self-pay | Admitting: Family Medicine

## 2024-02-08 NOTE — Telephone Encounter (Signed)
Called pt was unable to lvm.

## 2024-02-09 ENCOUNTER — Telehealth: Payer: Self-pay

## 2024-02-09 ENCOUNTER — Other Ambulatory Visit (HOSPITAL_COMMUNITY): Payer: Self-pay

## 2024-02-09 NOTE — Telephone Encounter (Signed)
 duplicate

## 2024-02-09 NOTE — Telephone Encounter (Signed)
 Patient is wondering if you can prescribed the Adderall due to not hearing from [facility]?   Looking in her chart I did not see a referral for this specialty

## 2024-02-09 NOTE — Telephone Encounter (Signed)
 Pt reports she does not have any other insurance  Pt is going to call pharmacy and insurance ([employer]) and she will let us  know if she has any further concerns

## 2024-02-09 NOTE — Telephone Encounter (Signed)
 Pharmacy Patient Advocate Encounter  Received notification from [employer] that Prior Authorization for Wegovy  0.25MG /0.5ML auto-injectors has been APPROVED from 02/04/2024 to 08/06/2024   PA #/Case ID/Reference #: [id number]  I TRIED TO RUN A TEST CLAIM AFTER APPROVAL, BUT PER REJECTION. PATIENT HAS A PRIMARY PLAN LISTED BEFORE [employer] PLAN AND [employer] IS REQUIRING THEM TO PAY BEFORE THEY WILL COVER ANY AMOUNT. IF PATIENT NO LONGER HAS PRIMARY PLAN THEY WILL NEED TO HAVE THAT REMOVED AND [employer] LISTED AS PRIMARY.

## 2024-02-16 ENCOUNTER — Other Ambulatory Visit (HOSPITAL_COMMUNITY): Payer: Self-pay

## 2024-02-18 ENCOUNTER — Ambulatory Visit: Admitting: Family Medicine

## 2024-02-23 ENCOUNTER — Ambulatory Visit: Admitting: Family Medicine

## 2024-02-26 ENCOUNTER — Encounter: Payer: Self-pay | Admitting: Family Medicine

## 2024-02-26 ENCOUNTER — Ambulatory Visit (INDEPENDENT_AMBULATORY_CARE_PROVIDER_SITE_OTHER): Admitting: Family Medicine

## 2024-02-26 VITALS — BP 128/72 | HR 78 | Temp 98.2°F | Resp 16 | Ht 64.0 in | Wt 283.0 lb

## 2024-02-26 DIAGNOSIS — Z202 Contact with and (suspected) exposure to infections with a predominantly sexual mode of transmission: Secondary | ICD-10-CM

## 2024-02-26 DIAGNOSIS — E282 Polycystic ovarian syndrome: Secondary | ICD-10-CM | POA: Diagnosis not present

## 2024-02-26 LAB — CORTISOL: Cortisol, Plasma: 7.4 ug/dL

## 2024-02-26 NOTE — Patient Instructions (Signed)
 Follow up as needed or as scheduled We'll notify you of your lab results and make any changes if needed Call and schedule with GYN to ask about the progesterone  and PCOS Call with any questions or concerns Stay Safe!  Stay Healthy! Happy Belated Birthday!!!

## 2024-02-26 NOTE — Progress Notes (Unsigned)
   Subjective:    Patient ID: Brittany Lindsey, female    DOB: 07/17/1991, 33 y.o.   MRN: [id number]  HPI Possible STD exposure- no sxs, no specific concerns.  She prefers to be safe and check regularly.  Feels safe in relationship.  PCOS- pt is interested in cortisol level.  Is asking about progesterone  supplements.  Has not talked w/ GYN.   Review of Systems For ROS see HPI     Objective:   Physical Exam Vitals reviewed.  Constitutional:      General: She is not in acute distress.    Appearance: Normal appearance. She is obese. She is not ill-appearing.  HENT:     Head: Normocephalic and atraumatic.   Eyes:     Extraocular Movements: Extraocular movements intact.     Conjunctiva/sclera: Conjunctivae normal.    Skin:    General: Skin is warm and dry.   Neurological:     General: No focal deficit present.     Mental Status: She is alert and oriented to person, place, and time.   Psychiatric:        Mood and Affect: Mood normal.        Behavior: Behavior normal.        Thought Content: Thought content normal.           Assessment & Plan:  Possible STD exposure- pt routinely gets checked even if asymptomatic or w/o known exposure.  Is again asymptomatic.  Encouraged condoms w/ each sexual encounter.  Will tx if needed

## 2024-02-27 ENCOUNTER — Encounter: Payer: Self-pay | Admitting: Family Medicine

## 2024-02-27 LAB — RPR: RPR Ser Ql: NONREACTIVE

## 2024-02-27 LAB — HIV ANTIBODY (ROUTINE TESTING W REFLEX): HIV 1&2 Ab, 4th Generation: NONREACTIVE

## 2024-02-29 NOTE — Telephone Encounter (Signed)
 Patient is questioning if she can pass on Hep B even if she is immune to it?

## 2024-03-01 ENCOUNTER — Ambulatory Visit: Payer: Self-pay | Admitting: Family Medicine

## 2024-03-01 DIAGNOSIS — Z202 Contact with and (suspected) exposure to infections with a predominantly sexual mode of transmission: Secondary | ICD-10-CM

## 2024-03-01 NOTE — Assessment & Plan Note (Signed)
 Will check random cortisol level at pt's request.  Encouraged her to discuss sxs and tx w/ GYN or Endo.  Pt expressed understanding and is in agreement w/ plan.

## 2024-04-14 ENCOUNTER — Ambulatory Visit (INDEPENDENT_AMBULATORY_CARE_PROVIDER_SITE_OTHER): Admitting: Family Medicine

## 2024-04-14 ENCOUNTER — Encounter: Payer: Self-pay | Admitting: Family Medicine

## 2024-04-14 VITALS — BP 112/72 | HR 90 | Temp 98.1°F | Ht 64.0 in | Wt 274.4 lb

## 2024-04-14 DIAGNOSIS — F32A Depression, unspecified: Secondary | ICD-10-CM | POA: Diagnosis not present

## 2024-04-14 DIAGNOSIS — F419 Anxiety disorder, unspecified: Secondary | ICD-10-CM | POA: Diagnosis not present

## 2024-04-14 DIAGNOSIS — N644 Mastodynia: Secondary | ICD-10-CM

## 2024-04-14 DIAGNOSIS — Z0279 Encounter for issue of other medical certificate: Secondary | ICD-10-CM

## 2024-04-14 MED ORDER — GABAPENTIN 100 MG PO CAPS
100.0000 mg | ORAL_CAPSULE | Freq: Three times a day (TID) | ORAL | 3 refills | Status: AC | PRN
Start: 2024-04-14 — End: ?

## 2024-04-14 NOTE — Progress Notes (Signed)
   Subjective:    Patient ID: Brittany Lindsey, female    DOB: 28-Jan-1991, 33 y.o.   MRN: [id number]  HPI Obesity- pt is down 10 lbs today.  BMI now 47.  Currently on Phentermine .  Denies palpitations, CP, SOB.  Will have occasional HA's.  Not drinking much water.  Anxiety/depression- pt currently has 2 dogs.  These provide emotional support and help her manage her anxiety and PTSD.  These dogs have never had an issue w/ aggression or biting.  UTD on vaccines.  Pt is relocating to [facility] and apartment company is asking for letter regarding dogs.  Breast pain- new to provider, recurrent problem for pt.  Reports she will have a burning pain that starts under her nipple and then extend.  Pain will resolve spontaneously but this is very uncomfortable for pt.  Denies masses, skin changes, nipple d/c.  Has mentioned this to GYN and was told to limit caffeine and take ibuprofen .   Review of Systems For ROS see HPI     Objective:   Physical Exam Vitals reviewed.  Constitutional:      General: She is not in acute distress.    Appearance: Normal appearance. She is well-developed. She is obese. She is not ill-appearing.  HENT:     Head: Normocephalic and atraumatic.  Eyes:     Conjunctiva/sclera: Conjunctivae normal.     Pupils: Pupils are equal, round, and reactive to light.  Neck:     Thyroid : No thyromegaly.  Cardiovascular:     Rate and Rhythm: Normal rate and regular rhythm.     Heart sounds: Normal heart sounds. No murmur heard. Pulmonary:     Effort: Pulmonary effort is normal. No respiratory distress.     Breath sounds: Normal breath sounds.  Chest:  Breasts:    Right: Normal. No mass, nipple discharge, skin change or tenderness.     Left: Normal. No mass, nipple discharge, skin change or tenderness.  Abdominal:     General: There is no distension.     Palpations: Abdomen is soft.     Tenderness: There is no abdominal tenderness.  Musculoskeletal:     Cervical back: Normal range of  motion and neck supple.  Lymphadenopathy:     Cervical: No cervical adenopathy.  Skin:    General: Skin is warm and dry.  Neurological:     General: No focal deficit present.     Mental Status: She is alert and oriented to person, place, and time.  Psychiatric:        Mood and Affect: Mood normal.        Behavior: Behavior normal.        Thought Content: Thought content normal.           Assessment & Plan:

## 2024-04-14 NOTE — Patient Instructions (Signed)
 Follow up as needed or as scheduled START the Gabapentin  as needed for pain Keep up the good work on healthy diet and regular exercise- you're down 10 lbs!! Increase your water intake- especially in the heat! Call with any questions or concerns GOOD LUCK WITH THE MOVE!!!

## 2024-04-15 ENCOUNTER — Telehealth: Payer: Self-pay

## 2024-04-15 NOTE — Telephone Encounter (Signed)
 Forms signed and returned to [employer]

## 2024-04-15 NOTE — Telephone Encounter (Signed)
 Received form from [employer]. Needing provider signature, placed form in bin at front desk

## 2024-04-15 NOTE — Telephone Encounter (Signed)
 Placed in folder at nurse station

## 2024-04-18 NOTE — Telephone Encounter (Signed)
 Faxed back to [phone]

## 2024-04-20 ENCOUNTER — Ambulatory Visit: Admitting: Family Medicine

## 2024-04-25 ENCOUNTER — Ambulatory Visit: Admitting: Dermatology

## 2024-05-02 ENCOUNTER — Ambulatory Visit: Admitting: Family Medicine

## 2024-05-03 ENCOUNTER — Ambulatory Visit: Payer: Self-pay

## 2024-05-03 NOTE — Telephone Encounter (Signed)
 FYI Only or Action Required?: FYI only for provider.  Patient was last seen in primary care on 04/14/2024 by Mahlon Comer BRAVO, MD.  Called Nurse Triage reporting Breathing Problem.  Symptoms began 1.5 years ago has been increasing the past 1-2 weeks. Feels like something in her throat.  Interventions attempted: Nothing.  Symptoms are: gradually worsening.  Triage Disposition: See PCP Within 2 Weeks  Patient/caregiver understands and will follow disposition?: Yes                       Joshua Badder, RN    05/03/24 11:51 AM Note This RN received call from [facility] requesting for patient to be triaged due to concerning symptoms listed in 8/27 appt notes.  Rn placed call to number on file, no answer, LVMTCB #[phone]. Will route to call back basket for further attempts.     Joshua Badder, RN to Salene, Mohamud     05/03/24 11:47 AM No answer Reason for Disposition  [1] MILD longstanding difficulty breathing (e.g., minimal/no SOB at rest, SOB with walking, pulse < 100) AND [2] SAME as normal  Answer Assessment - Initial Assessment Questions Pt returned call. She states that she sometimes has difficulty breathing. She feels like there is something in her throat, that stops her from being able to breath in sometimes. This mostly happens at night while sleeping, but has the feeling while awake as well. Mother called EMS for her about 1.5 years ago for this. Pt noticed that this has been increasing in the past 1.5 -  2 weeks. Pt can't sleep in a more sitting up position. Pt has been seen for sleep apnea, but was unable to wear a mask. Mother will monitor for tonight, and pt will set her phone to call EMS with 1 button.       1. RESPIRATORY STATUS: Describe your breathing? (e.g., wheezing, shortness of breath, unable to speak, severe coughing)      Something in her throat - can't breath in well 2. ONSET: When did this breathing problem begin?       A few weeks 3. PATTERN Does the difficult breathing come and go, or has it been constant since it started?      Comes and goes 4. SEVERITY: How bad is your breathing? (e.g., mild, moderate, severe)      Mild - only in some positions 5. RECURRENT SYMPTOM: Have you had difficulty breathing before? If Yes, ask: When was the last time? and What happened that time?      yes  8. CAUSE: What do you think is causing the breathing problem?      Something in her throat?  Protocols used: Breathing Difficulty-A-AH

## 2024-05-03 NOTE — Telephone Encounter (Signed)
 Patient has appointment with you tomorrow to discuss concerns.

## 2024-05-03 NOTE — Telephone Encounter (Signed)
 This RN received call from [facility] requesting for patient to be triaged due to concerning symptoms listed in 8/27 appt notes.  Rn placed call to number on file, no answer, LVMTCB #[phone]. Will route to call back basket for further attempts.

## 2024-05-04 ENCOUNTER — Encounter: Payer: Self-pay | Admitting: Family Medicine

## 2024-05-04 ENCOUNTER — Ambulatory Visit (INDEPENDENT_AMBULATORY_CARE_PROVIDER_SITE_OTHER): Admitting: Family Medicine

## 2024-05-04 VITALS — BP 110/70 | HR 94 | Temp 97.8°F | Ht 64.0 in | Wt 272.2 lb

## 2024-05-04 DIAGNOSIS — J988 Other specified respiratory disorders: Secondary | ICD-10-CM | POA: Diagnosis not present

## 2024-05-04 DIAGNOSIS — Z202 Contact with and (suspected) exposure to infections with a predominantly sexual mode of transmission: Secondary | ICD-10-CM | POA: Diagnosis not present

## 2024-05-04 NOTE — Patient Instructions (Addendum)
 Follow up as needed or as scheduled We'll notify you of your lab results and make any changes if needed We'll call you to schedule your ENT appt ASAP In the meantime, try and OTC Bite Guard (for grinding teeth) or similar mouthpiece to see if that helps keep your airway open Call with any questions or concerns Hang in there!

## 2024-05-04 NOTE — Progress Notes (Signed)
   Subjective:    Patient ID: Brittany Lindsey, female    DOB: 03/03/1991, 33 y.o.   MRN: [id number]  HPI Breathing problems- sxs started ~2 weeks ago.  Initially thought it was allergy related.  Reports sxs are worse when she lies down- 'like my airway is constricted'.  Has sensation that airway is blocked and she needs to re-position.  Feels like there is something in the back of her throat but is not able to clear her throat or cough it out.  Does have known mild OSA but is not able to tolerate the mask due to claustrophobia/anxiety which causes worsening SOB.    STD exposure- routine screen per pt.  No current sxs.  Review of Systems For ROS see HPI     Objective:   Physical Exam Vitals reviewed.  Constitutional:      General: She is not in acute distress.    Appearance: Normal appearance. She is not ill-appearing.  HENT:     Head: Normocephalic and atraumatic.     Mouth/Throat:     Mouth: Mucous membranes are moist.     Comments: Flat palatal arch w/ soft tissue crowding Eyes:     Extraocular Movements: Extraocular movements intact.     Conjunctiva/sclera: Conjunctivae normal.  Cardiovascular:     Rate and Rhythm: Normal rate and regular rhythm.  Pulmonary:     Effort: Pulmonary effort is normal. No respiratory distress.  Musculoskeletal:     Cervical back: Normal range of motion and neck supple.  Skin:    General: Skin is warm and dry.  Neurological:     General: No focal deficit present.     Mental Status: She is alert and oriented to person, place, and time.  Psychiatric:        Mood and Affect: Mood normal.        Behavior: Behavior normal.           Assessment & Plan:  Airway obstruction- new.  Pt reports she has had sxs previously but they seem to have worsened in the past 2 weeks.  She is describing positional airway collapse- likely due to redundant or excessive soft tissues in her throat/neck.  Encouraged her to look at OTC bite guards for better jaw  positioning.  Will refer to ENT ASAP.  Pt expressed understanding and is in agreement w/ plan.   Possible exposure to STD- no concerns.  Just likes to be periodically checked.  Encouraged condoms w/ each sexual encounter.

## 2024-05-05 ENCOUNTER — Ambulatory Visit: Admitting: "Endocrinology

## 2024-05-05 LAB — SURESWAB® ADVANCED VAGINITIS PLUS,TMA
C. trachomatis RNA, TMA: NOT DETECTED
CANDIDA SPECIES: NOT DETECTED
Candida glabrata: NOT DETECTED
N. gonorrhoeae RNA, TMA: NOT DETECTED
SURESWAB(R) ADV BACTERIAL VAGINOSIS(BV),TMA: NEGATIVE
TRICHOMONAS VAGINALIS (TV),TMA: NOT DETECTED

## 2024-05-05 LAB — HIV ANTIBODY (ROUTINE TESTING W REFLEX): HIV 1&2 Ab, 4th Generation: NONREACTIVE

## 2024-05-05 LAB — RPR: RPR Ser Ql: NONREACTIVE

## 2024-05-06 ENCOUNTER — Ambulatory Visit: Payer: Self-pay | Admitting: Family Medicine

## 2024-05-08 NOTE — Assessment & Plan Note (Signed)
 Improving.  Pt is down 10 lbs since last visit.  Currently taking phentermine .  Denies palpitations, insomnia, worsening anxiety.  Will continue to follow.

## 2024-05-08 NOTE — Assessment & Plan Note (Signed)
 Ongoing issue.  Letter provided for pt regarding her 2 dogs that provide emotional support as she is relocating to [facility] and needs this for her apartment.

## 2024-05-08 NOTE — Assessment & Plan Note (Signed)
 Recurrent problem for pt.  The burning pain she's describing sounds like it could be nerve pain.  Will start gabapentin  and see if sxs improve.  Pt expressed understanding and is in agreement w/ plan.

## 2024-05-19 ENCOUNTER — Institutional Professional Consult (permissible substitution) (INDEPENDENT_AMBULATORY_CARE_PROVIDER_SITE_OTHER): Admitting: Otolaryngology

## 2024-05-19 NOTE — Progress Notes (Deleted)
 Dear Dr. Mahlon, Here is my assessment for our mutual patient, Brittany Lindsey. Thank you for allowing me the opportunity to care for your patient. Please do not hesitate to contact me should you have any other questions. Sincerely, Dr. Eldora Blanch  Otolaryngology Clinic Note Referring provider: Dr. Mahlon HPI:  Brittany Lindsey is a 33 y.o. female kindly referred by Dr. Mahlon for evaluation of ***.   H&N Surgery: *** Personal or FHx of bleeding dz or anesthesia difficulty: no ***  GLP-1: *** AP/AC: ***  Tobacco: ***. Alcohol: ***. Occupation: ***. Lives in *** with ***.  PMHx:   Independent Review of Additional Tests or Records:  Dr. Mahlon (05/04/2024): noted breathing problems, worse when she lies down (like my airway is constricted). Needs to reposition; cannot clear throat or cough it out; known OSA, cannot tolerate mask. Dx: Airway obstruction; Rx: ref to ENT   PMH/Meds/All/SocHx/FamHx/ROS:   Past Medical History:  Diagnosis Date   PCOS (polycystic ovarian syndrome)    Scoliosis    Tubo-ovarian abscess Feb 2016     Past Surgical History:  Procedure Laterality Date   CHROMOPERTUBATION N/A 11/02/2015   Procedure: CHROMOPERTUBATION;  Surgeon: Nena App, MD;  Location: [facility];  Service: Gynecology;  Laterality: N/A;   LAPAROSCOPY Right 11/02/2015   Procedure: LAPAROSCOPY OPERATIVE with Cure of Hydrosalpynx  with Right Salpingectomy;  Surgeon: Nena App, MD;  Location: [facility];  Service: Gynecology;  Laterality: Right;   LYSIS OF ADHESION N/A 11/02/2015   Procedure: EXTENSIVE LYSIS OF ADHESION;  Surgeon: Nena App, MD;  Location: [facility];  Service: Gynecology;  Laterality: N/A;    Family History  Problem Relation Age of Onset   Diabetes Mother    Seizures Mother    Hyperlipidemia Mother    Diabetes Father    Hypertension Father    Thyroid  disease Maternal Grandmother    Lupus Other      Social Connections: Socially Isolated (11/10/2023)   Social  Connection and Isolation Panel    Frequency of Communication with Friends and Family: Once a week    Frequency of Social Gatherings with Friends and Family: Never    Attends Religious Services: Never    Database administrator or Organizations: No    Attends Engineer, structural: Not on file    Marital Status: Never married      Current Outpatient Medications:    albuterol  (VENTOLIN  HFA) 108 (90 Base) MCG/ACT inhaler, INHALE 2 PUFFS BY MOUTH INTO THE LUNGS EVERY 6 HOURS AS NEEDED FOR WHEEZING OR SHORTNESS OF BREATH, Disp: 6.7 g, Rfl: 0   ALPRAZolam  (XANAX ) 0.5 MG tablet, Take 1 tablet (0.5 mg total) by mouth 2 (two) times daily as needed for anxiety., Disp: 15 tablet, Rfl: 0   budesonide -formoterol  (SYMBICORT ) 80-4.5 MCG/ACT inhaler, Inhale 2 puffs into the lungs 2 (two) times daily., Disp: 1 each, Rfl: 5   buPROPion  (WELLBUTRIN  XL) 150 MG 24 hr tablet, Take 1 tablet (150 mg total) by mouth daily. In addition to the 300mg  for a total of 450mg  daily, Disp: 30 tablet, Rfl: 0   buPROPion  (WELLBUTRIN  XL) 300 MG 24 hr tablet, TAKE ONE TABLET BY MOUTH ONCE A DAY, Disp: 30 tablet, Rfl: 0   gabapentin  (NEURONTIN ) 100 MG capsule, Take 1 capsule (100 mg total) by mouth 3 (three) times daily as needed., Disp: 90 capsule, Rfl: 3   hydrocortisone  (ANUSOL -HC) 25 MG suppository, Place 1 suppository (25 mg total) rectally 2 (two) times daily., Disp: 12 suppository, Rfl:  0   meloxicam  (MOBIC ) 15 MG tablet, Take 1 tablet (15 mg total) by mouth daily., Disp: 30 tablet, Rfl: 3   metFORMIN  (GLUCOPHAGE -XR) 500 MG 24 hr tablet, TAKE ONE TABLET BY MOUTH ONCE A DAY WITH BREAKFAST, Disp: 90 tablet, Rfl: 0   metroNIDAZOLE  (METROGEL ) 1 % gel, Apply topically daily., Disp: 60 g, Rfl: 1   phentermine  30 MG capsule, Take 1 capsule (30 mg total) by mouth every morning., Disp: 90 capsule, Rfl: 0   polyethylene glycol powder (GLYCOLAX /MIRALAX ) 17 GM/SCOOP powder, Take 17 g by mouth daily., Disp: 850 g, Rfl: 1    spironolactone  (ALDACTONE ) 50 MG tablet, TAKE ONE TABLET BY MOUTH ONCE A DAY, Disp: 30 tablet, Rfl: 0   Vitamin D , Ergocalciferol , (DRISDOL ) 1.25 MG (50000 UNIT) CAPS capsule, Take 1 capsule (50,000 Units total) by mouth every 7 (seven) days., Disp: 12 capsule, Rfl: 0   Physical Exam:   There were no vitals taken for this visit.  Salient findings:  CN II-XII intact *** Bilateral EAC clear and TM intact with well pneumatized middle ear spaces Weber 512: *** Rinne 512: AC > BC b/l *** Rine 1024: AC > BC b/l *** Anterior rhinoscopy: Septum ***; bilateral inferior turbinates with *** No lesions of oral cavity/oropharynx; dentition *** No obviously palpable neck masses/lymphadenopathy/thyromegaly No respiratory distress or stridor***  Seprately Identifiable Procedures:  Prior to initiating any procedures, risks/benefits/alternatives were explained to the patient and verbal consent obtained. None***  Impression & Plans:  Brittany Lindsey is a 33 y.o. female with ***  No diagnosis found.   - f/u ***  See below regarding exact medications prescribed this encounter including dosages and route: No orders of the defined types were placed in this encounter.     Thank you for allowing me the opportunity to care for your patient. Please do not hesitate to contact me should you have any other questions.  Sincerely, Eldora Blanch, MD Otolaryngologist (ENT), [facility] Phone: [phone] Fax: [phone]  05/19/2024, 2:46 PM   MDM:  Level *** Complexity/Problems addressed: *** Data complexity: *** independent review of *** - Morbidity: ***  - Prescription Drug prescribed or managed: ***

## 2024-06-30 ENCOUNTER — Encounter (INDEPENDENT_AMBULATORY_CARE_PROVIDER_SITE_OTHER): Payer: Self-pay

## 2024-06-30 ENCOUNTER — Ambulatory Visit: Admitting: "Endocrinology

## 2024-07-08 ENCOUNTER — Other Ambulatory Visit (HOSPITAL_COMMUNITY): Payer: Self-pay
